# Patient Record
Sex: Female | Born: 1970 | Race: White | Hispanic: Yes | Smoking: Never smoker
Health system: Southern US, Community
[De-identification: ages and names within clinical notes are randomized; demographics above are authoritative.]

## PROBLEM LIST (undated history)

## (undated) DIAGNOSIS — I10 Essential (primary) hypertension: Secondary | ICD-10-CM

## (undated) DIAGNOSIS — G43909 Migraine, unspecified, not intractable, without status migrainosus: Secondary | ICD-10-CM

## (undated) HISTORY — PX: APPENDECTOMY: SHX54

## (undated) HISTORY — PX: BREAST ENHANCEMENT SURGERY: SHX7

## (undated) HISTORY — PX: DILATION AND CURETTAGE OF UTERUS: SHX78

## (undated) HISTORY — PX: EYE SURGERY: SHX253

## (undated) HISTORY — DX: Migraine, unspecified, not intractable, without status migrainosus: G43.909

---

## 1997-07-19 ENCOUNTER — Inpatient Hospital Stay (HOSPITAL_COMMUNITY): Admission: AD | Admit: 1997-07-19 | Discharge: 1997-07-23 | Payer: Self-pay | Admitting: Obstetrics & Gynecology

## 1997-07-29 ENCOUNTER — Ambulatory Visit (HOSPITAL_COMMUNITY): Admission: RE | Admit: 1997-07-29 | Discharge: 1997-07-29 | Payer: Self-pay | Admitting: Obstetrics & Gynecology

## 1997-10-31 ENCOUNTER — Inpatient Hospital Stay (HOSPITAL_COMMUNITY): Admission: AD | Admit: 1997-10-31 | Discharge: 1997-11-03 | Payer: Self-pay | Admitting: Obstetrics and Gynecology

## 1999-01-16 ENCOUNTER — Inpatient Hospital Stay (HOSPITAL_COMMUNITY): Admission: AD | Admit: 1999-01-16 | Discharge: 1999-01-19 | Payer: Self-pay | Admitting: Obstetrics and Gynecology

## 1999-01-16 ENCOUNTER — Encounter: Payer: Self-pay | Admitting: Obstetrics & Gynecology

## 1999-01-26 ENCOUNTER — Encounter: Admission: RE | Admit: 1999-01-26 | Discharge: 1999-01-26 | Payer: Self-pay | Admitting: Obstetrics & Gynecology

## 1999-02-03 ENCOUNTER — Encounter: Admission: RE | Admit: 1999-02-03 | Discharge: 1999-02-03 | Payer: Self-pay | Admitting: Obstetrics

## 1999-02-17 ENCOUNTER — Encounter: Admission: RE | Admit: 1999-02-17 | Discharge: 1999-02-17 | Payer: Self-pay | Admitting: Obstetrics

## 1999-03-03 ENCOUNTER — Encounter: Admission: RE | Admit: 1999-03-03 | Discharge: 1999-03-03 | Payer: Self-pay | Admitting: Obstetrics

## 1999-03-17 ENCOUNTER — Encounter: Admission: RE | Admit: 1999-03-17 | Discharge: 1999-03-17 | Payer: Self-pay | Admitting: Obstetrics

## 1999-03-31 ENCOUNTER — Encounter: Admission: RE | Admit: 1999-03-31 | Discharge: 1999-03-31 | Payer: Self-pay | Admitting: Obstetrics

## 1999-04-06 ENCOUNTER — Ambulatory Visit (HOSPITAL_COMMUNITY): Admission: RE | Admit: 1999-04-06 | Discharge: 1999-04-06 | Payer: Self-pay | Admitting: *Deleted

## 1999-04-14 ENCOUNTER — Encounter: Admission: RE | Admit: 1999-04-14 | Discharge: 1999-04-14 | Payer: Self-pay | Admitting: Obstetrics

## 1999-05-04 ENCOUNTER — Encounter: Admission: RE | Admit: 1999-05-04 | Discharge: 1999-05-04 | Payer: Self-pay | Admitting: Obstetrics & Gynecology

## 1999-05-04 ENCOUNTER — Encounter (HOSPITAL_COMMUNITY): Admission: RE | Admit: 1999-05-04 | Discharge: 1999-05-26 | Payer: Self-pay | Admitting: Obstetrics & Gynecology

## 1999-05-06 ENCOUNTER — Encounter: Admission: RE | Admit: 1999-05-06 | Discharge: 1999-05-06 | Payer: Self-pay | Admitting: Internal Medicine

## 1999-05-11 ENCOUNTER — Encounter: Admission: RE | Admit: 1999-05-11 | Discharge: 1999-05-11 | Payer: Self-pay | Admitting: Obstetrics & Gynecology

## 1999-05-17 ENCOUNTER — Inpatient Hospital Stay (HOSPITAL_COMMUNITY): Admission: AD | Admit: 1999-05-17 | Discharge: 1999-05-17 | Payer: Self-pay | Admitting: *Deleted

## 1999-05-26 ENCOUNTER — Inpatient Hospital Stay (HOSPITAL_COMMUNITY): Admission: AD | Admit: 1999-05-26 | Discharge: 1999-05-29 | Payer: Self-pay | Admitting: Obstetrics & Gynecology

## 1999-05-26 ENCOUNTER — Encounter: Admission: RE | Admit: 1999-05-26 | Discharge: 1999-05-26 | Payer: Self-pay | Admitting: Obstetrics

## 1999-06-01 ENCOUNTER — Observation Stay (HOSPITAL_COMMUNITY): Admission: AD | Admit: 1999-06-01 | Discharge: 1999-06-02 | Payer: Self-pay | Admitting: Obstetrics

## 1999-07-13 ENCOUNTER — Other Ambulatory Visit: Admission: RE | Admit: 1999-07-13 | Discharge: 1999-07-13 | Payer: Self-pay | Admitting: Gynecology

## 2000-12-12 ENCOUNTER — Other Ambulatory Visit: Admission: RE | Admit: 2000-12-12 | Discharge: 2000-12-12 | Payer: Self-pay | Admitting: Gynecology

## 2001-04-11 ENCOUNTER — Encounter: Payer: Self-pay | Admitting: Emergency Medicine

## 2001-04-11 ENCOUNTER — Emergency Department (HOSPITAL_COMMUNITY): Admission: EM | Admit: 2001-04-11 | Discharge: 2001-04-11 | Payer: Self-pay | Admitting: Emergency Medicine

## 2001-11-14 ENCOUNTER — Other Ambulatory Visit: Admission: RE | Admit: 2001-11-14 | Discharge: 2001-11-14 | Payer: Self-pay | Admitting: Gynecology

## 2006-01-02 ENCOUNTER — Other Ambulatory Visit: Admission: RE | Admit: 2006-01-02 | Discharge: 2006-01-02 | Payer: Self-pay | Admitting: Gynecology

## 2006-06-05 ENCOUNTER — Inpatient Hospital Stay (HOSPITAL_COMMUNITY): Admission: AD | Admit: 2006-06-05 | Discharge: 2006-06-05 | Payer: Self-pay | Admitting: Obstetrics and Gynecology

## 2006-07-28 ENCOUNTER — Inpatient Hospital Stay (HOSPITAL_COMMUNITY): Admission: AD | Admit: 2006-07-28 | Discharge: 2006-07-28 | Payer: Self-pay | Admitting: Obstetrics and Gynecology

## 2006-08-07 ENCOUNTER — Inpatient Hospital Stay (HOSPITAL_COMMUNITY): Admission: AD | Admit: 2006-08-07 | Discharge: 2006-08-11 | Payer: Self-pay | Admitting: Obstetrics and Gynecology

## 2006-08-12 ENCOUNTER — Encounter: Admission: RE | Admit: 2006-08-12 | Discharge: 2006-09-10 | Payer: Self-pay | Admitting: Obstetrics and Gynecology

## 2006-08-23 ENCOUNTER — Inpatient Hospital Stay (HOSPITAL_COMMUNITY): Admission: AD | Admit: 2006-08-23 | Discharge: 2006-08-23 | Payer: Self-pay | Admitting: Obstetrics and Gynecology

## 2006-09-11 ENCOUNTER — Encounter: Admission: RE | Admit: 2006-09-11 | Discharge: 2006-10-11 | Payer: Self-pay | Admitting: Obstetrics and Gynecology

## 2006-10-12 ENCOUNTER — Encounter: Admission: RE | Admit: 2006-10-12 | Discharge: 2006-11-10 | Payer: Self-pay | Admitting: Obstetrics and Gynecology

## 2006-11-11 ENCOUNTER — Encounter: Admission: RE | Admit: 2006-11-11 | Discharge: 2006-12-11 | Payer: Self-pay | Admitting: Obstetrics and Gynecology

## 2006-12-12 ENCOUNTER — Encounter: Admission: RE | Admit: 2006-12-12 | Discharge: 2007-01-11 | Payer: Self-pay | Admitting: Obstetrics and Gynecology

## 2007-01-12 ENCOUNTER — Encounter: Admission: RE | Admit: 2007-01-12 | Discharge: 2007-02-06 | Payer: Self-pay | Admitting: Obstetrics and Gynecology

## 2008-02-13 ENCOUNTER — Encounter: Admission: RE | Admit: 2008-02-13 | Discharge: 2008-02-13 | Payer: Self-pay | Admitting: Internal Medicine

## 2008-02-13 IMAGING — CR DG HAND COMPLETE 3+V*R*
3 series · 3 of 3 positions shown · non-contrast
Comparison: None available

CLINICAL DATA: Pain and swelling

RIGHT HAND - COMPLETE 3+ VIEW

[view not recorded (1 of 3)]
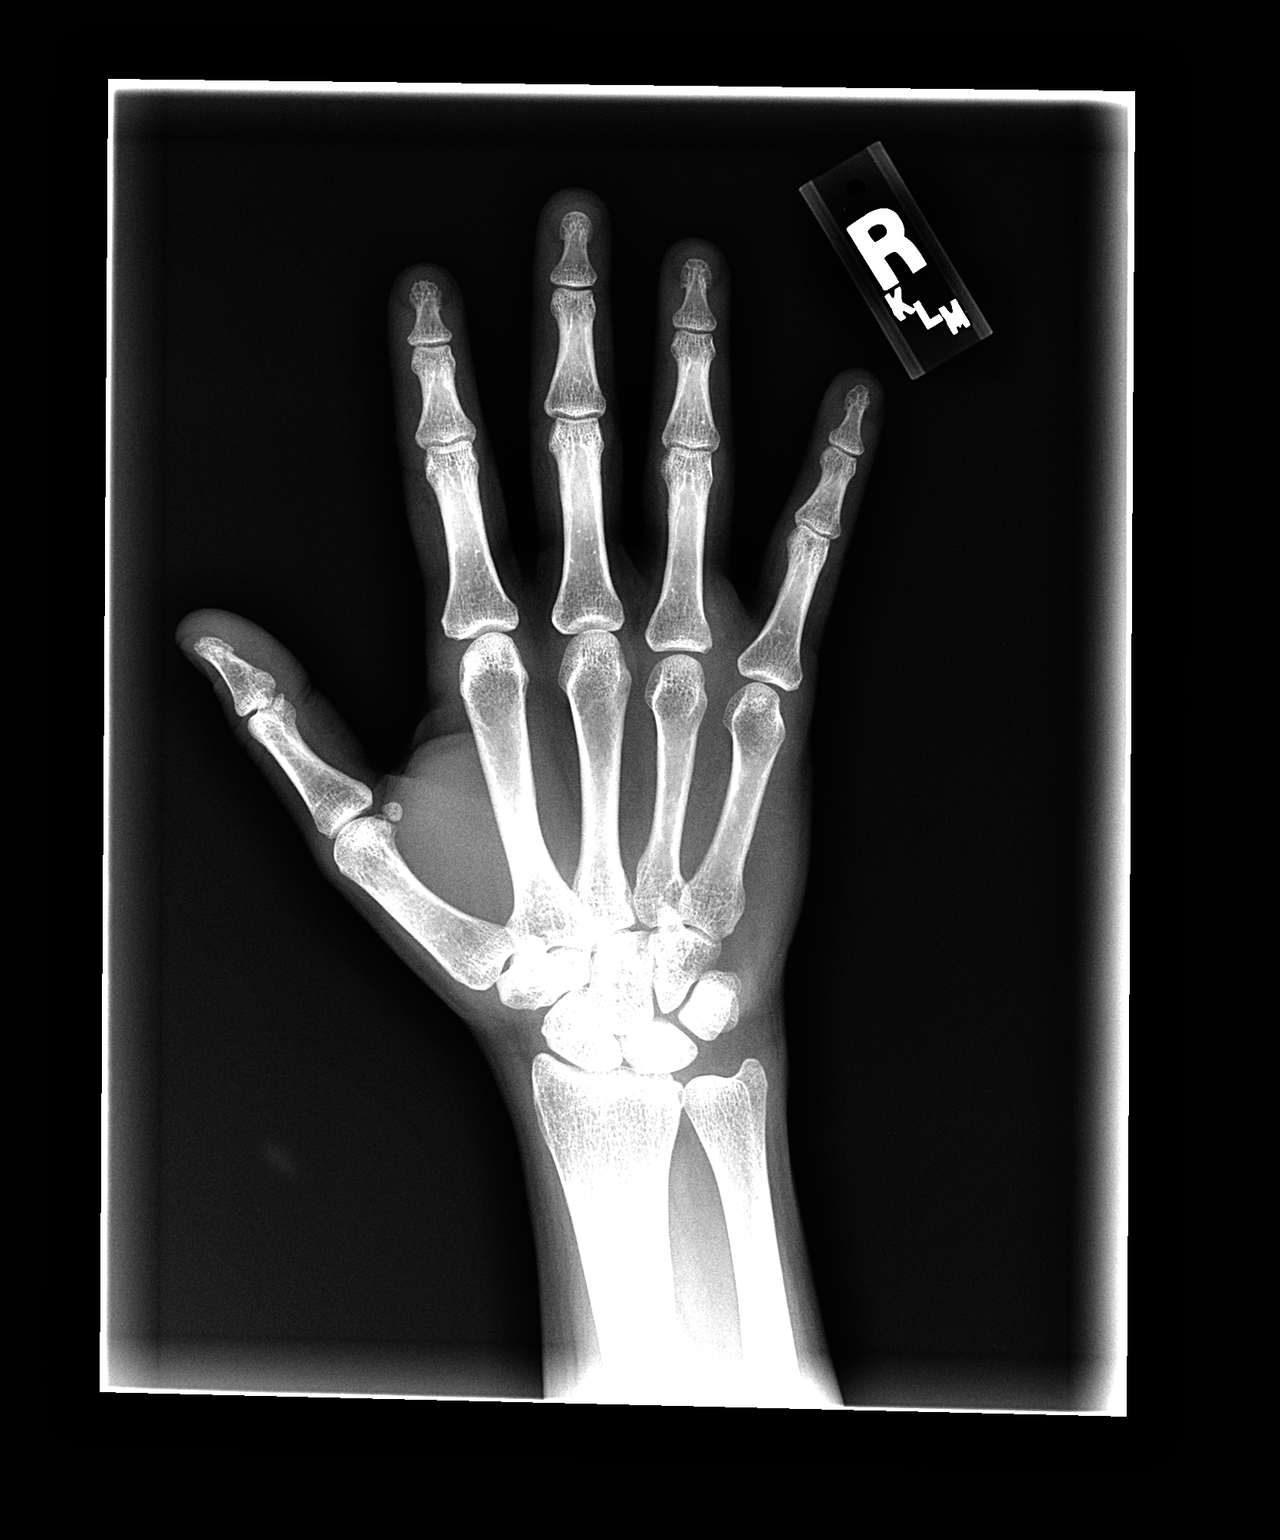

[view not recorded (2 of 3)]
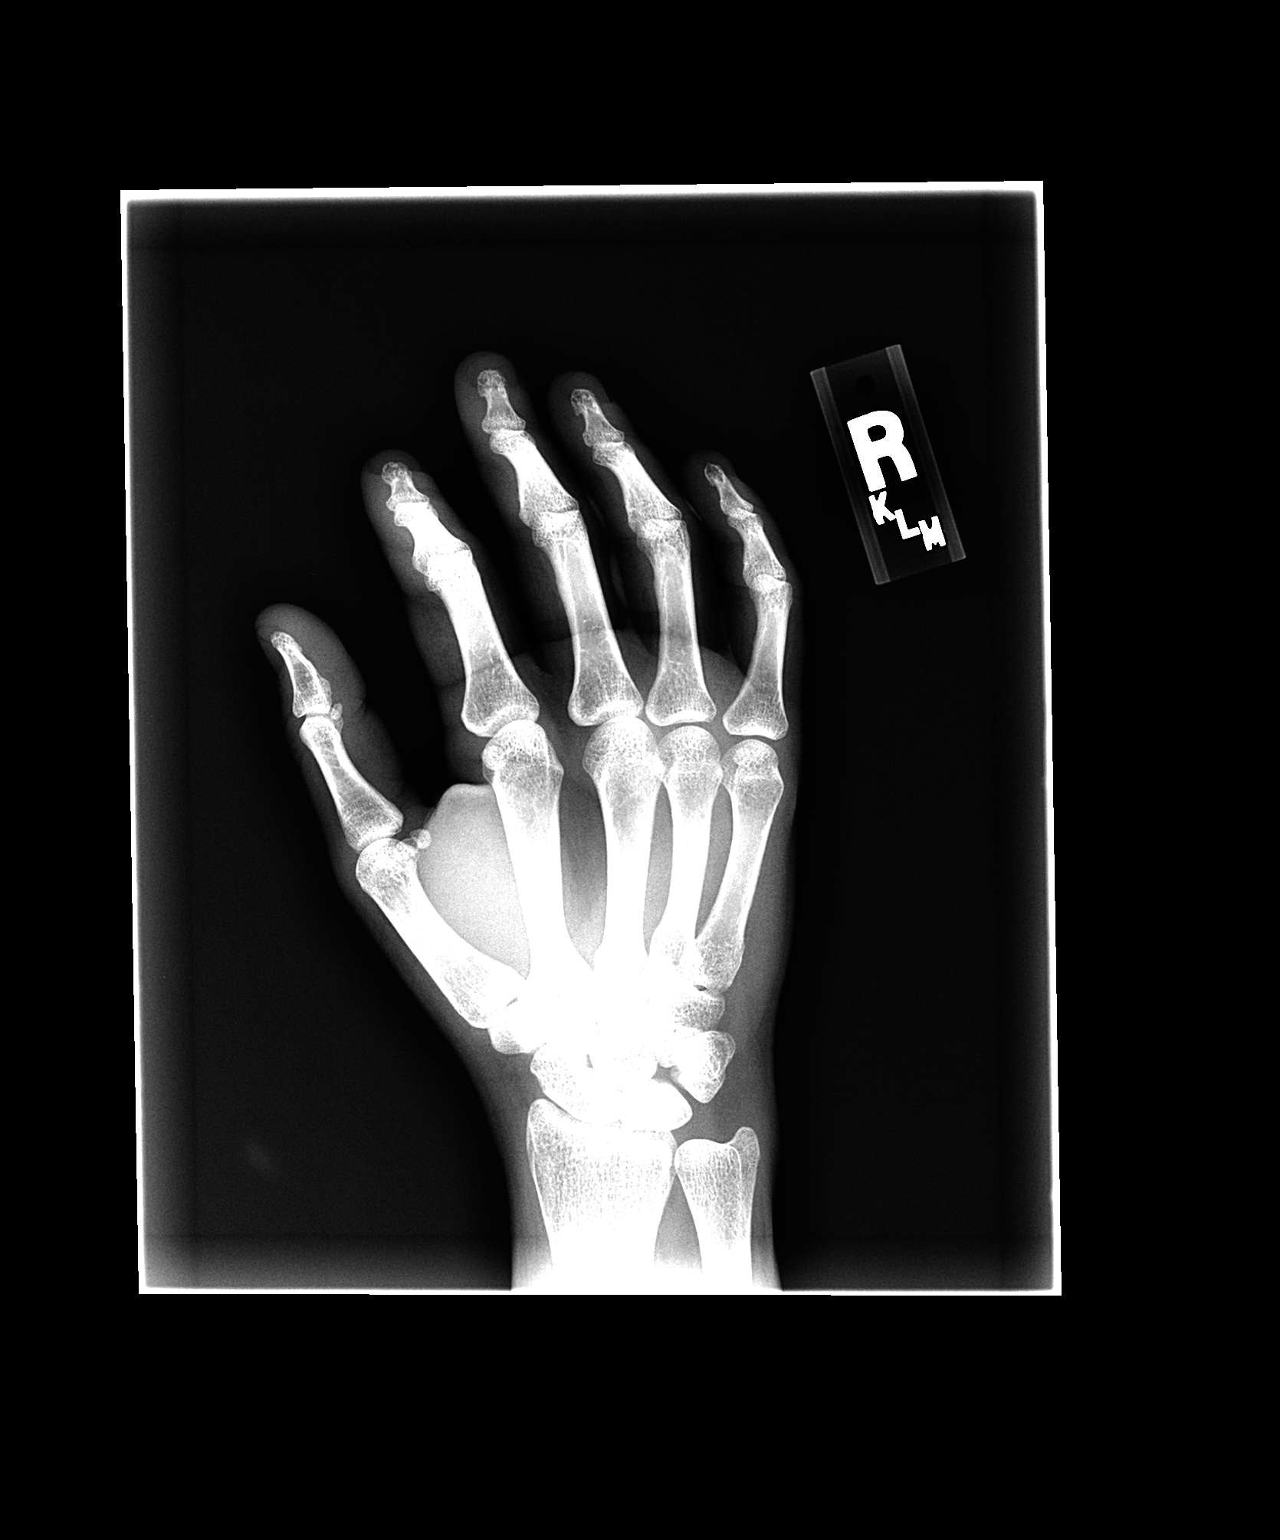

[view not recorded (3 of 3)]
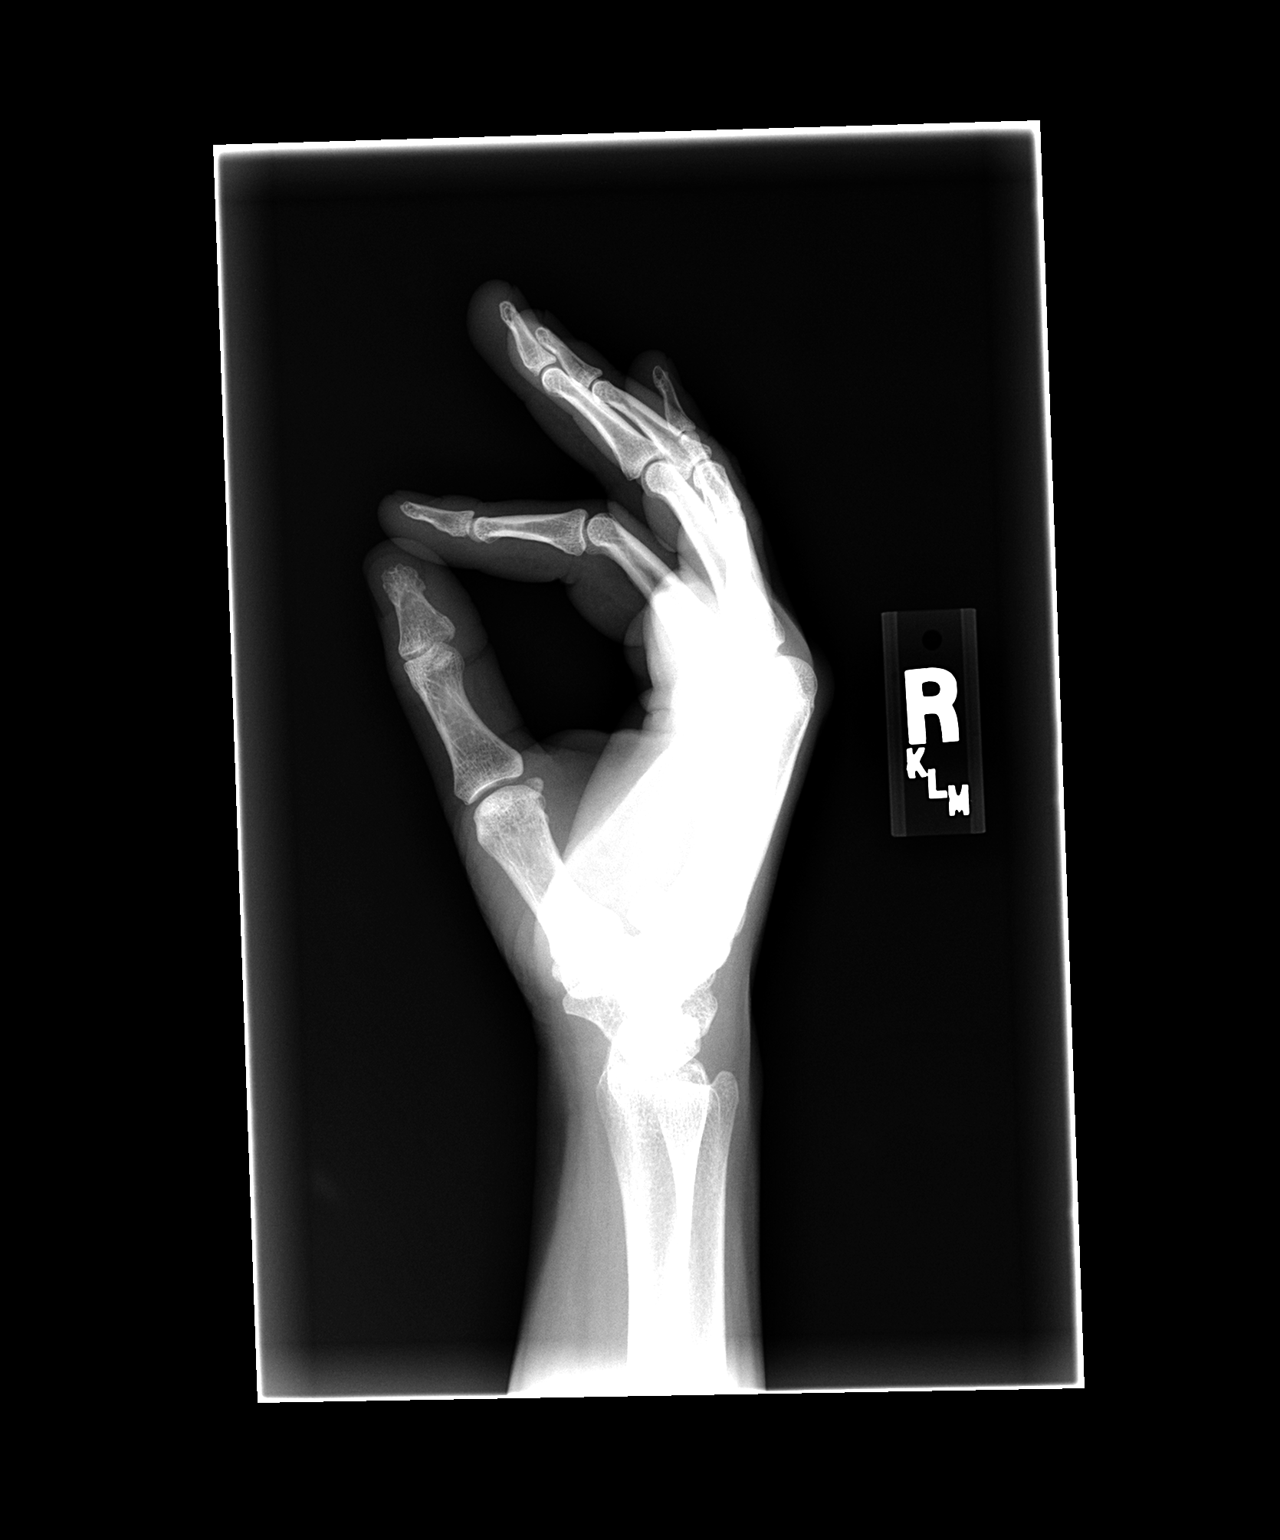

[3 of 3 positions shown; findings below may reference images not displayed]

FINDINGS: Negative for fracture, dislocation, or other acute
abnormality.  Normal alignment and mineralization. No significant
degenerative change.  Regional soft tissues unremarkable.
IMPRESSION: Negative

## 2008-02-18 ENCOUNTER — Encounter (INDEPENDENT_AMBULATORY_CARE_PROVIDER_SITE_OTHER): Payer: Self-pay | Admitting: Orthopedic Surgery

## 2008-02-18 ENCOUNTER — Ambulatory Visit (HOSPITAL_BASED_OUTPATIENT_CLINIC_OR_DEPARTMENT_OTHER): Admission: RE | Admit: 2008-02-18 | Discharge: 2008-02-18 | Payer: Self-pay | Admitting: Orthopedic Surgery

## 2008-04-15 ENCOUNTER — Emergency Department (HOSPITAL_BASED_OUTPATIENT_CLINIC_OR_DEPARTMENT_OTHER): Admission: EM | Admit: 2008-04-15 | Discharge: 2008-04-15 | Payer: Self-pay | Admitting: Emergency Medicine

## 2008-04-15 ENCOUNTER — Ambulatory Visit: Payer: Self-pay | Admitting: Radiology
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
## 2010-09-13 NOTE — Op Note (Signed)
Sharon Friedman, Sharon Friedman             ACCOUNT NO.:  [id number]   MEDICAL RECORD NO.:  [id number]          PATIENT TYPE:  AMB   LOCATION:  DSC                          FACILITY:  [facility]   PHYSICIAN:  Katy Fitch. Sypher, M.D. DATE OF BIRTH:  [birth year]   DATE OF PROCEDURE:  02/18/2008  DATE OF DISCHARGE:                               OPERATIVE REPORT   PREOPERATIVE DIAGNOSIS:  Pain and swelling, right palm and dorsal aspect  of ring small finger web space consistent with either crystal  arthropathy/synovitis or collar button abscess of ring small web.   POSTOPERATIVE DIAGNOSIS:  Probable collar button abscess of right hand  between ring and small finger metacarpal neck.   OPERATION:  1. Incision and drainage of right palm web between ring and small      finger metacarpals with identification of purulent material at      level of A1 pulley and intermetacarpal ligament.  2. Incision of dorsal web with through-and-through silicone vessel      loop drain with cultures obtained for aerobic and anaerobic growth      and a swab placed in absolute alcohol for a crystal examination to      rule out calcium pyrophosphate deposition disease.   OPERATIONS:  Katy Fitch. Sypher, MD   ASSISTANT:  Marveen Reeks Dasnoit, PA   ANESTHESIA:  General by LMA.   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   Over the prior weekend approximately 4 days prior to presentation, she  noted bruising and swelling of her right palm and dorsal aspect of her  ring metacarpal.   She could recall no antecedent injury.   She had no history of recent infection, no history of fever, and no  history of any type of penetrating injury to the hand recently or  remotely.   She was seen for evaluation and thought to have either a crystal  arthropathy or a possible web infection.  She  had no risk factors for  infection to our knowledge.  She was sent for lab studies on February 14, 2008, including a CBC with differential, which revealed a white count of  8500 and a normal differential.  A uric acid of 3.7 and sedimentation  rate  that was noted be 29 with upper limit of normal 20.  She return  for followup evaluation on February 17, 2008, and while still afebrile,  had persistent localize swelling.   She had been placed on colchicine on February 14, 2008, and did not  really have a dramatic change in her presentation.  Given the  circumstance that clinical gout was likely rule out and a pseudogout was  rather unlikely, we would recommend proceeding to incision and drainage  of her hand for cultures on the outside chance this was spontaneous  collar button abscess.   She is brought to the operating at this time anticipating incision and  drainage of her wound to address possible infection as well to  obtain  cultures and smears.  Preoperatively, questions were invited and  answered in detail.   She has had no antibiotic exposure in the 48 hours prior to surgery.   PROCEDURE:  Sharon Friedman was brought to the operating room and placed  in supine position on the operating table.   Following the induction of general anesthesia by LMA technique, the  right arm was prepped with Betadine soap solution and sterilely draped.  A pneumatic tourniquet was applied to the proximal right brachium.   Following elevation of the arm for 1 minute, the arterial tourniquet was  inflated to 220 mmHg.  The procedure commenced with a transverse  incision directly over the area of maximum firmness distal to the distal  palmar crease between the ring and small finger metacarpal heads.   The superficial fascia was incised and an area of firm induration noted.  This was entered with blunt technique and immediately a marble size  abscess cavity was encountered along the ulnar aspect of the  ring finger  flexor sheath extending into the intermetacarpal region near the  intermetacarpal ligament.  This was gently probed with a blunt  instrument and purulent material was recovered for aerobic and anaerobic  cultures.  Swabs were placed in the appropriate transport media.  A  third swab was used to remove some of the purulent material and placed  in absolute alcohol for a touch prep/smear to rule out a calcium  pyrophosphate crystal disorder.   A blunt hemostat was used to probe between the interosseous muscles to  the dorsum of the hand where there was rubor and a small incision was  fashioned allowing through-and-through passage of two strips of silicone  vessel loop drain.   These were looped for security.  The subcutaneous tissues were gently  probed with a blunt hemostat to ensure drainage of all areas of  loculation.   The wound was then dressed with Adaptic, sterile gauze, sterile Kerlix,  and an Ace wrap.  There were no apparent complications.   Sharon Friedman was transferred to the recovery room with stable vital  signs.   She will be discharged with prescriptions for Percocet 5 mg 1 p.o. 4-6  hours p.r.n. pain, 20 tablets without refill and Keflex 500 mg 1 p.o.  q.8 h. x5 days as a possible therapeutic antibiotic.   After the cultures were obtained, 1 g of Ancef was administered as an IV  prophylactic antibiotic.      Katy Fitch Sypher, M.D.  Electronically Signed     RVS/MEDQ  D:  02/18/2008  T:  02/18/2008  Job:  [id number]   cc:   Willow Ora, MD

## 2010-09-16 NOTE — Discharge Summary (Signed)
[facility]  Patient:    Sharon Friedman Visit Number: [id number] MRN: [id number]          Service Type: OBS Location: 9300 [id number] Attending Physician:  Antionette Char Dictated by:   Tami Lin, M.D. Admit Date:  05/26/1999 Discharge Date: 05/29/1999                             Discharge Summary  DISCHARGE DIAGNOSES:          1. Status post normal spontaneous vaginal delivery.                               2. 39-3/7 weeks intrauterine pregnancy.                               3. Delivery of a viable female infant.                               4. Induction of labor secondary to pregnancy-induced                                  hypertension with normal labs.                               5. Blood type O negative.  HOSPITAL COURSE:              This is a 40 year old, gravida 3, para 2-0-1-2, who was admitted at 39-3/7 weeks for induction of labor secondary to [facility]. She had normal PIH labs here on admission and therefore no magnesium was needed.  Her blood pressures were stable during delivery.  She delivered a viable female infant with  Apgars of 8 at one minute and 8 at five minutes with no complications.  On the ay of discharge, the patient was doing much better, was able to ambulate without assistance and tolerate p.o.  The patient is planning on breastfeeding and will use Depo for birth control.  LABORATORY DATA:              H&H on admission 13.2 and 37.6.  At discharge 12.4 and 35.9.  White count 13, platelets 204.  PIH labs normal except for albumin low at 3.0, alkaline phosphatase mildly elevated at 144.  DISCHARGE INSTRUCTIONS:       Discharged the patient to home on May 29, 1999, with follow-up at [facility] in six weeks.  Diet as tolerated.  Activity pelvic rest x 6 weeks.  DISCHARGE MEDICATIONS:        1. Motrin 800 mg one p.o. q.6h. p.r.n. pain.                               2. Continue prenatal vitamins  until follow-up visit.   FOLLOW-UP:                    Follow up at [facility] in six weeks. Dictated by:   Tami Lin, M.D. Attending Physician:  Antionette Char DD:  05/29/99 TD:  05/30/99 Job: [id number]

## 2010-09-16 NOTE — H&P (Signed)
NAMELENNOX, LEIKAM             ACCOUNT NO.:  [id number]   MEDICAL RECORD NO.:  [id number]          PATIENT TYPE:  INP   LOCATION:  9170                          FACILITY:  [facility]   PHYSICIAN:  Osborn Coho, M.D.   DATE OF BIRTH:  12-24-1970   DATE OF ADMISSION:  08/07/2006  DATE OF DISCHARGE:                              HISTORY & PHYSICAL   HISTORY OF PRESENT ILLNESS:  Ms. Nicholl is a 40 year old, married  female, gravida 4 para 2-0-1-2 at 89 weeks who presents from the office  for evaluation of elevated blood pressure.  She denies leaking or  bleeding, no headache or visual disturbances. She reports irregular  contractions. Cervix was 2 cm and 70% effaced and vertex in the office.  Clean catch urinalysis in the office showed no protein. Her pregnancy  has been followed by the [facility] M.D. service and has  been remarkable for:  1. History of preterm labor with term delivery;  2. Questionable chronic hypertension; 3. Advanced maternal age; 21.  History of incompetent cervix; 5. Group B Streptococcus negative; 6. Rh  negative.  She has been taking labetalol and Aldomet regularly during  the pregnancy.  Upon arrival to maternity admission she received 400 mg  of labetalol p.o.  Prenatal labs were collected on 02/08/06. Hemoglobin  was 13.1, hematocrit 39.0, platelets 223,000, blood type O negative,  antibody negative, sickle cell trait negative. RPR nonreactive. Rubella  immune. Hepatitis B surface antigen negative.  Pap smear within normal  limits.  Gonorrhea negative, Chlamydia negative, cystic fibrosis  negative.  One hour Glucola from 06/01/06 and was within normal limits.  Hemoglobin at that time was 11.8. Culture of the vaginal tract for Group  B Streptococcus on 07/19/06 was negative.   HISTORY OF PRESENT PREGNANCY:  She presented for care at [facility] on 02/08/06 at 12-5/[redacted] weeks gestation.  Nuchal translucency  ultrasound was done at this first visit  and it was within normal limits.  Her cervical length was also normal. The full first trimester screen was  also normal.  The patient declined amniocentesis. Her Down Syndrome risk  was 1:4321.  She was started on Aldomet at [redacted] weeks gestation for  elevated blood pressure.  She was also started on 17P injection due to  her history of preterm labor.  Ultrasonography at 18-1/[redacted] weeks gestation  shows growth consistent with previous dating, confirming ECD of 08/21/06.  Her Aldomet was increased to 500 mg b.i.d. at [redacted] weeks gestation.  The  patient received RhoGAM at [redacted] weeks gestation.  Ultrasonography at [redacted]  weeks gestation showed estimated fetal weight at the 46th percentile  with normal fluid.  At 34 weeks the patient's blood pressure medication  was changed to labetalol 300 mg b.i.d.  The patient had an evaluation  for headache and blurry vision at 34-1/5 weeks. Her workup was within  normal limits.  Ultrasonography at 35 weeks shows normal fluid, BPP 8/8.  The patient had regular antenatal testing due to the hypertension.  She  remained negative for proteinuria and measured size equal to dates  throughout the pregnancy.  OBSTETRICAL HISTORY:  She is gravida 4 para 2-0-1-2.  In 1997, she had a  first trimester SAB and in 1999 she had a vaginal delivery of a female  infant weighing 7 pounds 6 ounces at [redacted] weeks gestation after 24 hours  in labor. She had an epidural for anesthesia. The infant's name was  Alexa. She had elevated blood pressure. She also had preterm labor at  five months with short cervix.  In 2001 she had a vaginal delivery of a  female infant weighing 7 pounds and 2 ounces at [redacted] weeks gestation after  four hours in labor.  She had an epidural for anesthesia. The infant's  name was Astolfo. She also had elevated blood pressure, preterm labor at  five months with short cervix. This fourth pregnancy is the present  pregnancy.   PAST MEDICAL HISTORY:  She has no medication  allergies. She reports  questionable thin cervix at five months. Reports pregnancy-induced  hypertension with both pregnancies. She received RhoGAM with previous  pregnancies.  She has used Depo-Provera in the past for contraception.  She reports having had the usual childhood illnesses. She has an  occasional cystitis.   PAST SURGICAL HISTORY:  Remarkable for a cyst removal on her hand at age  77, appendectomy at age 42, breast implants at age 56 and SAB in 33.   FAMILY HISTORY:  The patient's sister has lupus.  Brother born with one  kidney.   GENETIC HISTORY:  Remarkable for patient age 45, father is age 40.   SOCIAL HISTORY:  The patient is married to the father of the baby.  His  name is Tinnie Gens. He is involved and supportive. They are of the Catholic  faith.  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 Father of the baby has 18 years of education and is a full  time Art gallery manager.   OBJECTIVE DATA:  VITAL SIGNS:  Blood pressures are elevated at 170/103,  173/95, 182/103, 158/92. Other vital signs are stable. She is afebrile.  HEENT is grossly within normal limits.  CHEST: Clear to auscultation.  HEART: Regular rate and rhythm.  ABDOMEN:  Gravid and contour with fundal height extending approximately  38 cm above pubic symphysis.  Fetal heart rate is reactive and  reassuring. Contractions are at times every 5-7 minutes and then at  times they are more spaced out.  Cervix was 2 and 70% in the office  today.  EXTREMITIES:  Remarkable for trace edema with normal reflexes.   LABORATORY DATA:  Hemoglobin 13.3, hematocrit 38.8, white blood cell  count 11.3, platelet count 220,000.  SGOT 23, SGPT 18, LDH 139, uric  acid 5.3.  She had never protein on a clean catch specimen at the  office.   ASSESSMENT:  1. Intrauterine pregnancy at term.  2. Chronic hypertension with no evidence of preeclampsia.  3. Favorable cervix.   PLAN: 1. Admit to birthing suite  with Dr. Su Hilt.  2. Routine M.D. orders.  3. Plan Pitocin per low dose with labetalol given as needed.  4. Expect normal spontaneous vaginal birth.      Cam Hai, C.N.M.      Osborn Coho, M.D.  Electronically Signed    KS/MEDQ  D:  08/07/2006  T:  08/08/2006  Job:  [id number]

## 2011-01-31 LAB — WOUND CULTURE

## 2011-01-31 LAB — ANAEROBIC CULTURE

## 2011-01-31 LAB — POCT HEMOGLOBIN-HEMACUE: Hemoglobin: 14.2

## 2011-01-31 LAB — BASIC METABOLIC PANEL
BUN: 12
Chloride: 105
Glucose, Bld: 104 — ABNORMAL HIGH
Potassium: 4.8
Sodium: 138

## 2011-02-03 LAB — URINALYSIS, ROUTINE W REFLEX MICROSCOPIC
Bilirubin Urine: NEGATIVE
Glucose, UA: NEGATIVE mg/dL
Hgb urine dipstick: NEGATIVE
Ketones, ur: NEGATIVE mg/dL
Protein, ur: NEGATIVE mg/dL
pH: 7 (ref 5.0–8.0)

## 2011-02-03 LAB — URINE MICROSCOPIC-ADD ON

## 2011-02-03 LAB — DIFFERENTIAL
Basophils Relative: 1 % (ref 0–1)
Eosinophils Absolute: 0.1 10*3/uL (ref 0.0–0.7)
Eosinophils Relative: 1 % (ref 0–5)
Lymphs Abs: 1.6 10*3/uL (ref 0.7–4.0)
Monocytes Absolute: 0.6 10*3/uL (ref 0.1–1.0)
Monocytes Relative: 5 % (ref 3–12)
Neutrophils Relative %: 79 % — ABNORMAL HIGH (ref 43–77)

## 2011-02-03 LAB — BASIC METABOLIC PANEL
CO2: 28 mEq/L (ref 19–32)
Chloride: 102 mEq/L (ref 96–112)
Creatinine, Ser: 0.6 mg/dL (ref 0.4–1.2)
GFR calc Af Amer: >60 mL/min (ref >60)

## 2011-02-03 LAB — CBC
HCT: 45.2 % (ref 36.0–46.0)
Hemoglobin: 15.2 g/dL — ABNORMAL HIGH (ref 12.0–15.0)
MCHC: 33.6 g/dL (ref 30.0–36.0)
MCV: 93.1 fL (ref 78.0–100.0)
RBC: 4.86 MIL/uL (ref 3.87–5.11)
WBC: 11.3 10*3/uL — ABNORMAL HIGH (ref 4.0–10.5)

## 2011-05-09 ENCOUNTER — Inpatient Hospital Stay (HOSPITAL_COMMUNITY): Payer: BC Managed Care – PPO

## 2011-05-09 ENCOUNTER — Inpatient Hospital Stay (HOSPITAL_COMMUNITY)
Admission: AD | Admit: 2011-05-09 | Discharge: 2011-05-09 | Disposition: A | Discharge status: Home / Self Care | Payer: BC Managed Care – PPO | Source: Ambulatory Visit | Attending: Obstetrics and Gynecology | Admitting: Obstetrics and Gynecology

## 2011-05-09 ENCOUNTER — Encounter (HOSPITAL_COMMUNITY): Payer: Self-pay | Admitting: *Deleted

## 2011-05-09 DIAGNOSIS — N938 Other specified abnormal uterine and vaginal bleeding: Secondary | ICD-10-CM | POA: Insufficient documentation

## 2011-05-09 DIAGNOSIS — R102 Pelvic and perineal pain: Secondary | ICD-10-CM

## 2011-05-09 DIAGNOSIS — N946 Dysmenorrhea, unspecified: Secondary | ICD-10-CM | POA: Insufficient documentation

## 2011-05-09 DIAGNOSIS — N949 Unspecified condition associated with female genital organs and menstrual cycle: Secondary | ICD-10-CM

## 2011-05-09 HISTORY — DX: Essential (primary) hypertension: I10

## 2011-05-09 LAB — CBC
HCT: 37 % (ref 36.0–46.0)
Hemoglobin: 12.5 g/dL (ref 12.0–15.0)
MCHC: 33.8 g/dL (ref 30.0–36.0)
WBC: 8.9 10*3/uL (ref 4.0–10.5)

## 2011-05-09 LAB — URINALYSIS, ROUTINE W REFLEX MICROSCOPIC
Glucose, UA: NEGATIVE mg/dL
Leukocytes, UA: NEGATIVE
Protein, ur: NEGATIVE mg/dL
Specific Gravity, Urine: 1.02 (ref 1.005–1.030)
Urobilinogen, UA: 0.2 mg/dL (ref 0.0–1.0)

## 2011-05-09 LAB — POCT PREGNANCY, URINE: Preg Test, Ur: NEGATIVE

## 2011-05-09 IMAGING — US US PELVIS COMPLETE
1 series · 14 of 25 positions shown · non-contrast
Comparison: [DATE] CT.

CLINICAL DATA: Cramping and fever.

TRANSABDOMINAL AND TRANSVAGINAL ULTRASOUND OF PELVIS
TECHNIQUE: Both transabdominal and transvaginal ultrasound
examinations of the pelvis were performed. Transabdominal technique
was performed for global imaging of the pelvis including uterus,
ovaries, adnexal regions, and pelvic cul-de-sac.

[Series 1: us pelvis complete · 14 of 41 slices shown]
[im 1/41]
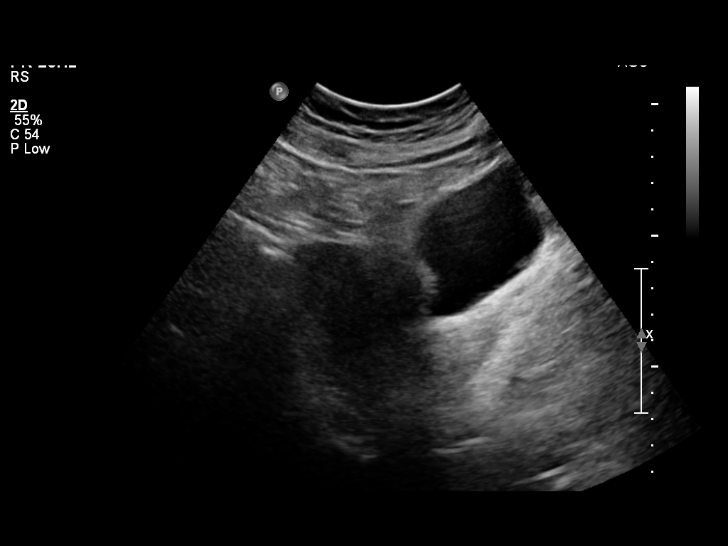
[im 4/41]
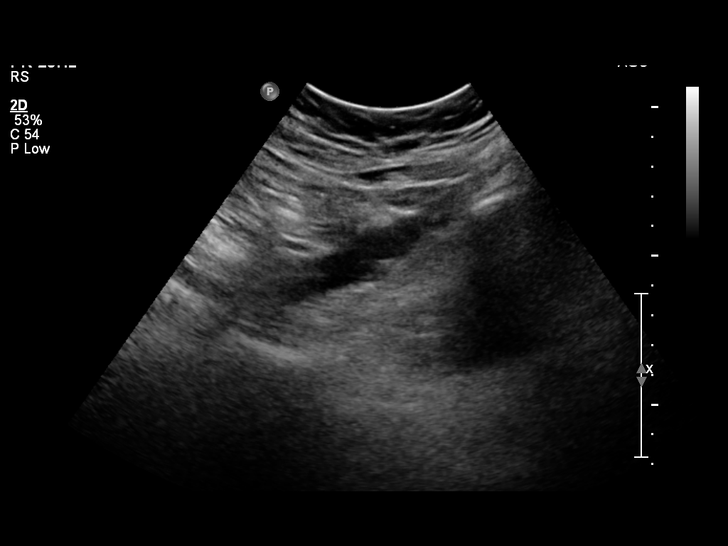
[im 7/41]
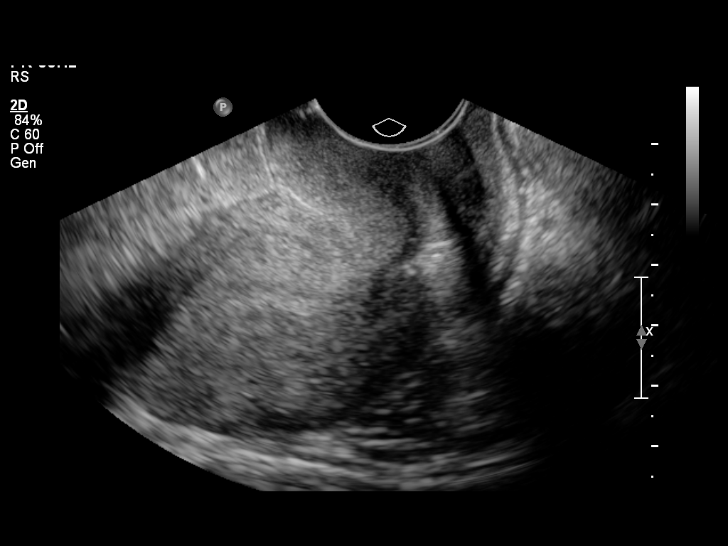
[im 11/41]
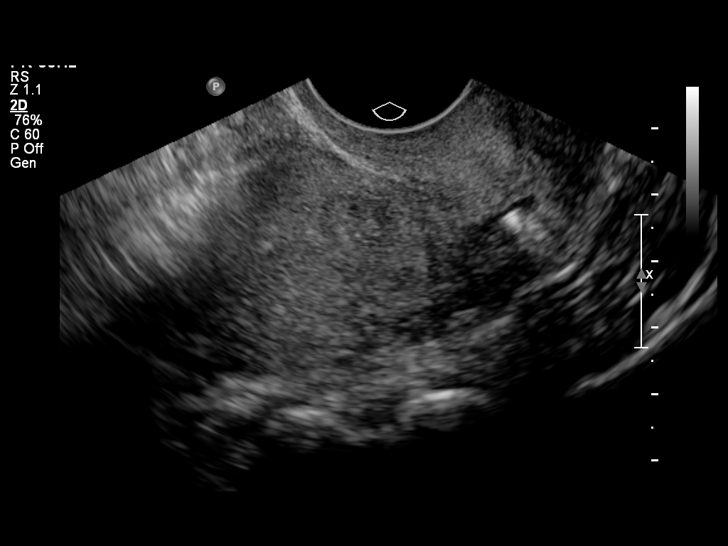
[im 14/41]
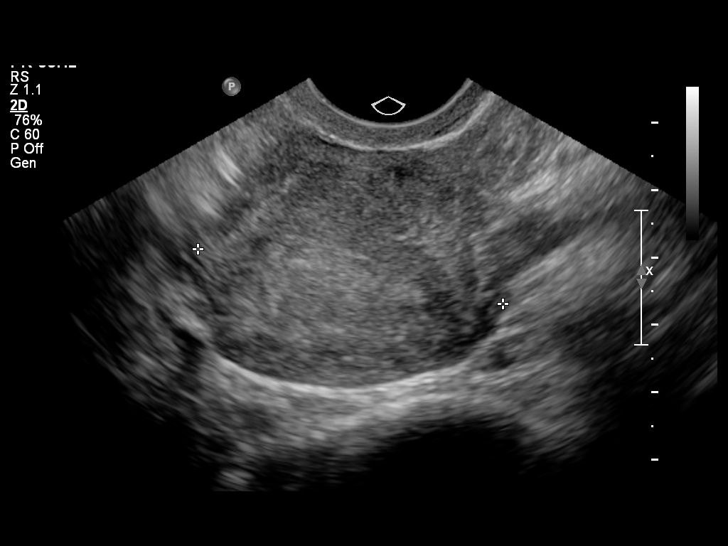
[im 16/41]
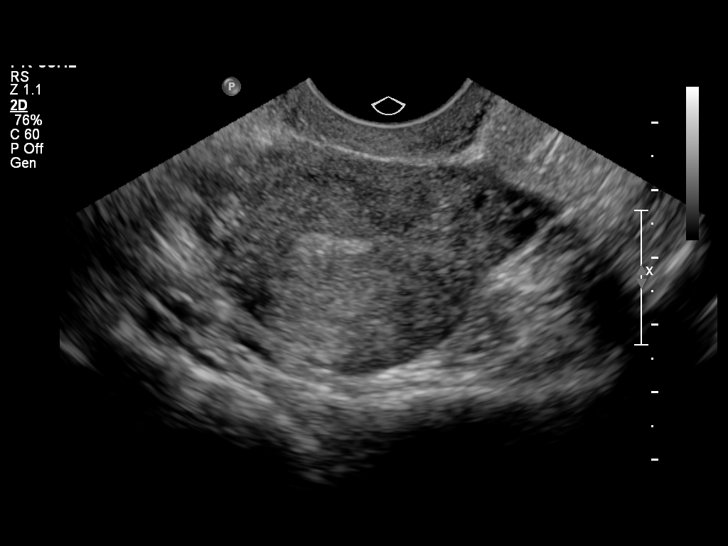
[im 19/41]
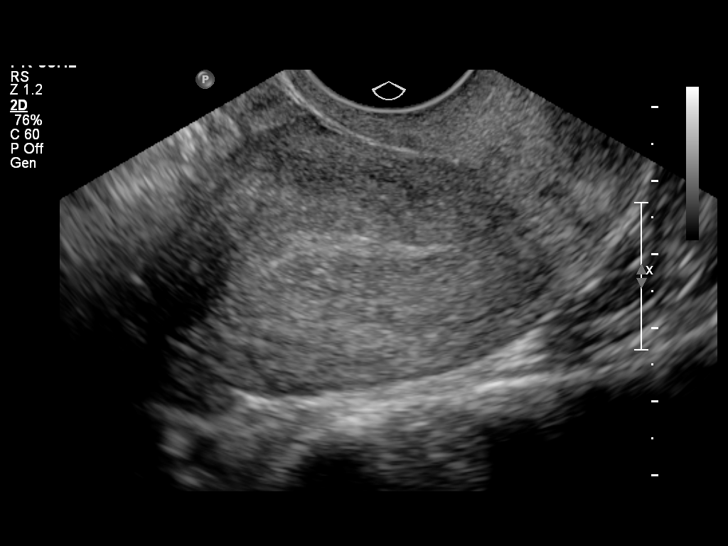
[im 22/41]
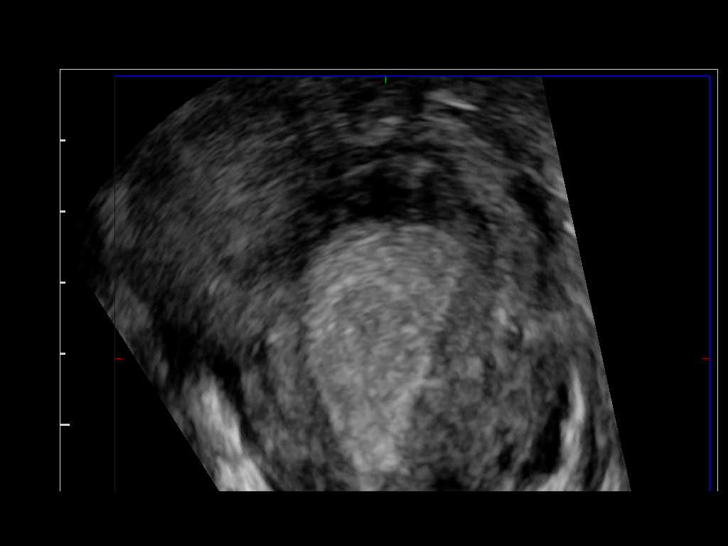
[im 26/41]
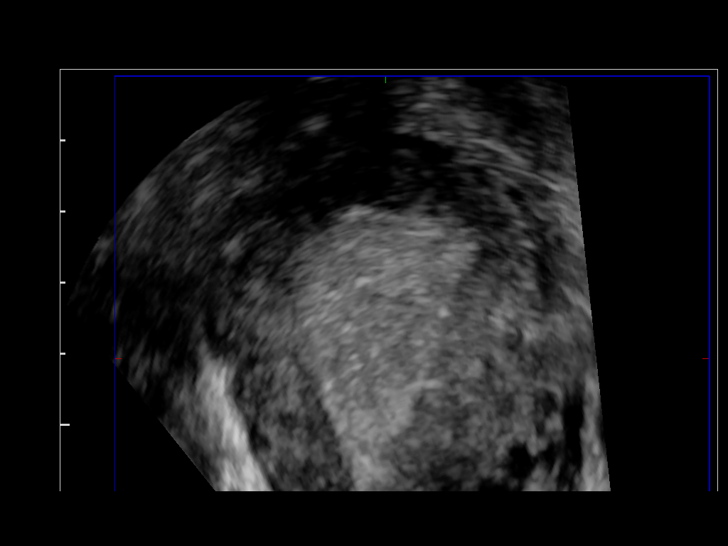
[im 27/41]
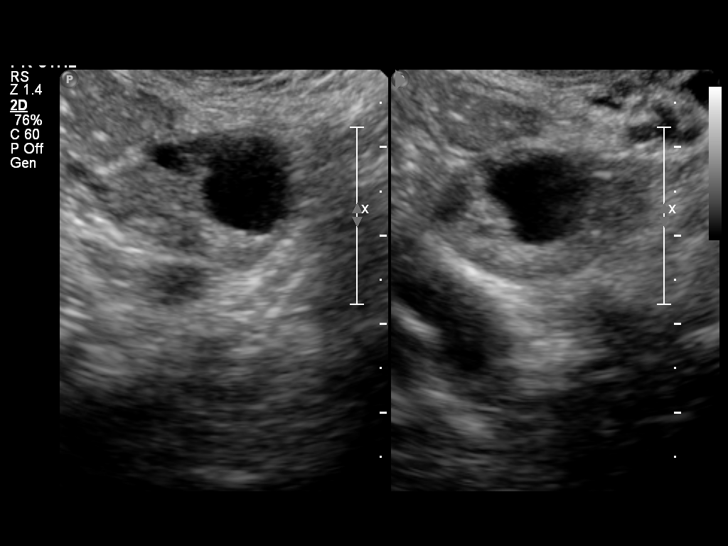
[im 31/41]
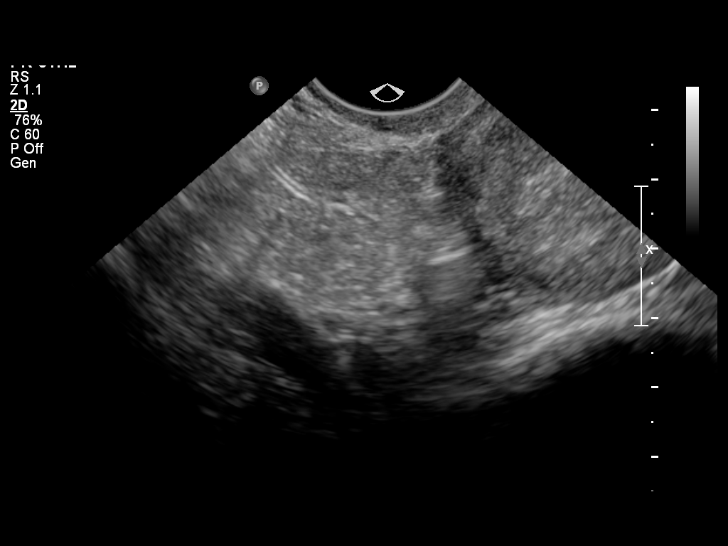
[im 34/41]
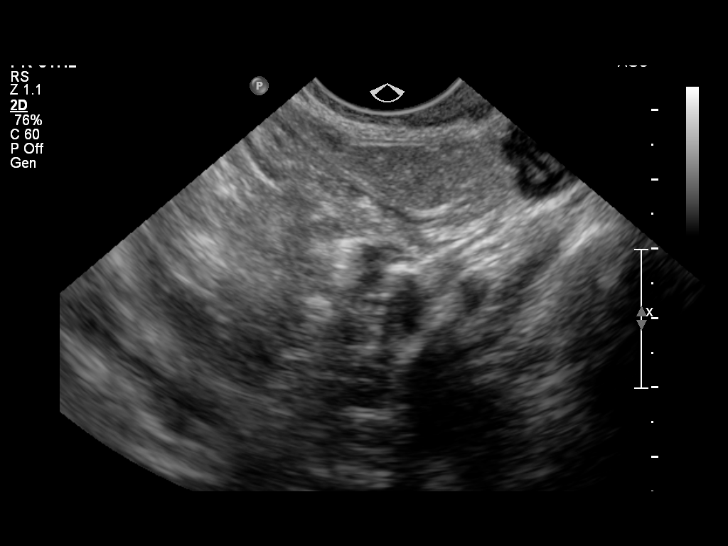
[im 37/41]
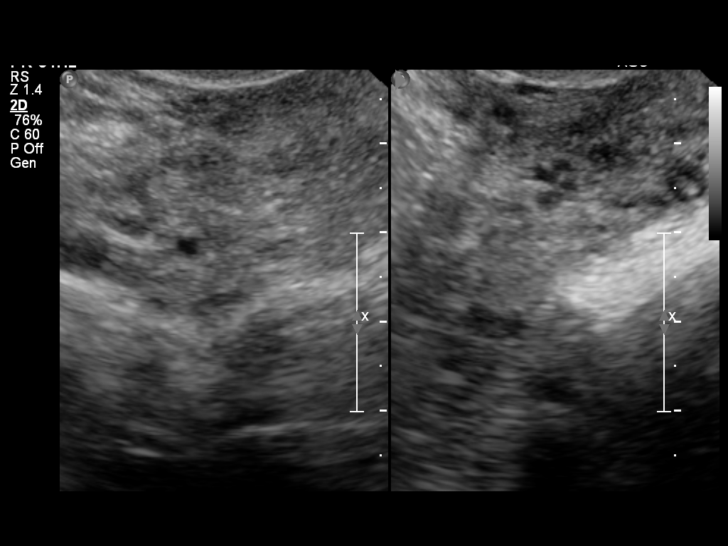
[im 41/41]
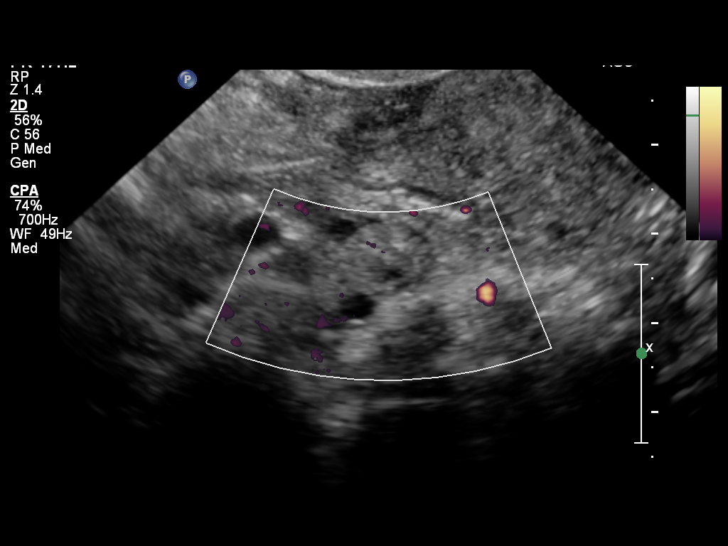

[14 of 25 positions shown; findings below may reference images not displayed]

It was necessary to proceed with endovaginal exam following the
transabdominal exam to visualize the uterus, endometrium and right
ovary...
FINDINGS: Uterus: 6.9 x 4.0 x 4.6 cm.  Mild heterogeneous echotexture without
focal mass.

Endometrium: 3.8 mm.

Right ovary:  Poorly delineated secondary to overlying bowel
measuring 1.8 x 0.9 x 1.3 cm.

Left ovary: 2.2 x 1.5 x 2.1 cm.  Small follicle incidentally noted.

Other findings: No free fluid
IMPRESSION: Right ovary is slightly suboptimally evaluated secondary to
overlying bowel gas.  Remainder of exam is unremarkable..

## 2011-05-09 MED ORDER — IBUPROFEN 200 MG PO TABS: 800.0000 mg | ORAL_TABLET | Freq: Three times a day (TID) | ORAL | Status: DC | PRN

## 2011-05-09 MED ORDER — KETOROLAC TROMETHAMINE 60 MG/2ML IM SOLN
60.0000 mg | Freq: Once | INTRAMUSCULAR | Status: AC
  Administered 2011-05-09: 60 mg via INTRAMUSCULAR
  Filled 2011-05-09: qty 2

## 2011-05-09 NOTE — ED Provider Notes (Signed)
History   Pt presents today c/o heavy vag bleeding and lower abd pain. She states she was concerned she could be pregnant. She denies vag irritation or any other sx at this time.  Chief Complaint  Patient presents with  . Abdominal Pain  . Back Pain   HPI  OB History    Grav Para Term Preterm Abortions TAB SAB Ect Mult Living   4 3 3  0 1 0 1 0 0 3      Past Medical History  Diagnosis Date  . Hypertension     Past Surgical History  Procedure Date  . Appendectomy   . Breast enhancement surgery     Family History  Problem Relation Age of Onset  . Hypertension Father     History  Substance Use Topics  . Smoking status: Never Smoker   . Smokeless tobacco: Never Used  . Alcohol Use: No    Allergies:  Allergies  Allergen Reactions  . Food Swelling    Walnut and hazelnut cause tongue "to break out in cuts" and tongue swells    Prescriptions prior to admission  Medication Sig Dispense Refill  . Ferrous Sulfate (PX IRON) 27 MG TABS Take 1 tablet by mouth daily.        Marland Kitchen ibuprofen (ADVIL,MOTRIN) 200 MG tablet Take 400 mg by mouth every 6 (six) hours as needed. For pain         Review of Systems  Constitutional: Positive for chills. Negative for fever and malaise/fatigue.  Cardiovascular: Negative for chest pain and palpitations.  Gastrointestinal: Positive for abdominal pain. Negative for nausea, vomiting, diarrhea and constipation.  Genitourinary: Negative for dysuria, urgency, frequency and hematuria.  Neurological: Negative for dizziness and headaches.  Psychiatric/Behavioral: Negative for depression and suicidal ideas.   Physical Exam   Blood pressure 183/100, pulse 67, temperature 98.9 F (37.2 C), temperature source Oral, resp. rate 18, height 5\' 1"  (1.549 m), weight 136 lb 12.8 oz (62.052 kg), last menstrual period 05/04/2011, SpO2 98.00%.  Physical Exam  Nursing note and vitals reviewed. Constitutional: She is oriented to person, place, and time. She  appears well-developed and well-nourished. No distress.  HENT:  Head: Normocephalic and atraumatic.  Eyes: EOM are normal. Pupils are equal, round, and reactive to light.  GI: Soft. She exhibits no distension and no mass. There is tenderness. There is no rebound and no guarding.  Genitourinary: No bleeding around the vagina. No vaginal discharge found.       Uterus tender to palpation. No obvious pelvic masses. Pt tender bilaterally.  Neurological: She is alert and oriented to person, place, and time.  Skin: Skin is warm and dry. She is not diaphoretic.  Psychiatric: She has a normal mood and affect. Her behavior is normal. Judgment and thought content normal.    MAU Course  Procedures  Discussed pt with Dr. Ambrose Mantle. Will get [place].  Assessment and Plan  Care of pt turned over to Georges Mouse, [place].  Clinton Gallant. Rice III, DrHSc, MPAS, PA-C  05/09/2011, 6:18 PM   Henrietta Hoover, PA 05/09/11 2007   Results for orders placed during the hospital encounter of 05/09/11 (from the past 48 hour(s))  URINALYSIS, ROUTINE W REFLEX MICROSCOPIC     Status: Normal   Collection Time   05/09/11  5:25 PM      Component Value Range Comment   Color, Urine YELLOW  YELLOW     APPearance CLEAR  CLEAR     Specific Gravity, Urine 1.020  1.005 - 1.030     pH 6.0  5.0 - 8.0     Glucose, UA NEGATIVE  NEGATIVE (mg/dL)    Hgb urine dipstick NEGATIVE  NEGATIVE     Bilirubin Urine NEGATIVE  NEGATIVE     Ketones, ur NEGATIVE  NEGATIVE (mg/dL)    Protein, ur NEGATIVE  NEGATIVE (mg/dL)    Urobilinogen, UA 0.2  0.0 - 1.0 (mg/dL)    Nitrite NEGATIVE  NEGATIVE     Leukocytes, UA NEGATIVE  NEGATIVE  MICROSCOPIC NOT DONE ON URINES WITH NEGATIVE PROTEIN, BLOOD, LEUKOCYTES, NITRITE, OR GLUCOSE <1000 mg/dL.  POCT PREGNANCY, URINE     Status: Normal   Collection Time   05/09/11  5:39 PM      Component Value Range Comment   Preg Test, Ur NEGATIVE     CBC     Status: Normal   Collection Time   05/09/11  5:50 PM       Component Value Range Comment   WBC 8.9  4.0 - 10.5 (K/uL)    RBC 3.95  3.87 - 5.11 (MIL/uL)    Hemoglobin 12.5  12.0 - 15.0 (g/dL)    HCT 47.8  29.5 - 62.1 (%)    MCV 93.7  78.0 - 100.0 (fL)    MCH 31.6  26.0 - 34.0 (pg)    MCHC 33.8  30.0 - 36.0 (g/dL)    RDW 30.8  65.7 - 84.6 (%)    Platelets 222  150 - 400 (K/uL)     US Transvaginal Non-ob  05/09/2011  *RADIOLOGY REPORT*  Clinical Data: Cramping and fever.  TRANSABDOMINAL AND TRANSVAGINAL ULTRASOUND OF PELVIS Technique:  Both transabdominal and transvaginal ultrasound examinations of the pelvis were performed. Transabdominal technique was performed for global imaging of the pelvis including uterus, ovaries, adnexal regions, and pelvic cul-de-sac.  Comparison: 04/15/2008 CT.   It was necessary to proceed with endovaginal exam following the transabdominal exam to visualize the uterus, endometrium and right ovary.  Findings:  Uterus: 6.9 x 4.0 x 4.6 cm.  Mild heterogeneous echotexture without focal mass.  Endometrium: 3.8 mm.  Right ovary:  Poorly delineated secondary to overlying bowel measuring 1.8 x 0.9 x 1.3 cm.  Left ovary: 2.2 x 1.5 x 2.1 cm.  Small follicle incidentally noted.  Other findings: No free fluid  IMPRESSION: Right ovary is slightly suboptimally evaluated secondary to overlying bowel gas.  Remainder of exam is unremarkable.  Original Report Authenticated By: Fuller Canada, M.D.   US Pelvis Complete  05/09/2011  *RADIOLOGY REPORT*  Clinical Data: Cramping and fever.  TRANSABDOMINAL AND TRANSVAGINAL ULTRASOUND OF PELVIS Technique:  Both transabdominal and transvaginal ultrasound examinations of the pelvis were performed. Transabdominal technique was performed for global imaging of the pelvis including uterus, ovaries, adnexal regions, and pelvic cul-de-sac.  Comparison: 04/15/2008 CT.   It was necessary to proceed with endovaginal exam following the transabdominal exam to visualize the uterus, endometrium and right ovary.  Findings:   Uterus: 6.9 x 4.0 x 4.6 cm.  Mild heterogeneous echotexture without focal mass.  Endometrium: 3.8 mm.  Right ovary:  Poorly delineated secondary to overlying bowel measuring 1.8 x 0.9 x 1.3 cm.  Left ovary: 2.2 x 1.5 x 2.1 cm.  Small follicle incidentally noted.  Other findings: No free fluid  IMPRESSION: Right ovary is slightly suboptimally evaluated secondary to overlying bowel gas.  Remainder of exam is unremarkable.  Original Report Authenticated By: Fuller Canada, M.D.   A/P: 41 y.o. [id number] with  dysmenorrhea F/U in office as scheduled on Thursday

## 2011-05-09 NOTE — Progress Notes (Signed)
N. Bascom Levels, CNM notified of elevated BP but better after Toradol and returning from [place].

## 2011-05-09 NOTE — Progress Notes (Signed)
Patient states the first day of her last period was 1-3, had heavy bleeding with clots unitl 1-6. Not having any bleeding at this time. Started having abdominal cramping when the bleeding started. Had a fever earlier today of 101 and took Ibuprofen. Normal temp in triage. Continues to have abdominal cramping, feels weak, lightheaded.

## 2011-05-15 ENCOUNTER — Telehealth (HOSPITAL_COMMUNITY): Payer: Self-pay | Admitting: Nurse Practitioner

## 2011-05-15 NOTE — Telephone Encounter (Signed)
Left message for patient to return call.

## 2014-03-02 ENCOUNTER — Encounter (HOSPITAL_COMMUNITY): Payer: Self-pay | Admitting: *Deleted

## 2014-07-13 ENCOUNTER — Ambulatory Visit (INDEPENDENT_AMBULATORY_CARE_PROVIDER_SITE_OTHER): Payer: Self-pay | Admitting: Family

## 2014-07-13 ENCOUNTER — Encounter: Payer: Self-pay | Admitting: Family

## 2014-07-13 VITALS — BP 220/100 | HR 58 | Temp 98.3°F | Resp 18 | Ht 61.0 in | Wt 133.0 lb

## 2014-07-13 DIAGNOSIS — I10 Essential (primary) hypertension: Secondary | ICD-10-CM

## 2014-07-13 DIAGNOSIS — G43909 Migraine, unspecified, not intractable, without status migrainosus: Secondary | ICD-10-CM | POA: Insufficient documentation

## 2014-07-13 DIAGNOSIS — G43809 Other migraine, not intractable, without status migrainosus: Secondary | ICD-10-CM

## 2014-07-13 MED ORDER — METOPROLOL SUCCINATE ER 100 MG PO TB24: 100.0000 mg | ORAL_TABLET | Freq: Every day | ORAL | Status: DC

## 2014-07-13 MED ORDER — SUMATRIPTAN SUCCINATE 100 MG PO TABS: ORAL_TABLET | ORAL | Status: DC

## 2014-07-13 MED ORDER — HYDROCHLOROTHIAZIDE 25 MG PO TABS
25.0000 mg | ORAL_TABLET | Freq: Every day | ORAL | Status: DC
Start: 2014-07-13 — End: 2014-09-09

## 2014-07-13 NOTE — Assessment & Plan Note (Signed)
Blood pressure remains greater than goal of 140/90. Continue current dosage of metoprolol as her heart rate is 58. Start hydrochlorothiazide daily. Follow-up in one month and will check basic metabolic panel.

## 2014-07-13 NOTE — Progress Notes (Signed)
Subjective:    Patient ID: Sharon Friedman, female    DOB: 04/22/1971, 44 y.o.   MRN: [id number]  Chief Complaint  Patient presents with  . Establish Care    hypertension    HPI:  Sharon Friedman is a 44 y.o. female who presents today to establish care and discuss her blood pressure and migraines.  1) Blood pressure - Previously diagnosed with hypertension. Was recently at her GYN for an annual exam with a blood pressure of 217/100. Currently being treated with metoprolol which has not helped very much. Indicates that she was recently seen at an Urgent Care where they placed her on a couple of medications gave her massive headaches.    2) Migraines - This is a chronic problem. Associated symptoms of headaches has been going on for years. Headaches are described as blinding, throbbing, sensitivity to light and sound with associated nausea. Denies vomiting. Headaches are generally rated 8/10. Modifying factors include the metoprolol and Excedrin migraine.   Allergies  Allergen Reactions  . Food Swelling    Walnut and hazelnut cause tongue "to break out in cuts" and tongue swells    No current outpatient prescriptions on file prior to visit.   No current facility-administered medications on file prior to visit.    Past Medical History  Diagnosis Date  . Hypertension   . Migraines     Past Surgical History  Procedure Laterality Date  . Appendectomy    . Breast enhancement surgery      Family History  Problem Relation Age of Onset  . Hypertension Father   . Healthy Mother   . Healthy Maternal Grandmother   . Colon cancer Maternal Grandfather   . Healthy Paternal Grandfather     History   Social History  . Marital Status: Married    Spouse Name: N/A  . Number of Children: 3  . Years of Education: 18   Occupational History  . Parent educator    Social History Main Topics  . Smoking status: Never Smoker   . Smokeless tobacco: Never Used  . Alcohol Use:  0.6 oz/week    0 Standard drinks or equivalent, 1 Glasses of wine per week  . Drug Use: No  . Sexual Activity: Yes    Birth Control/ Protection: Condom   Other Topics Concern  . Not on file   Social History Narrative   Fun: Cycle   Denies religious beliefs that would effect healthcare.     Review of Systems  Eyes:       Positive for changes in vision - astigmatism.   Respiratory: Negative for chest tightness and shortness of breath.   Cardiovascular: Negative for chest pain, palpitations and leg swelling.      Objective:    BP 220/100 mmHg  Pulse 58  Temp(Src) 98.3 F (36.8 C) (Oral)  Resp 18  Ht 5\' 1"  (1.549 m)  Wt 133 lb (60.328 kg)  BMI 25.14 kg/m2  SpO2 99% Nursing note and vital signs reviewed.  Physical Exam  Constitutional: She is oriented to person, place, and time. She appears well-developed and well-nourished. No distress.  Cardiovascular: Normal rate, regular rhythm, normal heart sounds and intact distal pulses.   Pulmonary/Chest: Effort normal and breath sounds normal.  Neurological: She is alert and oriented to person, place, and time. No cranial nerve deficit. She exhibits normal muscle tone. Coordination normal.  Skin: Skin is warm and dry.  Psychiatric: She has a normal mood and affect. Her  behavior is normal. Judgment and thought content normal.       Assessment & Plan:

## 2014-07-13 NOTE — Progress Notes (Signed)
Pre visit review using our clinic review tool, if applicable. No additional management support is needed unless otherwise documented below in the visit note. 

## 2014-07-13 NOTE — Assessment & Plan Note (Signed)
Stable with no current exacerbations. Currently treated with Excedrin Migraine. Would like to stay away from Excedrin Migraine secondary to caffeine and increasing blood pressure. Start Imitrex. Follow up after several headaches to determine effectiveness.

## 2014-07-13 NOTE — Patient Instructions (Addendum)
Thank you for choosing [facility].  Summary/Instructions:  Your prescription(s) have been submitted to your pharmacy or been printed and provided for you. Please take as directed and contact our office if you believe you are having problem(s) with the medication(s) or have any questions.  Please stop by the lab on the basement level of the building for your blood work. Your results will be released to MyChart (or called to you) after review, usually within 72 hours after test completion. If any changes need to be made, you will be notified at that same time.  If your symptoms worsen or fail to improve, please contact our office for further instruction, or in case of emergency go directly to the emergency room at the closest medical facility.   Please take your blood pressure.   Hypertension Hypertension, commonly called high blood pressure, is when the force of blood pumping through your arteries is too strong. Your arteries are the blood vessels that carry blood from your heart throughout your body. A blood pressure reading consists of a higher number over a lower number, such as 110/72. The higher number (systolic) is the pressure inside your arteries when your heart pumps. The lower number (diastolic) is the pressure inside your arteries when your heart relaxes. Ideally you want your blood pressure below 120/80. Hypertension forces your heart to work harder to pump blood. Your arteries may become narrow or stiff. Having hypertension puts you at risk for heart disease, stroke, and other problems.  RISK FACTORS Some risk factors for high blood pressure are controllable. Others are not.  Risk factors you cannot control include:   Race. You may be at higher risk if you are African American.  Age. Risk increases with age.  Gender. Men are at higher risk than women before age 45 years. After age 45, women are at higher risk than men. Risk factors you can control include:  Not getting  enough exercise or physical activity.  Being overweight.  Getting too much fat, sugar, calories, or salt in your diet.  Drinking too much alcohol. SIGNS AND SYMPTOMS Hypertension does not usually cause signs or symptoms. Extremely high blood pressure (hypertensive crisis) may cause headache, anxiety, shortness of breath, and nosebleed. DIAGNOSIS  To check if you have hypertension, your health care provider will measure your blood pressure while you are seated, with your arm held at the level of your heart. It should be measured at least twice using the same arm. Certain conditions can cause a difference in blood pressure between your right and left arms. A blood pressure reading that is higher than normal on one occasion does not mean that you need treatment. If one blood pressure reading is high, ask your health care provider about having it checked again. TREATMENT  Treating high blood pressure includes making lifestyle changes and possibly taking medicine. Living a healthy lifestyle can help lower high blood pressure. You may need to change some of your habits. Lifestyle changes may include:  Following the DASH diet. This diet is high in fruits, vegetables, and whole grains. It is low in salt, red meat, and added sugars.  Getting at least 2 hours of brisk physical activity every week.  Losing weight if necessary.  Not smoking.  Limiting alcoholic beverages.  Learning ways to reduce stress. If lifestyle changes are not enough to get your blood pressure under control, your health care provider may prescribe medicine. You may need to take more than one. Work closely with your health  care provider to understand the risks and benefits. HOME CARE INSTRUCTIONS  Have your blood pressure rechecked as directed by your health care provider.   Take medicines only as directed by your health care provider. Follow the directions carefully. Blood pressure medicines must be taken as prescribed.  The medicine does not work as well when you skip doses. Skipping doses also puts you at risk for problems.   Do not smoke.   Monitor your blood pressure at home as directed by your health care provider. SEEK MEDICAL CARE IF:   You think you are having a reaction to medicines taken.  You have recurrent headaches or feel dizzy.  You have swelling in your ankles.  You have trouble with your vision. SEEK IMMEDIATE MEDICAL CARE IF:  You develop a severe headache or confusion.  You have unusual weakness, numbness, or feel faint.  You have severe chest or abdominal pain.  You vomit repeatedly.  You have trouble breathing. MAKE SURE YOU:   Understand these instructions.  Will watch your condition.  Will get help right away if you are not doing well or get worse. Document Released: 04/17/2005 Document Revised: 09/01/2013 Document Reviewed: 02/07/2013 [facility] Patient Information 2015 [place], [place]. This information is not intended to replace advice given to you by your health care provider. Make sure you discuss any questions you have with your health care provider.

## 2014-07-14 ENCOUNTER — Telehealth: Payer: Self-pay | Admitting: Family

## 2014-07-14 NOTE — Telephone Encounter (Signed)
emmi emailed °

## 2014-08-14 ENCOUNTER — Other Ambulatory Visit (INDEPENDENT_AMBULATORY_CARE_PROVIDER_SITE_OTHER): Payer: Self-pay

## 2014-08-14 ENCOUNTER — Ambulatory Visit (INDEPENDENT_AMBULATORY_CARE_PROVIDER_SITE_OTHER): Payer: Self-pay | Admitting: Family

## 2014-08-14 ENCOUNTER — Telehealth: Payer: Self-pay | Admitting: Family

## 2014-08-14 ENCOUNTER — Encounter: Payer: Self-pay | Admitting: Family

## 2014-08-14 VITALS — BP 150/94 | HR 62 | Temp 98.2°F | Resp 18 | Ht 61.0 in | Wt 133.4 lb

## 2014-08-14 DIAGNOSIS — I10 Essential (primary) hypertension: Secondary | ICD-10-CM

## 2014-08-14 LAB — BASIC METABOLIC PANEL
BUN: 13 mg/dL (ref 6–23)
CO2: 31 meq/L (ref 19–32)
CREATININE: 0.79 mg/dL (ref 0.40–1.20)
Calcium: 9.5 mg/dL (ref 8.4–10.5)
Chloride: 102 mEq/L (ref 96–112)
GFR: 83.83 mL/min (ref >60.00)
GLUCOSE: 109 mg/dL — AB (ref 70–99)
Potassium: 4.1 mEq/L (ref 3.5–5.1)
Sodium: 137 mEq/L (ref 135–145)

## 2014-08-14 MED ORDER — AMLODIPINE BESYLATE 10 MG PO TABS: 10.0000 mg | ORAL_TABLET | Freq: Every day | ORAL | Status: DC

## 2014-08-14 NOTE — Progress Notes (Signed)
Pre visit review using our clinic review tool, if applicable. No additional management support is needed unless otherwise documented below in the visit note. 

## 2014-08-14 NOTE — Telephone Encounter (Signed)
Pt aware of results 

## 2014-08-14 NOTE — Patient Instructions (Signed)
Thank you for choosing [facility].  Summary/Instructions:  Please STOP taking the METOPROLOL and START taking the Amlodipine.  Please continue to monitor your blood pressures at home.   Your prescription(s) have been submitted to your pharmacy or been printed and provided for you. Please take as directed and contact our office if you believe you are having problem(s) with the medication(s) or have any questions.  Please stop by the lab on the basement level of the building for your blood work. Your results will be released to MyChart (or called to you) after review, usually within 72 hours after test completion. If any changes need to be made, you will be notified at that same time.

## 2014-08-14 NOTE — Telephone Encounter (Signed)
Please inform the patient that her kidney function and electrolytes are both within the normal ranges. Therefore please keep with treatment as discussed.

## 2014-08-14 NOTE — Assessment & Plan Note (Signed)
Blood pressure is vastly improved since previous visit. Remains above goal 140/90. Indicates she is having some fatigue with the current regimen. Discontinue metoprolol. Start amlodipine. Continue hydrochlorothiazide at current dosage. Obtain basic metabolic panel to check kidney function and electrolytes. Follow-up in one month.

## 2014-08-14 NOTE — Progress Notes (Signed)
   Subjective:    Patient ID: Sharon Friedman, female    DOB: 11/19/1970, 44 y.o.   MRN: [id number]  Chief Complaint  Patient presents with  . Follow-up    pt states that since being on HCTZ she has been very tired    HPI:  Sharon Friedman is a 44 y.o. female who presents today to follow up of her hypertension.  Previously noted to have uncontrolled blood pressure and was started on HCTZ when metoprolol was not enough to control her pressure. Indicates that since she started taking the medicaiton, she has been experiencing the associated symptom of feeling very tired. Indicates that she takes her blood pressure at home it is the lowest it has ever been and she has also noted a decrease in headaches.    BP Readings from Last 3 Encounters:  08/14/14 150/94  07/13/14 220/100  05/09/11 162/92    Allergies  Allergen Reactions  . Food Swelling    Walnut and hazelnut cause tongue "to break out in cuts" and tongue swells    Current Outpatient Prescriptions on File Prior to Visit  Medication Sig Dispense Refill  . hydrochlorothiazide (HYDRODIURIL) 25 MG tablet Take 1 tablet (25 mg total) by mouth daily. 30 tablet 1   No current facility-administered medications on file prior to visit.    Review of Systems  Respiratory: Negative for chest tightness.   Cardiovascular: Negative for chest pain, palpitations and leg swelling.      Objective:    BP 150/94 mmHg  Pulse 62  Resp 18  Ht 5\' 1"  (1.549 m)  Wt 133 lb 6.4 oz (60.51 kg)  BMI 25.22 kg/m2  SpO2 99% Nursing note and vital signs reviewed.  Physical Exam  Constitutional: She is oriented to person, place, and time. She appears well-developed and well-nourished. No distress.  Cardiovascular: Normal rate, regular rhythm, normal heart sounds and intact distal pulses.   Pulmonary/Chest: Effort normal and breath sounds normal.  Neurological: She is alert and oriented to person, place, and time.  Skin: Skin is warm and dry.    Psychiatric: She has a normal mood and affect. Her behavior is normal. Judgment and thought content normal.       Assessment & Plan:

## 2014-08-17 ENCOUNTER — Encounter: Payer: Self-pay | Admitting: Family

## 2014-09-09 ENCOUNTER — Ambulatory Visit (INDEPENDENT_AMBULATORY_CARE_PROVIDER_SITE_OTHER): Payer: Self-pay | Admitting: Family

## 2014-09-09 ENCOUNTER — Encounter: Payer: Self-pay | Admitting: Family

## 2014-09-09 VITALS — BP 162/100 | HR 64 | Temp 98.0°F | Resp 18 | Ht 61.0 in | Wt 131.0 lb

## 2014-09-09 DIAGNOSIS — I10 Essential (primary) hypertension: Secondary | ICD-10-CM

## 2014-09-09 MED ORDER — METOPROLOL SUCCINATE ER 100 MG PO TB24: 100.0000 mg | ORAL_TABLET | Freq: Every day | ORAL | Status: DC

## 2014-09-09 MED ORDER — CHLORTHALIDONE 25 MG PO TABS: 25.0000 mg | ORAL_TABLET | Freq: Every day | ORAL | Status: DC

## 2014-09-09 NOTE — Patient Instructions (Signed)
Thank you for choosing [facility].  Summary/Instructions:  Your prescription(s) have been submitted to your pharmacy or been printed and provided for you. Please take as directed and contact our office if you believe you are having problem(s) with the medication(s) or have any questions.  If your symptoms worsen or fail to improve, please contact our office for further instruction, or in case of emergency go directly to the emergency room at the closest medical facility.     

## 2014-09-09 NOTE — Progress Notes (Signed)
Pre visit review using our clinic review tool, if applicable. No additional management support is needed unless otherwise documented below in the visit note. 

## 2014-09-09 NOTE — Progress Notes (Signed)
   Subjective:    Patient ID: Sharon OuZoraida G Druckenmiller, female    DOB: 12/09/1970, 44 y.o.   MRN: [id number]  Chief Complaint  Patient presents with  . Follow-up    stopped taking the amlodipine after 4 days due to it making her really dizzy, went back on the metoprolol with the HCTZ    HPI:  Sharon Friedman is a 44 y.o. female who presents today for an office follow up.   Recently started on amlodipine for her blood pressure and in combination with hyrdochlorothizide. Notes that the amlodipine made her feel dizzy after about 4 days. She discontinued the amlodipine and restarted the metoprolol 100 mg.   BP Readings from Last 3 Encounters:  09/09/14 162/100  08/14/14 150/94  07/13/14 220/100    Allergies  Allergen Reactions  . Amlodipine     Dizziness    . Food Swelling    Walnut and hazelnut cause tongue "to break out in cuts" and tongue swells  . Lisinopril Itching and Swelling    Face gets red, swells up, and itches    No current outpatient prescriptions on file prior to visit.   No current facility-administered medications on file prior to visit.    Review of Systems  Respiratory: Negative for chest tightness.   Cardiovascular: Negative for chest pain, palpitations and leg swelling.      Objective:    BP 162/100 mmHg  Pulse 64  Temp(Src) 98 F (36.7 C) (Oral)  Resp 18  Ht 5\' 1"  (1.549 m)  Wt 131 lb (59.421 kg)  BMI 24.76 kg/m2  SpO2 99% Nursing note and vital signs reviewed.  Physical Exam  Constitutional: She is oriented to person, place, and time. She appears well-developed and well-nourished. No distress.  Cardiovascular: Normal rate, regular rhythm, normal heart sounds and intact distal pulses.   Pulmonary/Chest: Effort normal and breath sounds normal.  Neurological: She is alert and oriented to person, place, and time.  Skin: Skin is warm and dry.  Psychiatric: She has a normal mood and affect. Her behavior is normal. Judgment and thought content  normal.       Assessment & Plan:

## 2014-09-09 NOTE — Assessment & Plan Note (Signed)
Blood pressure remains above goal of 140/90 with current regimen. Discussed options with patient. Discontinue amlodipine and placed on the allergy list. Continue metoprolol at current dosage. Discontinue hydrochlorothiazide. Start chlorthalidone. Follow up in 1 month to determine pressure control and electrolytes.

## 2014-10-07 ENCOUNTER — Telehealth: Payer: Self-pay | Admitting: Family

## 2014-10-07 MED ORDER — CHLORTHALIDONE 25 MG PO TABS: 25.0000 mg | ORAL_TABLET | Freq: Every day | ORAL | Status: DC

## 2014-10-07 NOTE — Telephone Encounter (Signed)
Medication refilled

## 2014-10-07 NOTE — Telephone Encounter (Signed)
Informed pt .

## 2014-10-07 NOTE — Telephone Encounter (Signed)
Patient is requesting refill for chlorthalidone (HYGROTON) 25 MG tablet [[id number]][[id number]] . It is working well for her. Pharmacy is [facility] on [address]

## 2014-12-18 ENCOUNTER — Telehealth: Payer: Self-pay | Admitting: Family

## 2014-12-18 MED ORDER — METOPROLOL SUCCINATE ER 100 MG PO TB24: 100.0000 mg | ORAL_TABLET | Freq: Every day | ORAL | Status: DC

## 2014-12-18 MED ORDER — CHLORTHALIDONE 25 MG PO TABS: 25.0000 mg | ORAL_TABLET | Freq: Every day | ORAL | Status: DC

## 2014-12-18 NOTE — Telephone Encounter (Signed)
Medication sent. Pt aware 

## 2014-12-18 NOTE — Telephone Encounter (Signed)
Pt request refill for chlorthalidone (HYGROTON) 25 MG tablet and metoprolol succinate (TOPROL XL) 100 MG 24 hr tablet to be send to [facility] on [address]. Please help pt is out of these med and was wondering if we can do this ASAP. Please call pt once we send in the refill.

## 2015-04-05 ENCOUNTER — Telehealth: Payer: Self-pay | Admitting: Family

## 2015-04-05 MED ORDER — METOPROLOL SUCCINATE ER 100 MG PO TB24: 100.0000 mg | ORAL_TABLET | Freq: Every day | ORAL | Status: DC

## 2015-04-05 MED ORDER — CHLORTHALIDONE 25 MG PO TABS: 25.0000 mg | ORAL_TABLET | Freq: Every day | ORAL | Status: DC

## 2015-04-05 NOTE — Telephone Encounter (Signed)
Rx has been sent. Pt aware 

## 2015-04-05 NOTE — Telephone Encounter (Signed)
Patient requesting refill for chlorthalidone (HYGROTON) 25 MG tablet [[id number]][[id number]]  And metoprolol succinate (TOPROL XL) 100 MG  Pharmacy is [facility] on [address]

## 2015-08-01 ENCOUNTER — Other Ambulatory Visit: Payer: Self-pay | Admitting: Family

## 2015-08-31 ENCOUNTER — Other Ambulatory Visit: Payer: Self-pay | Admitting: Family

## 2015-08-31 MED ORDER — METOPROLOL SUCCINATE ER 100 MG PO TB24: ORAL_TABLET | ORAL | Status: DC

## 2015-08-31 NOTE — Telephone Encounter (Signed)
Pt called triage stating she is needing refills today will be leaving going out of town tomorrow. Inform her will only send 30 day she is due for yearly physical.../lmb

## 2015-08-31 NOTE — Telephone Encounter (Signed)
Patient is also requesting metoprolol.  Please send to pharmacy as soon as possible

## 2015-10-04 ENCOUNTER — Telehealth: Payer: Self-pay | Admitting: *Deleted

## 2015-10-04 NOTE — Telephone Encounter (Signed)
Left msg on triage stqting needing refills on her medications. Pt is overduevfor appt was told @ last refill need OV. Called pt back no answer LMOM need OV for refills...Raechel Chute/lmb

## 2015-10-05 ENCOUNTER — Other Ambulatory Visit: Payer: Self-pay | Admitting: Family

## 2015-10-07 ENCOUNTER — Telehealth: Payer: Self-pay | Admitting: Family

## 2015-10-07 ENCOUNTER — Other Ambulatory Visit (INDEPENDENT_AMBULATORY_CARE_PROVIDER_SITE_OTHER): Payer: Self-pay

## 2015-10-07 ENCOUNTER — Encounter: Payer: Self-pay | Admitting: Family

## 2015-10-07 ENCOUNTER — Ambulatory Visit (INDEPENDENT_AMBULATORY_CARE_PROVIDER_SITE_OTHER): Payer: Self-pay | Admitting: Family

## 2015-10-07 VITALS — BP 136/92 | HR 57 | Temp 97.9°F | Resp 16 | Ht 61.0 in | Wt 138.0 lb

## 2015-10-07 DIAGNOSIS — Z5181 Encounter for therapeutic drug level monitoring: Secondary | ICD-10-CM

## 2015-10-07 DIAGNOSIS — I10 Essential (primary) hypertension: Secondary | ICD-10-CM

## 2015-10-07 LAB — COMPREHENSIVE METABOLIC PANEL
ALT: 14 U/L (ref 0–35)
AST: 16 U/L (ref 0–37)
Albumin: 4.4 g/dL (ref 3.5–5.2)
Alkaline Phosphatase: 57 U/L (ref 39–117)
BUN: 15 mg/dL (ref 6–23)
CHLORIDE: 101 meq/L (ref 96–112)
CO2: 31 meq/L (ref 19–32)
CREATININE: 0.76 mg/dL (ref 0.40–1.20)
Calcium: 9.7 mg/dL (ref 8.4–10.5)
GFR: 87.21 mL/min (ref >60.00)
Glucose, Bld: 88 mg/dL (ref 70–99)
POTASSIUM: 3.8 meq/L (ref 3.5–5.1)
SODIUM: 137 meq/L (ref 135–145)
Total Bilirubin: 0.6 mg/dL (ref 0.2–1.2)
Total Protein: 7.5 g/dL (ref 6.0–8.3)

## 2015-10-07 LAB — IBC PANEL
IRON: 87 ug/dL (ref 42–145)
SATURATION RATIOS: 22.2 % (ref 20.0–50.0)
TRANSFERRIN: 280 mg/dL (ref 212.0–360.0)

## 2015-10-07 LAB — CBC
HCT: 40 % (ref 36.0–46.0)
Hemoglobin: 13.7 g/dL (ref 12.0–15.0)
MCHC: 34.2 g/dL (ref 30.0–36.0)
MCV: 94.8 fl (ref 78.0–100.0)
Platelets: 228 10*3/uL (ref 150.0–400.0)
RBC: 4.22 Mil/uL (ref 3.87–5.11)
RDW: 13.2 % (ref 11.5–15.5)
WBC: 7.5 10*3/uL (ref 4.0–10.5)

## 2015-10-07 MED ORDER — CHLORTHALIDONE 25 MG PO TABS: 25.0000 mg | ORAL_TABLET | Freq: Every day | ORAL | Status: DC

## 2015-10-07 MED ORDER — METOPROLOL SUCCINATE ER 100 MG PO TB24: ORAL_TABLET | ORAL | Status: DC

## 2015-10-07 NOTE — Progress Notes (Signed)
Pre visit review using our clinic review tool, if applicable. No additional management support is needed unless otherwise documented below in the visit note. 

## 2015-10-07 NOTE — Patient Instructions (Addendum)
Thank you for choosing [facility].  Summary/Instructions:  Please use My[employer]itnessPal to track calories and exercise.  Please continue to take your medication as prescribed.   Continue to monitor your blood pressure at home.   Your prescription(s) have been submitted to your pharmacy or been printed and provided for you. Please take as directed and contact our office if you believe you are having problem(s) with the medication(s) or have any questions.  Please stop by the lab on the basement level of the building for your blood work. Your results will be released to MyChart (or called to you) after review, usually within 72 hours after test completion. If any changes need to be made, you will be notified at that same time.

## 2015-10-07 NOTE — Progress Notes (Signed)
Subjective:    Patient ID: Sharon Friedman, female    DOB: 07/26/1970, 45 y.o.   MRN: [id number]  Chief Complaint  Patient presents with  . Medication Refill    needs refill of both medication perferably 3 month supply    HPI:  Sharon Friedman is a 45 y.o. female who  has a past medical history of Hypertension and Migraines. and presents today for a follow up office visit.   1.) Hypertension - Currently maintained on chlorthalidone and metoprolol. Reports taken medications prescribed and denies adverse side effects. Has been out of medication for the past 2 days secondary to no refills available.Blood pressure at home have been averaging below 140/90.  Denies any symptoms of end organ damage. Denies worse headache of her life.  BP Readings from Last 3 Encounters:  10/07/15 136/92  09/09/14 162/100  08/14/14 150/94    Allergies  Allergen Reactions  . Amlodipine     Dizziness    . Food Swelling    Walnut and hazelnut cause tongue "to break out in cuts" and tongue swells  . Lisinopril Itching and Swelling    Face gets red, swells up, and itches     Review of Systems  Constitutional: Negative for fever and chills.  Eyes:       Negative for changes in vision  Respiratory: Negative for cough, chest tightness and wheezing.   Cardiovascular: Negative for chest pain, palpitations and leg swelling.  Neurological: Negative for dizziness, weakness and light-headedness.      Objective:    BP 136/92 mmHg  Pulse 57  Temp(Src) 97.9 F (36.6 C) (Oral)  Resp 16  Ht 5\' 1"  (1.549 m)  Wt 138 lb (62.596 kg)  BMI 26.09 kg/m2  SpO2 95% Nursing note and vital signs reviewed.  Physical Exam  Constitutional: She is oriented to person, place, and time. She appears well-developed and well-nourished. No distress.  Cardiovascular: Normal rate, regular rhythm, normal heart sounds and intact distal pulses.   Pulmonary/Chest: Effort normal and breath sounds normal.  Neurological: She  is alert and oriented to person, place, and time.  Skin: Skin is warm and dry.  Psychiatric: She has a normal mood and affect. Her behavior is normal. Judgment and thought content normal.       Assessment & Plan:   Problem List Items Addressed This Visit      Cardiovascular and Mediastinum   Essential hypertension - Primary    Blood pressure remains slightly elevated today with current regimen although has not had medication in the past 2 days. Denies adverse side effects or symptoms of end organ damage and notes her home blood pressures are generally well controlled. Obtain complete metabolic panel, IBC panel, and CBC for therapeutic drug monitoring. Continue current dosage of chlorthalidone and metoprolol. Encouraged to continue monitoring blood pressure at home. Follow-up in 6 months or sooner if needed.      Relevant Medications   chlorthalidone (HYGROTON) 25 MG tablet   metoprolol succinate (TOPROL-XL) 100 MG 24 hr tablet   Other Relevant Orders   CBC (Completed)   IBC panel (Completed)   Comprehensive metabolic panel (Completed)    Other Visit Diagnoses    Encounter for therapeutic drug monitoring        Relevant Orders    CBC (Completed)    IBC panel (Completed)    Comprehensive metabolic panel (Completed)        I have discontinued Ms. Aller's chlorthalidone. I have also changed her  chlorthalidone. Additionally, I am having her maintain her metoprolol succinate.   Meds ordered this encounter  Medications  . chlorthalidone (HYGROTON) 25 MG tablet    Sig: Take 1 tablet (25 mg total) by mouth daily.    Dispense:  90 tablet    Refill:  0    Order Specific Question:  Supervising Provider    Answer:  Hillard Danker A [[id number]]  . metoprolol succinate (TOPROL-XL) 100 MG 24 hr tablet    Sig: TAKE ONE TABLET BY MOUTH ONCE DAILY WITH  OR  IMMEDIATELY  FOLLOWING  A  MEAL    Dispense:  90 tablet    Refill:  0    Order Specific Question:  Supervising Provider     Answer:  Hillard Danker A [[id number]]     Follow-up: Return in about 6 months (around 04/07/2016), or if symptoms worsen or fail to improve.  Jeanine Luz, FNP

## 2015-10-07 NOTE — Assessment & Plan Note (Signed)
Blood pressure remains slightly elevated today with current regimen although has not had medication in the past 2 days. Denies adverse side effects or symptoms of end organ damage and notes her home blood pressures are generally well controlled. Obtain complete metabolic panel, IBC panel, and CBC for therapeutic drug monitoring. Continue current dosage of chlorthalidone and metoprolol. Encouraged to continue monitoring blood pressure at home. Follow-up in 6 months or sooner if needed.

## 2015-10-07 NOTE — Telephone Encounter (Signed)
Please inform patient that her blood work shows that her iron levels, white/red blood cells, kidney function, liver function, and electrolytes are all normal. Therefore no changes are needed at this time.

## 2015-10-08 NOTE — Telephone Encounter (Signed)
Tried calling pt. No answer and no VM. Will try back later. 

## 2015-10-11 NOTE — Telephone Encounter (Signed)
Pt aware of results 

## 2015-11-07 ENCOUNTER — Other Ambulatory Visit: Payer: Self-pay | Admitting: Family

## 2015-12-17 ENCOUNTER — Other Ambulatory Visit: Payer: Self-pay

## 2015-12-17 DIAGNOSIS — I10 Essential (primary) hypertension: Secondary | ICD-10-CM

## 2015-12-17 MED ORDER — CHLORTHALIDONE 25 MG PO TABS: 25.0000 mg | ORAL_TABLET | Freq: Every day | ORAL | 0 refills | Status: DC

## 2016-01-17 ENCOUNTER — Other Ambulatory Visit: Payer: Self-pay | Admitting: Family

## 2016-01-17 DIAGNOSIS — I10 Essential (primary) hypertension: Secondary | ICD-10-CM

## 2016-02-21 ENCOUNTER — Other Ambulatory Visit: Payer: Self-pay | Admitting: Family

## 2016-07-23 ENCOUNTER — Other Ambulatory Visit: Payer: Self-pay | Admitting: Family

## 2016-09-01 ENCOUNTER — Other Ambulatory Visit: Payer: Self-pay | Admitting: Family

## 2016-09-01 DIAGNOSIS — I10 Essential (primary) hypertension: Secondary | ICD-10-CM

## 2016-10-02 ENCOUNTER — Other Ambulatory Visit: Payer: Self-pay | Admitting: Family

## 2016-10-02 DIAGNOSIS — I10 Essential (primary) hypertension: Secondary | ICD-10-CM

## 2016-11-06 ENCOUNTER — Other Ambulatory Visit: Payer: Self-pay | Admitting: Family

## 2016-11-06 DIAGNOSIS — I10 Essential (primary) hypertension: Secondary | ICD-10-CM

## 2016-11-07 ENCOUNTER — Telehealth: Payer: Self-pay | Admitting: Family

## 2016-11-07 MED ORDER — METOPROLOL SUCCINATE ER 100 MG PO TB24: ORAL_TABLET | ORAL | 0 refills | Status: DC

## 2016-11-07 NOTE — Telephone Encounter (Signed)
Pt needs a refill of metoprolol succinate (TOPROL-XL) 100 MG 24 hr tablet [facility] on [address]  OV set up for 7/17

## 2016-11-07 NOTE — Telephone Encounter (Signed)
Rx sent 

## 2016-11-14 ENCOUNTER — Encounter: Payer: Self-pay | Admitting: Family

## 2016-11-14 ENCOUNTER — Ambulatory Visit (INDEPENDENT_AMBULATORY_CARE_PROVIDER_SITE_OTHER): Payer: Managed Care, Other (non HMO) | Admitting: Family

## 2016-11-14 VITALS — BP 152/96 | HR 65 | Temp 98.6°F | Resp 16 | Ht 61.0 in | Wt 128.8 lb

## 2016-11-14 DIAGNOSIS — I10 Essential (primary) hypertension: Secondary | ICD-10-CM

## 2016-11-14 DIAGNOSIS — H52223 Regular astigmatism, bilateral: Secondary | ICD-10-CM | POA: Diagnosis not present

## 2016-11-14 DIAGNOSIS — L80 Vitiligo: Secondary | ICD-10-CM | POA: Diagnosis not present

## 2016-11-14 MED ORDER — CHLORTHALIDONE 25 MG PO TABS: 25.0000 mg | ORAL_TABLET | Freq: Every day | ORAL | 5 refills | Status: DC

## 2016-11-14 MED ORDER — METOPROLOL SUCCINATE ER 100 MG PO TB24: ORAL_TABLET | ORAL | 5 refills | Status: DC

## 2016-11-14 NOTE — Assessment & Plan Note (Signed)
Symptoms and exam are consistent with vitiligo with no current treatment. Refer to dermatology for further assessment and treatment per patient request.

## 2016-11-14 NOTE — Assessment & Plan Note (Signed)
Blood pressure poorly controlled with less than optimal patient compliance with medication regimen. Encouraged importance of taking medication as prescribed to prevent end organ damage in the future. Continue current dosage of metoprolol and refill chlorthalidone. Monitor blood pressure at home and follow low-sodium diet. Denies worst headache of life with no new symptoms of end organ damage noted on physical exam. Follow-up in 2 weeks for nurse visit to recheck blood pressure.

## 2016-11-14 NOTE — Patient Instructions (Addendum)
Thank you for choosing [facility].  SUMMARY AND INSTRUCTIONS:  Please continue to take your medications as prescribed.  Monitor your blood pressure at home and follow a low sodium diet.   You will receive a call to schedule your dermatology and ophthalmology appointments.  Medication:  Your prescription(s) have been submitted to your pharmacy or been printed and provided for you. Please take as directed and contact our office if you believe you are having problem(s) with the medication(s) or have any questions.  Follow up:  If your symptoms worsen or fail to improve, please contact our office for further instruction, or in case of emergency go directly to the emergency room at the closest medical facility.

## 2016-11-14 NOTE — Progress Notes (Signed)
Subjective:    Patient ID: Sharon Friedman, female    DOB: 11/24/1970, 46 y.o.   MRN: [id number]  Chief Complaint  Patient presents with  . Medication Refill    hypertension, needs bp meds, referral to dermatology    HPI:  Sharon Friedman is a 46 y.o. female who  has a past medical history of Hypertension and Migraines. and presents today for a follow up office visit.  1.) Hypertension - Currently prescribed metoprolol and chlorthalidone and reports less than optimal compliance with taking the medications and denies adverse side effects or hypotensive readings. Blood pressures at home have been slightly elevated above goal of 140/90. Denies changes in vision, worst headache of life or new symptoms of end organ damage. Working on following a low sodium diet.   BP Readings from Last 3 Encounters:  11/14/16 (!) 152/96  10/07/15 (!) 136/92  09/09/14 (!) 162/100    2.) Vitiligo - Previously diagnosed with vitiligo. Has seen dermatology in the past and not currently maintained on any medications and is requesting referral to a new dermatologist.     Allergies  Allergen Reactions  . Amlodipine     Dizziness    . Food Swelling    Walnut and hazelnut cause tongue "to break out in cuts" and tongue swells  . Lisinopril Itching and Swelling    Face gets red, swells up, and itches      Outpatient Medications Prior to Visit  Medication Sig Dispense Refill  . chlorthalidone (HYGROTON) 25 MG tablet Take 1 tablet (25 mg total) by mouth daily. 90 tablet 0  . chlorthalidone (HYGROTON) 25 MG tablet TAKE ONE TABLET BY MOUTH ONCE DAILY 30 tablet 5  . metoprolol succinate (TOPROL-XL) 100 MG 24 hr tablet TAKE ONE TABLET BY MOUTH ONCE DAILY WITH OR IMMEDIATELY FOLLOWING A MEAL 90 tablet 0  . metoprolol succinate (TOPROL-XL) 100 MG 24 hr tablet Take 1 tablet (100 mg total) by mouth daily. Annual appt is due must see Tammy SoursGreg for refills 30 tablet 0  . metoprolol succinate (TOPROL-XL) 100 MG 24  hr tablet TAKE ONE TABLET BY MOUTH ONCE DAILY WITH OR IMMEDIATELY FOLLOWING A MEAL 30 tablet 0   No facility-administered medications prior to visit.     Review of Systems  Constitutional: Negative for chills and fever.  Eyes:       Negative for changes in vision  Respiratory: Negative for cough, chest tightness and wheezing.   Cardiovascular: Negative for chest pain, palpitations and leg swelling.  Skin: Positive for color change and rash.  Neurological: Negative for dizziness, weakness and light-headedness.      Objective:    BP (!) 152/96 (BP Location: Left Arm, Patient Position: Sitting, Cuff Size: Normal)   Pulse 65   Temp 98.6 F (37 C) (Oral)   Resp 16   Ht 5\' 1"  (1.549 m)   Wt 128 lb 12.8 oz (58.4 kg)   SpO2 98%   BMI 24.34 kg/m  Nursing note and vital signs reviewed.  Physical Exam  Constitutional: She is oriented to person, place, and time. She appears well-developed and well-nourished. No distress.  Eyes: Pupils are equal, round, and reactive to light. Conjunctivae and EOM are normal.  Cardiovascular: Normal rate, regular rhythm, normal heart sounds and intact distal pulses.   Pulmonary/Chest: Effort normal and breath sounds normal.  Neurological: She is alert and oriented to person, place, and time. No cranial nerve deficit.  Skin: Skin is warm and dry.  Bilateral  hands with patches of lighter skin in various shapes and patterns.  Psychiatric: She has a normal mood and affect. Her behavior is normal. Judgment and thought content normal.       Assessment & Plan:   Problem List Items Addressed This Visit      Cardiovascular and Mediastinum   Essential hypertension - Primary    Blood pressure poorly controlled with less than optimal patient compliance with medication regimen. Encouraged importance of taking medication as prescribed to prevent end organ damage in the future. Continue current dosage of metoprolol and refill chlorthalidone. Monitor blood pressure  at home and follow low-sodium diet. Denies worst headache of life with no new symptoms of end organ damage noted on physical exam. Follow-up in 2 weeks for nurse visit to recheck blood pressure.      Relevant Medications   metoprolol succinate (TOPROL-XL) 100 MG 24 hr tablet   chlorthalidone (HYGROTON) 25 MG tablet     Musculoskeletal and Integument   Vitiligo    Symptoms and exam are consistent with vitiligo with no current treatment. Refer to dermatology for further assessment and treatment per patient request.      Relevant Orders   Ambulatory referral to Dermatology    Other Visit Diagnoses    Regular astigmatism of both eyes       Relevant Orders   Ambulatory referral to Ophthalmology       I have discontinued Ms. Steuber's chlorthalidone. I have also changed her chlorthalidone. Additionally, I am having her maintain her metoprolol succinate.   Meds ordered this encounter  Medications  . metoprolol succinate (TOPROL-XL) 100 MG 24 hr tablet    Sig: TAKE ONE TABLET BY MOUTH ONCE DAILY WITH OR IMMEDIATELY FOLLOWING A MEAL    Dispense:  30 tablet    Refill:  5    Order Specific Question:   Supervising Provider    Answer:   Hillard Danker A [[id number]]  . chlorthalidone (HYGROTON) 25 MG tablet    Sig: Take 1 tablet (25 mg total) by mouth daily.    Dispense:  30 tablet    Refill:  5    Order Specific Question:   Supervising Provider    Answer:   Hillard Danker A [[id number]]     Follow-up: Return in about 2 weeks (around 11/28/2016), or if symptoms worsen or fail to improve, for BP check.  Jeanine Luz, FNP

## 2016-11-28 ENCOUNTER — Ambulatory Visit: Payer: Managed Care, Other (non HMO)

## 2017-07-13 ENCOUNTER — Ambulatory Visit: Payer: Managed Care, Other (non HMO) | Admitting: Family

## 2017-07-13 ENCOUNTER — Other Ambulatory Visit (INDEPENDENT_AMBULATORY_CARE_PROVIDER_SITE_OTHER): Payer: Managed Care, Other (non HMO)

## 2017-07-13 ENCOUNTER — Encounter: Payer: Self-pay | Admitting: Family

## 2017-07-13 ENCOUNTER — Other Ambulatory Visit: Payer: Self-pay | Admitting: Family

## 2017-07-13 DIAGNOSIS — I10 Essential (primary) hypertension: Secondary | ICD-10-CM | POA: Diagnosis not present

## 2017-07-13 LAB — CBC WITH DIFFERENTIAL/PLATELET
BASOS PCT: 0.7 % (ref 0.0–3.0)
Basophils Absolute: 0.1 10*3/uL (ref 0.0–0.1)
EOS ABS: 0.2 10*3/uL (ref 0.0–0.7)
Eosinophils Relative: 1.7 % (ref 0.0–5.0)
HCT: 43 % (ref 36.0–46.0)
Hemoglobin: 14.7 g/dL (ref 12.0–15.0)
Lymphocytes Relative: 30.8 % (ref 12.0–46.0)
Lymphs Abs: 3 10*3/uL (ref 0.7–4.0)
MCHC: 34.3 g/dL (ref 30.0–36.0)
MCV: 95.3 fl (ref 78.0–100.0)
Monocytes Absolute: 0.9 10*3/uL (ref 0.1–1.0)
Monocytes Relative: 9.3 % (ref 3.0–12.0)
NEUTROS PCT: 57.5 % (ref 43.0–77.0)
Neutro Abs: 5.6 10*3/uL (ref 1.4–7.7)
PLATELETS: 259 10*3/uL (ref 150.0–400.0)
RBC: 4.51 Mil/uL (ref 3.87–5.11)
RDW: 13.2 % (ref 11.5–15.5)
WBC: 9.7 10*3/uL (ref 4.0–10.5)

## 2017-07-13 LAB — COMPREHENSIVE METABOLIC PANEL
ALBUMIN: 4.8 g/dL (ref 3.5–5.2)
ALT: 19 U/L (ref 0–35)
AST: 15 U/L (ref 0–37)
Alkaline Phosphatase: 75 U/L (ref 39–117)
BUN: 18 mg/dL (ref 6–23)
CHLORIDE: 99 meq/L (ref 96–112)
CO2: 34 mEq/L — ABNORMAL HIGH (ref 19–32)
CREATININE: 0.87 mg/dL (ref 0.40–1.20)
Calcium: 10.7 mg/dL — ABNORMAL HIGH (ref 8.4–10.5)
GFR: 74.04 mL/min (ref >60.00)
Glucose, Bld: 118 mg/dL — ABNORMAL HIGH (ref 70–99)
Potassium: 3.8 mEq/L (ref 3.5–5.1)
Sodium: 140 mEq/L (ref 135–145)
Total Bilirubin: 0.5 mg/dL (ref 0.2–1.2)
Total Protein: 8.2 g/dL (ref 6.0–8.3)

## 2017-07-13 MED ORDER — CHLORTHALIDONE 25 MG PO TABS: 25.0000 mg | ORAL_TABLET | Freq: Every day | ORAL | 2 refills | Status: DC

## 2017-07-13 MED ORDER — METOPROLOL SUCCINATE ER 100 MG PO TB24: ORAL_TABLET | ORAL | 2 refills | Status: DC

## 2017-07-13 NOTE — Progress Notes (Signed)
Sharon Friedman is a 47 y.o. female with the following history as recorded in EpicCare:  Patient Active Problem List   Diagnosis Date Noted  . Vitiligo 11/14/2016  . Essential hypertension 07/13/2014  . Migraines 07/13/2014    Current Outpatient Medications  Medication Sig Dispense Refill  . chlorthalidone (HYGROTON) 25 MG tablet Take 1 tablet (25 mg total) by mouth daily. 30 tablet 2  . levonorgestrel (MIRENA, 52 MG,) 20 MCG/24HR IUD Mirena 20 mcg/24 hours (5 yrs) 52 mg intrauterine device  Take 1 device by intrauterine route.    . metoprolol succinate (TOPROL-XL) 100 MG 24 hr tablet TAKE ONE TABLET BY MOUTH ONCE DAILY WITH OR IMMEDIATELY FOLLOWING A MEAL 30 tablet 2  . aspirin-acetaminophen-caffeine (EXCEDRIN MIGRAINE) 103-159-45 MG tablet Excedrin Migraine     No current facility-administered medications for this visit.     Allergies: Amlodipine; Food; and Lisinopril  Past Medical History:  Diagnosis Date  . Hypertension   . Migraines     Past Surgical History:  Procedure Laterality Date  . APPENDECTOMY    . BREAST ENHANCEMENT SURGERY      Family History  Problem Relation Age of Onset  . Hypertension Father   . Healthy Mother   . Healthy Maternal Grandmother   . Colon cancer Maternal Grandfather   . Healthy Paternal Grandfather     Social History   Tobacco Use  . Smoking status: Never Smoker  . Smokeless tobacco: Never Used  Substance Use Topics  . Alcohol use: Yes    Alcohol/week: 0.6 oz    Types: 1 Glasses of wine per week    Subjective:  Patient presents for refill on blood pressure medications; stable on Chlorthalidone and Toprol; Denies any chest pain, shortness of breath, blurred vision or headache. Notes that 4 weeks ago- had breast implants replaced; worried that implant is leaking- "would just like reassurance today" since she has not been able to get in touch with her surgeon today; denies any pain or swelling in right breast ( breast of concern); Is  up to date on mammogram, pap smear, GYN care; last set of labs done here in 2017;   Objective:  Vitals:   07/13/17 1616  BP: 134/80  Pulse: 71  Temp: 98.3 F (36.8 C)  TempSrc: Oral  SpO2: 100%  Weight: 135 lb 1.9 oz (61.3 kg)  Height: '5\' 1"'  (1.549 m)    General: Well developed, well nourished, in no acute distress  Skin : Warm and dry.  Head: Normocephalic and atraumatic  Lungs: Respirations unlabored; clear to auscultation bilaterally without wheeze, rales, rhonchi  CVS exam: normal rate and regular rhythm.  Neurologic: Alert and oriented; speech intact; face symmetrical; moves all extremities well; CNII-XII intact without focal deficit  Assessment:  1. Essential hypertension     Plan:  Stable; refills updated; check CBC, CMP today;  Encouraged patient to follow-up with her plastic surgeon on Monday; do not feel area of concern on right breast is emergent today;   Return in about 4 months (around 11/12/2017) for follow-up with Ashleigh.  Orders Placed This Encounter  Procedures  . CBC w/Diff    Standing Status:   Future    Standing Expiration Date:   07/13/2018  . Comp Met (CMET)    Standing Status:   Future    Standing Expiration Date:   07/13/2018    Requested Prescriptions   Signed Prescriptions Disp Refills  . chlorthalidone (HYGROTON) 25 MG tablet 30 tablet 2  Sig: Take 1 tablet (25 mg total) by mouth daily.  . metoprolol succinate (TOPROL-XL) 100 MG 24 hr tablet 30 tablet 2    Sig: TAKE ONE TABLET BY MOUTH ONCE DAILY WITH OR IMMEDIATELY FOLLOWING A MEAL

## 2017-10-19 ENCOUNTER — Other Ambulatory Visit: Payer: Self-pay | Admitting: Family

## 2017-10-19 NOTE — Telephone Encounter (Signed)
Copied from CRM [id number]. Topic: Quick Communication - Rx Refill/Question >> Oct 19, 2017  9:40 AM Oneal GroutSebastian, Jennifer S wrote: Medication: metoprolol succinate (TOPROL-XL) 100 MG 24 hr tablet and chlorthalidone (HYGROTON) 25 MG tablet   Has the patient contacted their pharmacy? Yes.   (Agent: If no, request that the patient contact the pharmacy for the refill.) (Agent: If yes, when and what did the pharmacy advise?)  Preferred Pharmacy (with phone number or street name): [facility] on [address]  Agent: Please be advised that RX refills may take up to 3 business days. We ask that you follow-up with your pharmacy.

## 2017-10-20 ENCOUNTER — Other Ambulatory Visit: Payer: Self-pay | Admitting: Family

## 2017-10-20 DIAGNOSIS — I10 Essential (primary) hypertension: Secondary | ICD-10-CM

## 2017-11-16 ENCOUNTER — Encounter: Payer: Managed Care, Other (non HMO) | Admitting: Nurse Practitioner

## 2017-11-23 ENCOUNTER — Ambulatory Visit: Payer: Managed Care, Other (non HMO) | Admitting: Nurse Practitioner

## 2017-11-23 ENCOUNTER — Encounter: Payer: Self-pay | Admitting: Nurse Practitioner

## 2017-11-23 ENCOUNTER — Other Ambulatory Visit (INDEPENDENT_AMBULATORY_CARE_PROVIDER_SITE_OTHER): Payer: Managed Care, Other (non HMO)

## 2017-11-23 VITALS — BP 150/82 | HR 66 | Temp 98.2°F | Resp 16 | Ht 61.0 in | Wt 137.8 lb

## 2017-11-23 DIAGNOSIS — R7309 Other abnormal glucose: Secondary | ICD-10-CM

## 2017-11-23 DIAGNOSIS — I10 Essential (primary) hypertension: Secondary | ICD-10-CM | POA: Diagnosis not present

## 2017-11-23 DIAGNOSIS — L80 Vitiligo: Secondary | ICD-10-CM

## 2017-11-23 DIAGNOSIS — R899 Unspecified abnormal finding in specimens from other organs, systems and tissues: Secondary | ICD-10-CM

## 2017-11-23 LAB — COMPREHENSIVE METABOLIC PANEL
ALT: 24 U/L (ref 0–35)
AST: 21 U/L (ref 0–37)
Albumin: 4.3 g/dL (ref 3.5–5.2)
Alkaline Phosphatase: 62 U/L (ref 39–117)
BILIRUBIN TOTAL: 0.4 mg/dL (ref 0.2–1.2)
BUN: 18 mg/dL (ref 6–23)
CHLORIDE: 101 meq/L (ref 96–112)
CO2: 32 meq/L (ref 19–32)
Calcium: 9.7 mg/dL (ref 8.4–10.5)
Creatinine, Ser: 0.87 mg/dL (ref 0.40–1.20)
GFR: 73.93 mL/min (ref >60.00)
Glucose, Bld: 118 mg/dL — ABNORMAL HIGH (ref 70–99)
Potassium: 3.8 mEq/L (ref 3.5–5.1)
SODIUM: 139 meq/L (ref 135–145)
TOTAL PROTEIN: 7.9 g/dL (ref 6.0–8.3)

## 2017-11-23 LAB — HEMOGLOBIN A1C: HEMOGLOBIN A1C: 6.1 % (ref 4.6–6.5)

## 2017-11-23 NOTE — Progress Notes (Signed)
Name: Sharon Friedman   MRN: [id number]    DOB: 1970-12-11   Date:11/23/2017       Progress Note  Subjective  Chief Complaint  Chief Complaint  Patient presents with  . Establish Care    blood pressure meds    HPI [name] is here today to establish care, transferring from another provider in the same practice. Aside from primary care, she routinely follows with GYN for womens care and IUD management. She is here today for routine follow up of blood pressure. We will also discuss vitiligo and recheck abnormal lab work noted at her last visit on 07/13/17.  Hypertension -maintained on chlorthalidone 25, metoprolol succinate 100 daily, has been on these medications for some time now. Reports daily medication compliance without noted adverse medication effects. Reports she routinely checks blood pressure readings at home, with readings below 140/90.  She took excedrin with caffeine for a headache just prior to her appointment today and feels that is why her blood pressure is elevated. Denies syncope, weakness, vision changes, chest pain, shortness of breath, edema.  BP Readings from Last 3 Encounters:  11/23/17 (!) 150/82  07/13/17 134/80  11/14/16 (!) 152/96   Vitiligo- This is not a new problem She has suffered from vitiligo for years She feels that her vitiligo has been getting worse recently, especially when she is out in the sun She always wears sunscreen and tries to avoid sun but also enjoys biking and is exposed to sunlight whilebiking She went to dermatology years ago and was told there were not many options for treatment but is interested in referral back to dermatology to discuss any new options that might be available to her  Patient Active Problem List   Diagnosis Date Noted  . Vitiligo 11/14/2016  . Essential hypertension 07/13/2014  . Migraines 07/13/2014    Past Surgical History:  Procedure Laterality Date  . APPENDECTOMY    . BREAST ENHANCEMENT SURGERY       Family History  Problem Relation Age of Onset  . Hypertension Father   . Healthy Mother   . Healthy Maternal Grandmother   . Colon cancer Maternal Grandfather   . Healthy Paternal Grandfather     Social History   Socioeconomic History  . Marital status: Married    Spouse name: Not on file  . Number of children: 3  . Years of education: 7  . Highest education level: Not on file  Occupational History  . Occupation: Social research officer, government  Social Needs  . Financial resource strain: Not on file  . Food insecurity:    Worry: Not on file    Inability: Not on file  . Transportation needs:    Medical: Not on file    Non-medical: Not on file  Tobacco Use  . Smoking status: Never Smoker  . Smokeless tobacco: Never Used  Substance and Sexual Activity  . Alcohol use: Yes    Alcohol/week: 0.6 oz    Types: 1 Glasses of wine per week  . Drug use: No  . Sexual activity: Yes    Birth control/protection: Condom  Lifestyle  . Physical activity:    Days per week: Not on file    Minutes per session: Not on file  . Stress: Not on file  Relationships  . Social connections:    Talks on phone: Not on file    Gets together: Not on file    Attends religious service: Not on file    Active member of  club or organization: Not on file    Attends meetings of clubs or organizations: Not on file    Relationship status: Not on file  . Intimate partner violence:    Fear of current or ex partner: Not on file    Emotionally abused: Not on file    Physically abused: Not on file    Forced sexual activity: Not on file  Other Topics Concern  . Not on file  Social History Narrative   Fun: [employer]   Denies religious beliefs that would effect healthcare.      Current Outpatient Medications:  .  aspirin-acetaminophen-caffeine (EXCEDRIN MIGRAINE) 250-250-65 MG tablet, Excedrin Migraine, Disp: , Rfl:  .  chlorthalidone (HYGROTON) 25 MG tablet, TAKE 1 TABLET BY MOUTH ONCE DAILY, Disp: 90 tablet, Rfl:  0 .  levonorgestrel (MIRENA, 52 MG,) 20 MCG/24HR IUD, Mirena 20 mcg/24 hours (5 yrs) 52 mg intrauterine device  Take 1 device by intrauterine route., Disp: , Rfl:  .  metoprolol succinate (TOPROL-XL) 100 MG 24 hr tablet, TAKE 1 TABLET BY MOUTH ONCE DAILY WITH MEAL OR IMMEDIATELY FOLLOWING A MEAL, Disp: 30 tablet, Rfl: 1  Allergies  Allergen Reactions  . Amlodipine     Dizziness    . Food Swelling    Walnut and hazelnut cause tongue "to break out in cuts" and tongue swells  . Lisinopril Itching and Swelling    Face gets red, swells up, and itches     ROS See HPI  Objective  Vitals:   11/23/17 1513  BP: (!) 150/82  Pulse: 66  Resp: 16  Temp: 98.2 F (36.8 C)  TempSrc: Oral  SpO2: 98%  Weight: 137 lb 12.8 oz (62.5 kg)  Height: 5\' 1"  (1.549 m)    Body mass index is 26.04 kg/m.  Physical Exam Vital signs reviewed. Constitutional: Patient appears well-developed and well-nourished. No distress.  HENT: Head: Normocephalic and atraumatic. Nose: Nose normal. Mouth/Throat: Oropharynx is clear and moist. No oropharyngeal exudate.  Eyes: Conjunctivae and EOM are normal. Pupils are equal, round, and reactive to light. No scleral icterus.  Neck: Normal range of motion. Neck supple.  Cardiovascular: Normal rate, regular rhythm and normal heart sounds.  No murmur heard. No BLE edema. Distal pulses intact. Pulmonary/Chest: Effort normal and breath sounds normal. No respiratory distress. Neurological:She is alert and oriented to person, place, and time. No cranial nerve deficit. Coordination, balance, strength, speech and gait are normal.  Skin: Skin is warm and dry. No rash noted. No erythema. Vitiligo noted to face, arms, hands. Psychiatric: Patient has a normal mood and affect. behavior is normal. Judgment and thought content normal.   Assessment & Plan RTC in 3 months for F/U: HTN- recheck BP, CPE with fasting labs  Abnormal laboratory test, Abnormal glucose Abnormal calcium  and glucose level noted on 07/13/17 labs She was not fasting then, is not fasting today Will update CMET for trend and check A1c - Comprehensive metabolic panel; Future - Hemoglobin A1c; Future

## 2017-11-23 NOTE — Assessment & Plan Note (Signed)
-   Ambulatory referral to Dermatology

## 2017-11-23 NOTE — Assessment & Plan Note (Signed)
BP elevated today with reported normal home readings Will continue current medications and Continue to monitor at home RTC in 3 months for F/U: recheck BP, return sooner for readings >140/90

## 2017-11-23 NOTE — Patient Instructions (Signed)
Please try to check your blood pressure once daily or at least a few times a week, at the same time each day, and keep a log. Blood pressure goal is less than 140/90  I have placed a referral to dermatology. Our office will begin processing this referral. Please follow up if you have not heard anything about this referral within 10 days.  Please return in about 3 months, we can recheck your blood pressure and do annual physical if you would like

## 2017-12-22 ENCOUNTER — Other Ambulatory Visit: Payer: Self-pay | Admitting: Family

## 2018-01-26 ENCOUNTER — Other Ambulatory Visit: Payer: Self-pay | Admitting: Family

## 2018-01-26 DIAGNOSIS — I10 Essential (primary) hypertension: Secondary | ICD-10-CM

## 2018-02-22 ENCOUNTER — Encounter: Payer: Managed Care, Other (non HMO) | Admitting: Nurse Practitioner

## 2018-03-04 ENCOUNTER — Encounter: Payer: Managed Care, Other (non HMO) | Admitting: Nurse Practitioner

## 2018-03-04 ENCOUNTER — Telehealth: Payer: Self-pay | Admitting: Nurse Practitioner

## 2018-03-04 NOTE — Telephone Encounter (Signed)
Okay with me 

## 2018-03-04 NOTE — Telephone Encounter (Signed)
Patient requesting to transfer due to [facility] office being closer to her.  Please advise.

## 2018-03-04 NOTE — Telephone Encounter (Signed)
ok 

## 2018-03-05 NOTE — Telephone Encounter (Signed)
Patient scheduled with [place].

## 2018-03-22 ENCOUNTER — Encounter: Payer: Self-pay | Admitting: Family Medicine

## 2018-03-22 ENCOUNTER — Ambulatory Visit: Payer: Managed Care, Other (non HMO) | Admitting: Family Medicine

## 2018-03-22 VITALS — BP 160/70 | HR 71 | Temp 97.8°F | Wt 137.8 lb

## 2018-03-22 DIAGNOSIS — Z13220 Encounter for screening for lipoid disorders: Secondary | ICD-10-CM | POA: Diagnosis not present

## 2018-03-22 DIAGNOSIS — G4484 Primary exertional headache: Secondary | ICD-10-CM

## 2018-03-22 DIAGNOSIS — G5792 Unspecified mononeuropathy of left lower limb: Secondary | ICD-10-CM | POA: Diagnosis not present

## 2018-03-22 MED ORDER — METOPROLOL SUCCINATE ER 100 MG PO TB24: ORAL_TABLET | ORAL | 1 refills | Status: DC

## 2018-03-22 NOTE — Patient Instructions (Addendum)
General 01/22/2018 1:20 PM  Dr Leonie ManStinehelfer  05/16/18 @ 1210pm -  Note   [facility] [address] [place] [address] [phone]          Increase chlorthalidone to 50mg  (2 tabs) daily. Continue to monitor blood pressure at home. Report numbers back to me when we check in with you.    DASH Eating Plan DASH stands for "Dietary Approaches to Stop Hypertension." The DASH eating plan is a healthy eating plan that has been shown to reduce high blood pressure (hypertension). It may also reduce your risk for type 2 diabetes, heart disease, and stroke. The DASH eating plan may also help with weight loss. What are tips for following this plan? General guidelines  Avoid eating more than 2,300 mg (milligrams) of salt (sodium) a day. If you have hypertension, you may need to reduce your sodium intake to 1,500 mg a day.  Limit alcohol intake to no more than 1 drink a day for nonpregnant women and 2 drinks a day for men. One drink equals 12 oz of beer, 5 oz of wine, or 1 oz of hard liquor.  Work with your health care provider to maintain a healthy body weight or to lose weight. Ask what an ideal weight is for you.  Get at least 30 minutes of exercise that causes your heart to beat faster (aerobic exercise) most days of the week. Activities may include walking, swimming, or biking.  Work with your health care provider or diet and nutrition specialist (dietitian) to adjust your eating plan to your individual calorie needs. Reading food labels  Check food labels for the amount of sodium per serving. Choose foods with less than 5 percent of the Daily Value of sodium. Generally, foods with less than 300 mg of sodium per serving fit into this eating plan.  To find whole grains, look for the word "whole" as the first word in the ingredient list. Shopping  Buy products labeled as "low-sodium" or "no salt added."  Buy fresh foods. Avoid canned foods and premade or frozen  meals. Cooking  Avoid adding salt when cooking. Use salt-free seasonings or herbs instead of table salt or sea salt. Check with your health care provider or pharmacist before using salt substitutes.  Do not fry foods. Cook foods using healthy methods such as baking, boiling, grilling, and broiling instead.  Cook with heart-healthy oils, such as olive, canola, soybean, or sunflower oil. Meal planning   Eat a balanced diet that includes: ? 5 or more servings of fruits and vegetables each day. At each meal, try to fill half of your plate with fruits and vegetables. ? Up to 6-8 servings of whole grains each day. ? Less than 6 oz of lean meat, poultry, or fish each day. A 3-oz serving of meat is about the same size as a deck of cards. One egg equals 1 oz. ? 2 servings of low-fat dairy each day. ? A serving of nuts, seeds, or beans 5 times each week. ? Heart-healthy fats. Healthy fats called Omega-3 fatty acids are found in foods such as flaxseeds and coldwater fish, like sardines, salmon, and mackerel.  Limit how much you eat of the following: ? Canned or prepackaged foods. ? Food that is high in trans fat, such as fried foods. ? Food that is high in saturated fat, such as fatty meat. ? Sweets, desserts, sugary drinks, and other foods with added sugar. ? Full-fat dairy products.  Do not salt foods before  eating.  Try to eat at least 2 vegetarian meals each week.  Eat more home-cooked food and less restaurant, buffet, and fast food.  When eating at a restaurant, ask that your food be prepared with less salt or no salt, if possible. What foods are recommended? The items listed may not be a complete list. Talk with your dietitian about what dietary choices are best for you. Grains Whole-grain or whole-wheat bread. Whole-grain or whole-wheat pasta. Brown rice. [place] Morrow. Bulgur. Whole-grain and low-sodium cereals. Pita bread. Low-fat, low-sodium crackers. Whole-wheat flour  tortillas. Vegetables Fresh or frozen vegetables (raw, steamed, roasted, or grilled). Low-sodium or reduced-sodium tomato and vegetable juice. Low-sodium or reduced-sodium tomato sauce and tomato paste. Low-sodium or reduced-sodium canned vegetables. Fruits All fresh, dried, or frozen fruit. Canned fruit in natural juice (without added sugar). Meat and other protein foods Skinless chicken or [place]. Ground chicken or [place]. Pork with fat trimmed off. Fish and seafood. Egg whites. Dried beans, peas, or lentils. Unsalted nuts, nut butters, and seeds. Unsalted canned beans. Lean cuts of beef with fat trimmed off. Low-sodium, lean deli meat. Dairy Low-fat (1%) or fat-free (skim) milk. Fat-free, low-fat, or reduced-fat cheeses. Nonfat, low-sodium ricotta or cottage cheese. Low-fat or nonfat yogurt. Low-fat, low-sodium cheese. Fats and oils Soft margarine without trans fats. Vegetable oil. Low-fat, reduced-fat, or light mayonnaise and salad dressings (reduced-sodium). Canola, safflower, olive, soybean, and sunflower oils. Avocado. Seasoning and other foods Herbs. Spices. Seasoning mixes without salt. Unsalted popcorn and pretzels. Fat-free sweets. What foods are not recommended? The items listed may not be a complete list. Talk with your dietitian about what dietary choices are best for you. Grains Baked goods made with fat, such as croissants, muffins, or some breads. Dry pasta or rice meal packs. Vegetables Creamed or fried vegetables. Vegetables in a cheese sauce. Regular canned vegetables (not low-sodium or reduced-sodium). Regular canned tomato sauce and paste (not low-sodium or reduced-sodium). Regular tomato and vegetable juice (not low-sodium or reduced-sodium). Angie Fava. Olives. Fruits Canned fruit in a light or heavy syrup. Fried fruit. Fruit in cream or butter sauce. Meat and other protein foods Fatty cuts of meat. Ribs. Fried meat. Berniece Salines. Sausage. Bologna and other processed lunch meats.  Salami. Fatback. Hotdogs. Bratwurst. Salted nuts and seeds. Canned beans with added salt. Canned or smoked fish. Whole eggs or egg yolks. Chicken or [place] with skin. Dairy Whole or 2% milk, cream, and half-and-half. Whole or full-fat cream cheese. Whole-fat or sweetened yogurt. Full-fat cheese. Nondairy creamers. Whipped toppings. Processed cheese and cheese spreads. Fats and oils Butter. Stick margarine. Lard. Shortening. Ghee. Bacon fat. Tropical oils, such as coconut, palm kernel, or palm oil. Seasoning and other foods Salted popcorn and pretzels. Onion salt, garlic salt, seasoned salt, table salt, and sea salt. Worcestershire sauce. Tartar sauce. Barbecue sauce. Teriyaki sauce. Soy sauce, including reduced-sodium. Steak sauce. Canned and packaged gravies. Fish sauce. Oyster sauce. Cocktail sauce. Horseradish that you find on the shelf. Ketchup. Mustard. Meat flavorings and tenderizers. Bouillon cubes. Hot sauce and Tabasco sauce. Premade or packaged marinades. Premade or packaged taco seasonings. Relishes. Regular salad dressings. Where to find more information:  National Heart, Lung, and [place]: https://wilson-eaton.com/  American Heart Association: www.heart.org Summary  The DASH eating plan is a healthy eating plan that has been shown to reduce high blood pressure (hypertension). It may also reduce your risk for type 2 diabetes, heart disease, and stroke.  With the DASH eating plan, you should limit salt (sodium) intake to 2,300 mg a  day. If you have hypertension, you may need to reduce your sodium intake to 1,500 mg a day.  When on the DASH eating plan, aim to eat more fresh fruits and vegetables, whole grains, lean proteins, low-fat dairy, and heart-healthy fats.  Work with your health care provider or diet and nutrition specialist (dietitian) to adjust your eating plan to your individual calorie needs. This information is not intended to replace advice given to you by your health  care provider. Make sure you discuss any questions you have with your health care provider. Document Released: 04/06/2011 Document Revised: 04/10/2016 Document Reviewed: 04/10/2016 Elsevier Interactive Patient Education  [employer].

## 2018-03-22 NOTE — Progress Notes (Signed)
Sharon Friedman DOB: 10/10/1970 Encounter date: 03/22/2018  This is a 47 y.o. female who presents to establish care. Chief Complaint  Patient presents with  . Transitions Of Care    discuss labs    History of present illness: She cycles and runs. Stopped running because 6 weeks ago when she would run left leg was getting numb. When she looked down noted large vein left leg. Stated about golf ball size swelling anterior lateral lower leg. Swelling resolved quickly once she stopped running. Noticed with running after she tripped and and felt that foot wasn't lifting properly. Only with running. Not with cycling. Not with walking. Started when she had started running after longer break from running.   Also gets a sharp "brain freeze" type pain on left side of head. Tends to notes on days when she runs. Hasn't noted with other exercise, but hasn't tracked it. It does come every time she runs. Has seemed to come up in this year. Lasts 15-20 seconds. Doesn't happen while running. Usually after within 15 minutes as she is cooling down. Then will have 1-3 more episodes in next couple of hours. Not with cycling. Not with sexual activity.   Has htn and one of things that she feels works well for her is to stay active. Even with medication still runs high. States that at home usually in 140-150 range. Medication she takes she feels works well for her. If she forgets to take medication she will get headache the next day. Currently on toprol 100mg  daily and chlorthalidone. Allergic reaction to amlodipine and lisinopril in past. Has had significant issues with the way medications make her feel in past and prefers to limit medication use. Has been on treatment for htn for at least 5 years.  Gets migraines periodically; excedrin works well for her. Gets them about 4x/year. Usually stress related. Can lose parts of visual field with migraines - some spots in vision. Keeps excedrin on hand. Much better than they  used to be. Has had recent eye exam - astigmatism and getting more far-sighted.  [facility]   Past Medical History:  Diagnosis Date  . Hypertension   . Migraines    Past Surgical History:  Procedure Laterality Date  . APPENDECTOMY    . BREAST ENHANCEMENT SURGERY    . EYE SURGERY     lifted cornea (as child done for astigmatism)   Allergies  Allergen Reactions  . Amlodipine     Dizziness    . Food Swelling    Walnut and hazelnut cause tongue "to break out in cuts" and tongue swells  . Lisinopril Itching and Swelling    Face gets red, swells up, and itches   Current Meds  Medication Sig  . aspirin-acetaminophen-caffeine (EXCEDRIN MIGRAINE) 250-250-65 MG tablet Excedrin Migraine  . chlorthalidone (HYGROTON) 25 MG tablet TAKE 1 TABLET BY MOUTH ONCE DAILY  . levonorgestrel (MIRENA, 52 MG,) 20 MCG/24HR IUD Mirena 20 mcg/24 hours (5 yrs) 52 mg intrauterine device  Take 1 device by intrauterine route.  . metoprolol succinate (TOPROL-XL) 100 MG 24 hr tablet TAKE 1 TABLET BY MOUTH ONCE DAILY WITH MEALS  . [DISCONTINUED] metoprolol succinate (TOPROL-XL) 100 MG 24 hr tablet TAKE 1 TABLET BY MOUTH ONCE DAILY WITH MEALS   Social History   Tobacco Use  . Smoking status: Never Smoker  . Smokeless tobacco: Never Used  Substance Use Topics  . Alcohol use: Yes    Alcohol/week: 1.0  standard drinks    Types: 1 Glasses of wine per week   Family History  Problem Relation Age of Onset  . Hypertension Father   . Hypertension Mother   . Colon cancer Maternal Grandfather   . Healthy Paternal Grandfather   . Lupus Sister 22       (diagnosed age 45)  . Kidney failure Sister   . Liver disease Sister   . Vitiligo Maternal Uncle      Review of Systems  Constitutional: Negative for chills, fatigue and fever.  Respiratory: Negative for cough, chest tightness, shortness of breath and wheezing.   Cardiovascular: Negative for chest pain, palpitations  and leg swelling.    Objective:  BP (!) 160/70 (BP Location: Left Arm, Patient Position: Sitting, Cuff Size: Normal)   Pulse 71   Temp 97.8 F (36.6 C) (Oral)   Wt 137 lb 12.8 oz (62.5 kg)   SpO2 99%   BMI 26.04 kg/m   Weight: 137 lb 12.8 oz (62.5 kg)   BP Readings from Last 3 Encounters:  03/22/18 (!) 160/70  11/23/17 (!) 150/82  07/13/17 134/80   Wt Readings from Last 3 Encounters:  03/22/18 137 lb 12.8 oz (62.5 kg)  11/23/17 137 lb 12.8 oz (62.5 kg)  07/13/17 135 lb 1.9 oz (61.3 kg)    Physical Exam  Constitutional: She is oriented to person, place, and time. She appears well-developed and well-nourished. No distress.  HENT:  Head: Normocephalic and atraumatic.  Eyes: Pupils are equal, round, and reactive to light. Conjunctivae are normal.  Neck: Neck supple. No thyromegaly present.  Cardiovascular: Normal rate and regular rhythm. Exam reveals gallop. Exam reveals no friction rub.  No murmur heard. No lower extremity edema  Pulmonary/Chest: Effort normal and breath sounds normal. No respiratory distress. She has no wheezes. She has no rales.  Abdominal: Soft. Bowel sounds are normal. She exhibits no distension. There is no tenderness. There is no guarding.  Musculoskeletal:  No obvious swelling left lower leg. She does have pictures of the "prominence" she describes that she took when running last which is a swelling lower lateral anterior left leg.   Neurological: She is alert and oriented to person, place, and time.  Reflex Scores:      Patellar reflexes are 2+ on the right side and 2+ on the left side.      Achilles reflexes are 2+ on the right side and 2+ on the left side. Normal monofilament exam bilat feet  Psychiatric: Her behavior is normal. Cognition and memory are normal.    Assessment/Plan: 1. Primary exertional headache Headache is atypical because it is coming after running only, but not associated with other exertional activities like cycling or sexual  activity.  Reducibility in severity of head pain warrants further evaluation.  I suggested MRI today, but patient is concerned about cost of imaging.  We then discussed neurology referral and getting their opinion which she is agreeable to.  I think this is reasonable since she also has a left lower extremity neuropathy that occurs with running. - Ambulatory referral to Neurology  2. Neuropathy of left lower extremity Certain etiology of this.  There appears to be some sort of edema potentially causing some nerve compression in the left lower extremity.  Symptoms only occur when running and not with other activity.  No obvious vascular abnormality of this area or current abnormality appreciated on exam.  We will certainly have neurology evaluate this.  I will also try to touch  base with orthopedics to get their input.  3. Lipid screening - Lipid panel; Future   Return pending labwork, specialty evaluation.  Theodis Shove, MD

## 2018-04-19 ENCOUNTER — Other Ambulatory Visit: Payer: Self-pay | Admitting: Family Medicine

## 2018-04-19 DIAGNOSIS — I10 Essential (primary) hypertension: Secondary | ICD-10-CM

## 2018-04-19 NOTE — Telephone Encounter (Signed)
Copied from CRM [phone]. Topic: Quick Communication - Rx Refill/Question >> Apr 19, 2018  3:31 PM Gerrianne ScalePayne, Jenisha Faison L wrote: Medication:chlorthalidone (HYGROTON) 25 MG tablet    [facility] [address]- [place], [place] - [address] [phone] (Phone) [phone] (Fax)    ran out because provider told her to double up  Has the patient contacted their pharmacy? Yes.   (Agent: If no, request that the patient contact the pharmacy for the refill.) (Agent: If yes, when and what did the pharmacy advise?) to call provider  Preferred Pharmacy (with phone number or street name): [facility] [address] , [place] - [address] [phone] (Phone) [phone] (Fax)    Agent: Please be advised that RX refills may take up to 3 business days. We ask that you follow-up with your pharmacy.

## 2018-04-19 NOTE — Telephone Encounter (Signed)
Returned call to pt for verification of how she is taking Chlorthalidone 25 mg tablet. Pt states she was advised by Dr. Hassan RowanKoberlein to increase dose to 50 mg daily. Pt states she has been taking the medication twice a day, one tab in the morning and one tab at night. Pt states when she takes Chlorothalidone 5 0mg  at one time it makes her sleepy. Pt will need [facility] [address] Pt also request that 25 mg be dispensed because she is not able to tolerate taking 50 mg at once.

## 2018-04-22 MED ORDER — CHLORTHALIDONE 25 MG PO TABS: 25.0000 mg | ORAL_TABLET | Freq: Two times a day (BID) | ORAL | 0 refills | Status: DC

## 2018-04-22 NOTE — Telephone Encounter (Signed)
Last fill 01/28/18,  Last Ov 03/22/18  Rx has never been filled by Dr.Koberlein.  Ok to fill?

## 2018-05-28 ENCOUNTER — Telehealth: Payer: Self-pay

## 2018-05-28 NOTE — Telephone Encounter (Signed)
Copied from CRM [id number]. Topic: General - Other >> May 28, 2018 11:19 AM Percival SpanishKennedy, Cheryl W wrote:  Pt call to say her son tested positive for flu and is asking if she can request some tamuflu >> May 28, 2018  2:23 PM Windy KalataMichael, Taylor L, NT wrote: Patient calling back in regards to this.  >> May 28, 2018  3:23 PM Reather Laurenceose, Jamie R, RN wrote: Barton DuboisHey Austan Nicholl. Will Dr. Hassan RowanKoberlein treat prophylactically?

## 2018-05-29 MED ORDER — OSELTAMIVIR PHOSPHATE 75 MG PO CAPS: 75.0000 mg | ORAL_CAPSULE | Freq: Two times a day (BID) | ORAL | 0 refills | Status: DC

## 2018-05-29 NOTE — Telephone Encounter (Signed)
Pt called to check on this.  Asked questions from provider: Any symptoms?  Yes.  I am extremely tired, some headache, a little bit of a runny nose but that is not bad. And if so how many days?  This has been going on since yesterday.  Pt states son stared having a fever Sunday night and he tested positive for Flu B yesterday.  Pt can be reached at [phone].

## 2018-05-29 NOTE — Telephone Encounter (Signed)
Any symptoms? And if so how many days? If no sx, then I am ok with tamiflu 75mg  daily x 7 days. If symptoms let me know duration/severity.

## 2018-05-29 NOTE — Telephone Encounter (Signed)
Rx has been sent  

## 2018-05-29 NOTE — Telephone Encounter (Signed)
Ok for tamiflu 75mg  BID x 5 days

## 2018-06-11 ENCOUNTER — Encounter: Payer: Self-pay | Admitting: Neurology

## 2018-06-11 ENCOUNTER — Ambulatory Visit: Payer: Managed Care, Other (non HMO) | Admitting: Neurology

## 2018-06-11 VITALS — BP 162/82 | HR 69 | Ht 61.0 in | Wt 138.0 lb

## 2018-06-11 DIAGNOSIS — G4453 Primary thunderclap headache: Secondary | ICD-10-CM

## 2018-06-11 DIAGNOSIS — M7918 Myalgia, other site: Secondary | ICD-10-CM | POA: Diagnosis not present

## 2018-06-11 DIAGNOSIS — G43809 Other migraine, not intractable, without status migrainosus: Secondary | ICD-10-CM

## 2018-06-11 DIAGNOSIS — R51 Headache: Secondary | ICD-10-CM

## 2018-06-11 DIAGNOSIS — R519 Headache, unspecified: Secondary | ICD-10-CM | POA: Insufficient documentation

## 2018-06-11 DIAGNOSIS — M5481 Occipital neuralgia: Secondary | ICD-10-CM | POA: Insufficient documentation

## 2018-06-11 DIAGNOSIS — H93A9 Pulsatile tinnitus, unspecified ear: Secondary | ICD-10-CM | POA: Diagnosis not present

## 2018-06-11 DIAGNOSIS — R29898 Other symptoms and signs involving the musculoskeletal system: Secondary | ICD-10-CM

## 2018-06-11 DIAGNOSIS — R531 Weakness: Secondary | ICD-10-CM

## 2018-06-11 MED ORDER — METHYLPREDNISOLONE ACETATE 80 MG/ML IJ SUSP
80.0000 mg | Freq: Once | INTRAMUSCULAR | Status: AC
  Administered 2018-06-11: 80 mg via INTRAMUSCULAR

## 2018-06-11 NOTE — Progress Notes (Signed)
[facility]    Provider:  Dr Lucia Gaskins Referring Provider: Wynn Banker, MD Primary Care Provider:  Wynn Banker, MD  CC:  Headaches  HPI:  Sharon Friedman is a 48 y.o. female here as requested by provider Wynn Banker, MD for headaches. They started last year when she was running. She is a runner and she was really scared and she got a "brain freeze" headache. She has sensitivity on the left above the left eye and back to the occipital area. She was running, left foot started getting numb and weak, and then she couldn't lift her foot and one of her veins was a golf ball and she had an acute severe headache like a brain freeze. When she stopped running her left leg felt better but the headache started. Severe and fast, lasted briefly. She walked for a little bit, she tried running and the leg started hurting again and she walked home. She saw her pcp about it. She has reduced her running, last November was the last time vein in the leg swelled when she last ran. However the headaches continue, they are random, starts on the left and shoots from the occipital to the forehead with sensitivity at the base of her skull. It is increasing. Severity is the same. Becoming more frequent, no pattern. She is not on the computer a lot. She has not been able to assess triggers. She also hears a pulsing in the left ear and tinnitus. No other focal neurologic deficits, associated symptoms, inciting events or modifiable factors. No migrainous features.  Reviewed notes, labs and imaging from outside physicians, which showed:  Reviewed DG cervical spine images 2009 which was normal.  Review of Systems: Patient complains of symptoms per HPI as well as the following symptoms: headaces. Pertinent negatives and positives per HPI. All others negative.   Social History   Socioeconomic History  . Marital status: Married    Spouse name: Not on file  . Number of children: 3  .  Years of education: 35  . Highest education level: Not on file  Occupational History  . Occupation: Social research officer, government  Social Needs  . Financial resource strain: Not on file  . Food insecurity:    Worry: Not on file    Inability: Not on file  . Transportation needs:    Medical: Not on file    Non-medical: Not on file  Tobacco Use  . Smoking status: Never Smoker  . Smokeless tobacco: Never Used  Substance and Sexual Activity  . Alcohol use: Yes    Alcohol/week: 1.0 standard drinks    Types: 1 Glasses of wine per week  . Drug use: No  . Sexual activity: Yes    Birth control/protection: Condom  Lifestyle  . Physical activity:    Days per week: Not on file    Minutes per session: Not on file  . Stress: Not on file  Relationships  . Social connections:    Talks on phone: Not on file    Gets together: Not on file    Attends religious service: Not on file    Active member of club or organization: Not on file    Attends meetings of clubs or organizations: Not on file    Relationship status: Not on file  . Intimate partner violence:    Fear of current or ex partner: Not on file    Emotionally abused: Not on file    Physically abused: Not on file  Forced sexual activity: Not on file  Other Topics Concern  . Not on file  Social History Narrative   Fun: Cycle   Denies religious beliefs that would effect healthcare.     Family History  Problem Relation Age of Onset  . Hypertension Father   . Hypertension Mother   . Colon cancer Maternal Grandfather   . Healthy Paternal Grandfather   . Lupus Sister 60       (diagnosed age 48)  . Kidney failure Sister   . Liver disease Sister   . Vitiligo Maternal Uncle     Past Medical History:  Diagnosis Date  . Hypertension   . Migraines     Patient Active Problem List   Diagnosis Date Noted  . Occipital headache 06/11/2018  . left sided temporal headache 06/11/2018  . Occipital neuralgia 06/11/2018  . Vitiligo 11/14/2016    . Essential hypertension 07/13/2014  . Migraine 07/13/2014    Past Surgical History:  Procedure Laterality Date  . APPENDECTOMY    . BREAST ENHANCEMENT SURGERY    . DILATION AND CURETTAGE OF UTERUS    . EYE SURGERY     lifted cornea (as child done for astigmatism)    Current Outpatient Medications  Medication Sig Dispense Refill  . Aspirin-Acetaminophen-Caffeine (EXCEDRIN EXTRA STRENGTH PO) Excedrin Migraine    . chlorthalidone (HYGROTON) 25 MG tablet Take 1 tablet (25 mg total) by mouth 2 (two) times daily. 180 tablet 0  . metoprolol succinate (TOPROL-XL) 100 MG 24 hr tablet TAKE 1 TABLET BY MOUTH ONCE DAILY WITH MEALS 90 tablet 1   No current facility-administered medications for this visit.     Allergies as of 06/11/2018 - Review Complete 06/11/2018  Allergen Reaction Noted  . Amlodipine  09/09/2014  . Food Swelling 05/09/2011  . Lisinopril Itching and Swelling 08/14/2014    Vitals: BP (!) 162/82   Pulse 69   Ht 5\' 1"  (1.549 m)   Wt 138 lb (62.6 kg)   BMI 26.07 kg/m  Last Weight:  Wt Readings from Last 1 Encounters:  06/11/18 138 lb (62.6 kg)   Last Height:   Ht Readings from Last 1 Encounters:  06/11/18 5\' 1"  (1.549 m)     Physical exam: Exam: Gen: NAD, conversant, well nourised, well groomed                     CV: RRR, no MRG. No Carotid Bruits. No peripheral edema, warm, nontender Eyes: Conjunctivae clear without exudates or hemorrhage  Neuro: Detailed Neurologic Exam  Speech:    Speech is normal; fluent and spontaneous with normal comprehension.  Cognition:    The patient is oriented to person, place, and time;     recent and remote memory intact;     language fluent;     normal attention, concentration,     fund of knowledge Cranial Nerves:    The pupils are equal, round, and reactive to light. The fundi are normal and spontaneous venous pulsations are present. Visual fields are full to finger confrontation. Extraocular movements are intact.  Trigeminal sensation is intact and the muscles of mastication are normal. The face is symmetric. The palate elevates in the midline. Hearing intact. Voice is normal. Shoulder shrug is normal. The tongue has normal motion without fasciculations.   Coordination:    Normal finger to nose and heel to shin. Normal rapid alternating movements.   Gait:    Heel-toe and tandem gait are normal.   Motor Observation:  No asymmetry, no atrophy, and no involuntary movements noted. Tone:    Normal muscle tone.    Posture:    Posture is normal. normal erect    Strength:    Strength is V/V in the upper and lower limbs.      Sensation: intact to LT     Reflex Exam:  DTR's:    Deep tendon reflexes in the upper and lower extremities are normal bilaterally.   Toes:    The toes are downgoing bilaterally.   Clonus:    Clonus is absent.    Assessment/Plan:  48 year old with probable occipital neuralgia. However given exertional headaches and severe acute (thunderclap) headache needs a thorough evaluation.   MRI of the brain and MRA of the head to evaluate for acute onset left leg weakness and thunderclap headache, exertional headache for stroke, leaking or unruptured aneurysm, space-occupying mass, chiari malformation, or other intracranial etiology.  - needs management of her blood pressure, elevated today  - For the enlarged vein recommend f/u with pcp and possible referral to vascular and vein.  - nerve blocks today, she responded in an excellent fashion with significant improvement  - Physical therapy for occipital neuralgia and myofascial cervical muscle pain syndrome  - If symptoms do not improve you can come back for another nerve block or we can send you to Dr. Naaman Plummer for other interventional occipital procedures (radiofrequency ablation of the occipital nerve)  Orders Placed This Encounter  Procedures  . MR BRAIN W WO CONTRAST  . MR MRA HEAD WO CONTRAST  . Basic Metabolic  Panel  . Ambulatory referral to Physical Therapy   Meds ordered this encounter  Medications  . methylPREDNISolone acetate (DEPO-MEDROL) injection 80 mg   Performed by Dr. Lucia Gaskins M.D. 30-gauge needle was used. All procedures a documented below were medically necessary, reasonable and appropriate based on the patient's history, medical diagnosis and physician opinion. Verbal informed consent was obtained from the patient, patient was informed of potential risk of procedure, including bruising, bleeding, hematoma formation, infection, muscle weakness, muscle pain, numbness, transient hypertension, transient hyperglycemia and transient insomnia among others. All areas injected were topically clean with isopropyl rubbing alcohol. Nonsterile nonlatex gloves were worn during the procedure.  1. Greater occipital nerve block [phone]). The greater occipital nerve site was identified at the nuchal line medial to the occipital artery. Medication was injected into the left occipital nerve areas and suboccipital areas. Patient's condition is associated with inflammation of the greater occipital nerve and associated multiple groups. Injection was deemed medically necessary, reasonable and appropriate. Injection represents a separate and unique surgical service.  2. Supraorbital nerve block ([id number]): Supraorbital nerve site was identified along the incision of the frontal bone on the orbital/supraorbital ridge. Medication was injected into the left supraorbital nerve areas. Patient's condition is associated with inflammation of the supraorbital and associated muscle groups. Injection was deemed medically necessary, reasonable and appropriate. Injection represents a separate and unique surgical service.  Discussed: To prevent or relieve headaches, try the following: Cool Compress. Lie down and place a cool compress on your head.  Avoid headache triggers. If certain foods or odors seem to have triggered your migraines in  the past, avoid them. A headache diary might help you identify triggers.  Include physical activity in your daily routine. Try a daily walk or other moderate aerobic exercise.  Manage stress. Find healthy ways to cope with the stressors, such as delegating tasks on your to-do list.  Practice relaxation  techniques. Try deep breathing, yoga, massage and visualization.  Eat regularly. Eating regularly scheduled meals and maintaining a healthy diet might help prevent headaches. Also, drink plenty of fluids.  Follow a regular sleep schedule. Sleep deprivation might contribute to headaches Consider biofeedback. With this mind-body technique, you learn to control certain bodily functions - such as muscle tension, heart rate and blood pressure - to prevent headaches or reduce headache pain.    Proceed to emergency room if you experience new or worsening symptoms or symptoms do not resolve, if you have new neurologic symptoms or if headache is severe, or for any concerning symptom.   Provided education and documentation from American headache Society toolbox including articles on: occipital neuralgia, chronic migraine medication overuse headache, chronic migraines, prevention of migraines, behavioral and other nonpharmacologic treatments for headache.   Cc: Wynn BankerKoberlein, Junell C, MD,    Naomie DeanAntonia Laurice Iglesia, MD  [facility] [address] [place], [place] [id number]  Phone [phone] Fax [phone]

## 2018-06-11 NOTE — Patient Instructions (Addendum)
Had nerve blocks today Physical therapy for occipital neuralgia and myofascial cervical muscle pain If symptoms do not improve you can come back for another nerve block or we can send you to Dr. Naaman Plummer for other interventional occipital procedures (radiofrequency ablation of the occipital nerve) MRI of the brain and MRA of the head One lab today   Occipital Neuralgia  Occipital neuralgia is a type of headache that causes brief episodes of very bad pain in the back of your head. Pain from occipital neuralgia may spread (radiate) to other parts of your head. These headaches may be caused by irritation of the nerves that leave your spinal cord high up in your neck, just below the base of your skull (occipital nerves). Your occipital nerves transmit sensations from the back of your head, the top of your head, and the areas behind your ears. What are the causes? This condition can occur without any known cause (primary headache syndrome). In other cases, this condition is caused by pressure on or irritation of one of the two occipital nerves. Pressure and irritation may be due to:  Muscle spasm in the neck.  Neck injury.  Wear and tear of the vertebrae in the neck (osteoarthritis).  Disease of the disks that separate the vertebrae.  Swollen blood vessels that put pressure on the occipital nerves.  Infections.  Tumors.  Diabetes. What are the signs or symptoms? This condition causes brief burning, stabbing, electric, shocking, or shooting pain which can radiate to the top of the head. It can happen on one side or both sides of the head. It can also cause:  Pain behind the eye.  Pain triggered by neck movement or hair brushing.  Scalp tenderness.  Aching in the back of the head between episodes of very bad pain.  Pain gets worse with exposure to bright lights. How is this diagnosed? There is no test that diagnoses this condition. Your health care provider may diagnose this  condition based on a physical exam and your symptoms. Other tests may be done, such as:  Imaging studies of the brain and neck (cervical spine), such as an MRI or CT scan. These look for causes of pinched nerves.  Applying pressure to the nerves in the neck to try to re-create the pain.  Injection of numbing medicine into the occipital nerve areas to see if pain goes away (diagnostic nerve block). How is this treated? Treatment for this condition may begin with simple measures, such as:  Rest.  Massage.  Applying heat or cold on the area.  Over-the-counter pain relievers. If these measures do not work, you may need other treatments, including:  Medicines, such as: ? Prescription-strength anti-inflammatory medicines. ? Muscle relaxants. ? Anti-seizure medicines, which can relieve pain. ? Antidepressants, which can relieve pain. ? Injected medicines, such as medicines that numb the area (local anesthetic) and steroids.  Pulsed radiofrequency ablation. This is when wires are implanted to deliver electrical impulses that block pain signals from the occipital nerve.  Surgery to relieve nerve pressure.  Physical therapy. Follow these instructions at home: Pain management      Avoid any activities that cause pain.  Rest when you have an attack of pain.  Try gentle massage to relieve pain.  Try a different pillow or sleeping position.  If directed, apply heat to the affected area as told by your health care provider. Use the heat source that your health care provider recommends, such as a moist heat pack or a  heating pad. ? Place a towel between your skin and the heat source. ? Leave the heat on for 20-30 minutes. ? Remove the heat if your skin turns bright red. This is especially important if you are unable to feel pain, heat, or cold. You may have a greater risk of getting burned.  If directed, apply ice to the back of the head and neck area as told by your health care  provider. ? Put ice in a plastic bag. ? Place a towel between your skin and the bag. ? Leave the ice on for 20 minutes, 2-3 times per day. General instructions  Take over-the-counter and prescription medicines only as told by your health care provider.  Avoid things that make your symptoms worse, such as bright lights.  Try to stay active. Get regular exercise that does not cause pain. Ask your health care provider to suggest safe exercises for you.  Work with a physical therapist to learn stretching exercises you can do at home.  Practice good posture.  Keep all follow-up visits as told by your health care provider. This is important. Contact a health care provider if:  Your medicine is not working.  You have new or worsening symptoms. Get help right away if:  You have very bad head pain that does not go away.  You have a sudden change in vision, balance, or speech. Summary  Occipital neuralgia is a type of headache that causes brief episodes of very bad pain in the back of your head.  Pain from occipital neuralgia may spread (radiate) to other parts of your head.  Treatment for this condition includes rest, massage, and medicines. This information is not intended to replace advice given to you by your health care provider. Make sure you discuss any questions you have with your health care provider. Document Released: 04/11/2001 Document Revised: 04/03/2017 Document Reviewed: 06/22/2016 Elsevier Interactive Patient Education  2019 [employer]vier Inc.  Occipital Nerve Block Patient Information  Description: The occipital nerves originate in the cervical (neck) spinal cord and travel upward through muscle and tissue to supply sensation to the back of the head and top of the scalp.  In addition, the nerves control some of the muscles of the scalp.  Occipital neuralgia is an irritation of these nerves which can cause headaches, numbness of the scalp, and neck discomfort.     The  occipital nerve block will interrupt nerve transmission through these nerves and can relieve pain and spasm.  The block consists of insertion of a small needle under the skin in the back of the head to deposit local anesthetic (numbing medicine) and/or steroids around the nerve.  The entire block usually lasts less than 5 minutes.  Conditions which may be treated by occipital blocks:   Muscular pain and spasm of the scalp  Nerve irritation, back of the head  Headaches  Upper neck pain  Preparation for the injection:  1. Do not eat any solid food or dairy products within 8 hours of your appointment. 2. You may drink clear liquids up to 3 hours before appointment.  Clear liquids include water, black coffee, juice or soda.  No milk or cream please. 3. You may take your regular medication, including pain medications, with a sip of water before you appointment.  Diabetics should hold regular insulin (if taken separately) and take 1/2 normal NPH dose the morning of the procedure.  Carry some sugar containing items with you to your appointment. 4. A driver must accompany  you and be prepared to drive you home after your procedure. 5. Bring all your current medications with you. 6. An IV may be inserted and sedation may be given at the discretion of the physician. 7. A blood pressure cuff, EKG, and other monitors will often be applied during the procedure.  Some patients may need to have extra oxygen administered for a short period. 8. You will be asked to provide medical information, including your allergies and medications, prior to the procedure.  We must know immediately if you are taking blood thinners (like Coumadin/Warfarin) or if you are allergic to IV iodine contrast (dye).  We must know if you could possible be pregnant.  9. Do not wear a high collared shirt or turtleneck.  Tie long hair up in the back if possible.  Possible side-effects:   Bleeding from needle site  Infection (rare,  may require surgery)  Nerve injury (rare)  Hair on back of neck can be tinged with iodine scrub (this will wash out)  Light-headedness (temporary)  Pain at injection site (several days)  Decreased blood pressure (rare, temporary)  Seizure (very rare)  Call if you experience:   Hives or difficulty breathing ( go to the emergency room)  Inflammation or drainage at the injection site(s)  Please note:  Although the local anesthetic injected can often make your painful muscles or headache feel good for several hours after the injection, the pain may return.  It takes 3-7 days for steroids to work.  You may not notice any pain relief for at least one week.  If effective, we will often do a series of injections spaced 3-6 weeks apart to maximally decrease your pain.  If you have any questions, please call [phone] [facility]

## 2018-06-11 NOTE — Progress Notes (Signed)
Nerve block w/ steroid: Pt signed consent  0.5% Bupivocaine 2.5 mL LOT: [id number] EXP: 11/2019 NDC: 10175-102-58  2% Lidocaine 2.5 mL LOT: [id number] EXP: 11/30/2018 NDC: 5277-8242-35  Methylprednisolone 80 mg/mL See MAR

## 2018-06-12 ENCOUNTER — Telehealth: Payer: Self-pay | Admitting: *Deleted

## 2018-06-12 ENCOUNTER — Telehealth: Payer: Self-pay | Admitting: Neurology

## 2018-06-12 LAB — BASIC METABOLIC PANEL
BUN / CREAT RATIO: 21 (ref 9–23)
BUN: 15 mg/dL (ref 6–24)
CALCIUM: 10.1 mg/dL (ref 8.7–10.2)
CO2: 26 mmol/L (ref 20–29)
Chloride: 97 mmol/L (ref 96–106)
Creatinine, Ser: 0.71 mg/dL (ref 0.57–1.00)
GFR calc non Af Amer: 101 mL/min/{1.73_m2} (ref >59)
GFR, EST AFRICAN AMERICAN: 116 mL/min/{1.73_m2} (ref >59)
GLUCOSE: 117 mg/dL — AB (ref 65–99)
Potassium: 4 mmol/L (ref 3.5–5.2)
SODIUM: 141 mmol/L (ref 134–144)

## 2018-06-12 NOTE — Telephone Encounter (Signed)
Spoke with pt and discussed lab results. Patient verbalized understanding and appreciation.

## 2018-06-12 NOTE — Telephone Encounter (Signed)
-----   Message from Anson Fret, MD sent at 06/12/2018  9:36 AM EST ----- Lab looks fine thanks, slightly increased glucose but this was not fasting thanks

## 2018-06-12 NOTE — Telephone Encounter (Signed)
[employer] order sent to GI. They will obtain the auth and reach out to the pt to schedule.  °

## 2018-06-24 ENCOUNTER — Other Ambulatory Visit: Payer: Self-pay

## 2018-06-24 ENCOUNTER — Ambulatory Visit: Payer: Managed Care, Other (non HMO) | Admitting: Physical Therapy

## 2018-06-24 ENCOUNTER — Ambulatory Visit: Payer: Managed Care, Other (non HMO) | Attending: Neurology | Admitting: Physical Therapy

## 2018-06-24 ENCOUNTER — Encounter: Payer: Self-pay | Admitting: Physical Therapy

## 2018-06-24 DIAGNOSIS — M62838 Other muscle spasm: Secondary | ICD-10-CM | POA: Insufficient documentation

## 2018-06-24 DIAGNOSIS — M542 Cervicalgia: Secondary | ICD-10-CM | POA: Insufficient documentation

## 2018-06-24 DIAGNOSIS — R293 Abnormal posture: Secondary | ICD-10-CM | POA: Diagnosis present

## 2018-06-24 NOTE — Therapy (Addendum)
[place], [place], [address] Phone: [phone]   Fax:  [phone]  Physical Therapy Evaluation / Discharge  Patient Details  Name: Sharon Friedman MRN: [id number] Date of Birth: 1971-03-27 Referring Provider (PT): Sarina Ill MD   Encounter Date: 06/24/2018  PT End of Session - 06/24/18 1102    Visit Number  1    Number of Visits  7    Date for PT Re-Evaluation  08/05/18    PT Start Time  0957    PT Stop Time  1054    PT Time Calculation (min)  57 min    Activity Tolerance  Patient tolerated treatment well    Behavior During Therapy  [facility] for tasks assessed/performed       Past Medical History:  Diagnosis Date  . Hypertension   . Migraines     Past Surgical History:  Procedure Laterality Date  . APPENDECTOMY    . BREAST ENHANCEMENT SURGERY    . DILATION AND CURETTAGE OF UTERUS    . EYE SURGERY     lifted cornea (as child done for astigmatism)    There were no vitals filed for this visit.   Subjective Assessment - 06/24/18 1002    Subjective  pt is a 48 y.o F with CC of neck pain with chronic HA starting about a 9-10 months ago, with no specific onset. she reports hx of migraines all her life. she describe it feels like a brain freeze that occur suddenly. She reports Tinnitis in the L ear with associated dizziness that will occur with this. she notes some tingling in the tongue and lips. Since onset the symptoms seem to worsening in frequency.     How long can you sit comfortably?  unlimited    How long can you stand comfortably?  unlimited    How long can you walk comfortably?  unlimited    Diagnostic tests  N/A ordered but reports she hasn't been contacted to schedule the MRI's    Patient Stated Goals  to release the pain, manage the symptoms and prevent it,     Currently in Pain?  Yes    Pain Score  1    at worst 9/10   Pain Location  Neck    Pain Descriptors / Indicators   Aching;Sore    Pain Type  Chronic pain    Pain Radiating Towards  HA    Pain Onset  More than a month ago    Pain Frequency  Intermittent    Aggravating Factors   N/A    Pain Relieving Factors  medication, sitting and resting,          [facility] PT Assessment - 06/24/18 0958      Assessment   Medical Diagnosis  Occipital headache, Right sided temporal headache,  Occipital neuralgia, unspecified laterality, Myofascial pain syndrome, cervical    Referring Provider (PT)  Sarina Ill MD    Onset Date/Surgical Date  --   September 2019   Hand Dominance  --   ambidextrous   Next MD Visit  make one PRN    Prior Therapy  no      Precautions   Precautions  None      Restrictions   Weight Bearing Restrictions  No      Balance Screen   Has the patient fallen in the past 6 months  No    Has the patient had a decrease in activity level because  of a fear of falling?   No    Is the patient reluctant to leave their home because of a fear of falling?   No      Home Film/video editor residence      Prior Function   Level of Independence  Independent    Vocation  Full time employment   trainer, public speaker,    Vocation Requirements  prolonged sitting/ standing, driving    Leisure  biking, reading, being outdoors      Cognition   Overall Cognitive Status  Within Functional Limits for tasks assessed      Observation/Other Assessments   Focus on Therapeutic Outcomes (FOTO)   44% limited    predicted 31%     Sensation   Light Touch  Impaired Detail    Proprioception  Impaired Detail    Additional Comments  increased sensitivty along the L cranial nerve V along the opthalmic branch      Posture/Postural Control   Posture/Postural Control  Postural limitations    Postural Limitations  Rounded Shoulders;Forward head      ROM / Strength   AROM / PROM / Strength  AROM;Strength      AROM   AROM Assessment Site  Cervical    Cervical Flexion  60    Cervical  Extension  32    Cervical - Right Side Bend  40   end range pain noted on the L    Cervical - Left Side Bend  40    Cervical - Right Rotation  70    Cervical - Left Rotation  68      Palpation   Palpation comment  TTP along the L upper trap, levator scapulae, and the proxmal scalenes                Objective measurements completed on examination: See above findings.      [facility] Adult PT Treatment/Exercise - 06/24/18 0958      Neck Exercises: Seated   Neck Retraction  5 reps;5 secs    Other Seated Exercise  self first rib mob with sheet x 5 combined with deep breathing      Manual Therapy   Manual therapy comments  MTPR along the L upper trap/ levator scapulae and scalenes    Joint Mobilization  L first rib mobs grade III    Soft tissue mobilization  sub-occipital release x 5 min      Neck Exercises: Stretches   Upper Trapezius Stretch  30 seconds;1 rep;Left    Levator Stretch  Left;1 rep;30 seconds             PT Education - 06/24/18 1003    Education Details  evaluation findings, POC, goals, HEP with proper form/ rationale.    Person(s) Educated  Patient    Methods  Verbal cues;Explanation;Handout    Comprehension  Verbalized understanding;Verbal cues required       PT Short Term Goals - 06/24/18 1118      PT SHORT TERM GOAL #1   Title  pt to be I with inital HEP    Time  3    Period  Weeks    Status  New    Target Date  07/15/18        PT Long Term Goals - 06/24/18 1123      PT LONG TERM GOAL #1   Title  pt to decrease upper trap and surrounding musculature to reduce pain/  HA and promote QOL.    Time  6    Period  Weeks    Status  New    Target Date  08/05/18      PT LONG TERM GOAL #2   Title  pt to report decreased occurence of HA/ symptoms for >/= 1 week for improvement of condition    Time  6    Period  Weeks    Status  New    Target Date  08/05/18      PT LONG TERM GOAL #3   Title  pt to verbalize / demo proper posture and  lifting mechanics to reduce and prevent neck pain and HA    Time  6    Period  Weeks    Status  New    Target Date  08/05/18      PT LONG TERM GOAL #4   Title  pt to return to running/ biking reporting no pain or tightness for personal goals.    Time  6    Period  Weeks    Status  New    Target Date  08/05/18      PT LONG TERM GOAL #5   Title  increase FOTO score to </= 31% limited to demo improvement in function.    Time  6    Period  Weeks    Status  New    Target Date  08/05/18             Plan - 06/24/18 1058    Clinical Impression Statement  pt presents to OPPT with CC of L sided Headache/ myofascial pain starting back in September of 2019 with no MOI, she has a hx of migraines which she manages with excedrin. She demonstrates functional cervical mobility in all planes with report of pain with R sidebending. TTP along the upper trap/ levator scapulae and scalenes on the L and exhibits and forward head position. Following STW focusing on the L upper trap, levator scapulae and scaleneds as well as rib mobs she noted significant relief of tension and pain. pt opted to hold off on TPDN today due to traveling later. she would benefit from physical therapy to decrease L muscle spasm/ tightness, reduce incidence of HA, promote good posture / lifting mechancis and return to PLOF by addressing the deficits listed.     History and Personal Factors relevant to plan of care:  Hx of migraines,     Clinical Presentation  Evolving    Clinical Presentation due to:  symptoms are worsening in frequency, HA, abnormal posture, muscle spasm    Clinical Decision Making  Moderate    Rehab Potential  Good    PT Frequency  1x / week    PT Duration  6 weeks    PT Treatment/Interventions  ADLs/Self Care Home Management;Cryotherapy;Electrical Stimulation;Iontophoresis 73m/ml Dexamethasone;Moist Heat;Traction;Therapeutic exercise;Therapeutic activities;Taping;Dry needling;Manual  techniques;Patient/family education;Passive range of motion    PT Next Visit Plan  review/ update HEP, DN and STW, cervical mobs/ distraction, posture education, lower trap strengthening, can trial inhibition taping    PT Home Exercise Plan  chin tuck supine and sitting, upper trap stretching, levator scapulae stretch, rib mobs.     Consulted and Agree with Plan of Care  Patient       Patient will benefit from skilled therapeutic intervention in order to improve the following deficits and impairments:  Pain, Increased fascial restricitons, Increased muscle spasms, Postural dysfunction, Improper body mechanics, Decreased activity tolerance, Decreased endurance  Visit Diagnosis: Cervicalgia  Abnormal posture  Other muscle spasm     Problem List Patient Active Problem List   Diagnosis Date Noted  . Occipital headache 06/11/2018  . left sided temporal headache 06/11/2018  . Occipital neuralgia 06/11/2018  . Vitiligo 11/14/2016  . Essential hypertension 07/13/2014  . Migraine 07/13/2014   Starr Lake PT, DPT, LAT, ATC  06/24/18  11:49 AM      [facility] [address] [place], [place], [address] Phone: [phone]   Fax:  [phone]  Name: Sharon Friedman MRN: [id number] Date of Birth: 11-09-1970      PHYSICAL THERAPY DISCHARGE SUMMARY  Visits from Start of Care: 1  Current functional level related to goals / functional outcomes: See goals   Remaining deficits: unknown   Education / Equipment: HEP  Plan: Patient agrees to discharge.  Patient goals were not met. Patient is being discharged due to not returning since the last visit.  ?????         Abbeygail Igoe PT, DPT, LAT, ATC  12/25/18  1:19 PM

## 2018-07-05 ENCOUNTER — Encounter: Payer: Managed Care, Other (non HMO) | Admitting: Physical Therapy

## 2018-07-08 ENCOUNTER — Ambulatory Visit: Payer: Managed Care, Other (non HMO) | Admitting: Physical Therapy

## 2018-07-15 ENCOUNTER — Telehealth: Payer: Self-pay | Admitting: Physical Therapy

## 2018-07-15 ENCOUNTER — Ambulatory Visit: Payer: Managed Care, Other (non HMO) | Attending: Neurology | Admitting: Physical Therapy

## 2018-07-15 NOTE — Telephone Encounter (Signed)
Calling with no answer and mailbox is full so unable to leave message regarding missed appointment.

## 2018-07-22 ENCOUNTER — Encounter: Payer: Managed Care, Other (non HMO) | Admitting: Physical Therapy

## 2018-07-26 ENCOUNTER — Ambulatory Visit: Payer: Managed Care, Other (non HMO) | Admitting: Physical Therapy

## 2018-07-29 ENCOUNTER — Ambulatory Visit: Payer: Managed Care, Other (non HMO) | Admitting: Physical Therapy

## 2018-08-06 ENCOUNTER — Telehealth: Payer: Self-pay | Admitting: Physical Therapy

## 2018-08-06 NOTE — Telephone Encounter (Signed)
Sharon Friedman was contacted today regarding the temporary reduction of OP Rehab Services due to concerns for community transmission of Covid-19.    Therapist advised the patient to continue to perform their HEP and assured they had no unanswered questions at this time.  The patient expressed interest in being contacted for an e-visit, virtual check in, or telehealth visit to continue their POC care, when those services become available.     Outpatient Rehabilitation Services will follow up with patients at that time.

## 2018-08-30 ENCOUNTER — Telehealth: Payer: Self-pay | Admitting: Physical Therapy

## 2018-08-30 NOTE — Telephone Encounter (Signed)
LVM and informed them unfortunately their insurance benefits do not cover Telehealth. If they would like to continue with physical therapy, we can reach out to them once the clinic re-opens. If they wish to be discharged from PT, we can go ahead and discharge them. If they could call us back and let us know how they would like to proceed. In the meantime pt is aware they are welcome to reach out to us any time if they have any questions.  

## 2018-09-11 ENCOUNTER — Other Ambulatory Visit: Payer: Self-pay | Admitting: Family Medicine

## 2018-09-11 DIAGNOSIS — I10 Essential (primary) hypertension: Secondary | ICD-10-CM

## 2018-09-12 NOTE — Telephone Encounter (Signed)
Pt called to check status on refill. Advised to call pharmacy tomorrow. She plans to schedule virtual visit tomorrow if refill is not approved, in the case that virtual visit is necessary. Please advise

## 2018-09-16 ENCOUNTER — Other Ambulatory Visit: Payer: Self-pay | Admitting: Family Medicine

## 2018-09-16 DIAGNOSIS — I10 Essential (primary) hypertension: Secondary | ICD-10-CM

## 2018-12-13 ENCOUNTER — Other Ambulatory Visit: Payer: Self-pay | Admitting: *Deleted

## 2018-12-13 DIAGNOSIS — I10 Essential (primary) hypertension: Secondary | ICD-10-CM

## 2018-12-13 MED ORDER — CHLORTHALIDONE 25 MG PO TABS: 25.0000 mg | ORAL_TABLET | Freq: Two times a day (BID) | ORAL | 0 refills | Status: DC

## 2018-12-13 NOTE — Telephone Encounter (Signed)
Rx done. 

## 2019-01-14 ENCOUNTER — Ambulatory Visit: Payer: Managed Care, Other (non HMO) | Admitting: Neurology

## 2019-01-14 ENCOUNTER — Ambulatory Visit: Payer: Managed Care, Other (non HMO) | Admitting: Family Medicine

## 2019-01-14 ENCOUNTER — Telehealth: Payer: Self-pay

## 2019-01-14 NOTE — Progress Notes (Deleted)
PATIENT: Sharon Friedman DOB: 01-27-1971  REASON FOR VISIT: follow up HISTORY FROM: patient  No chief complaint on file.    HISTORY OF PRESENT ILLNESS: Today 01/14/19 Sharon Friedman is a 48 y.o. female here today for follow up of occipital neuralgia. She was seen last by Dr Jaynee Eagles in 06/2018. Nerve block given in the office offered significant relief . MRI/MRA ordered . PT? BP?   HISTORY: (copied from Dr Cathren Laine note on 06/11/2018)  HPI:  Sharon Friedman is a 48 y.o. female here as requested by provider Caren Macadam, MD for headaches. They started last year when she was running. She is a runner and she was really scared and she got a "brain freeze" headache. She has sensitivity on the left above the left eye and back to the occipital area. She was running, left foot started getting numb and weak, and then she couldn't lift her foot and one of her veins was a golf ball and she had an acute severe headache like a brain freeze. When she stopped running her left leg felt better but the headache started. Severe and fast, lasted briefly. She walked for a little bit, she tried running and the leg started hurting again and she walked home. She saw her pcp about it. She has reduced her running, last November was the last time vein in the leg swelled when she last ran. However the headaches continue, they are random, starts on the left and shoots from the occipital to the forehead with sensitivity at the base of her skull. It is increasing. Severity is the same. Becoming more frequent, no pattern. She is not on the computer a lot. She has not been able to assess triggers. She also hears a pulsing in the left ear and tinnitus. No other focal neurologic deficits, associated symptoms, inciting events or modifiable factors. No migrainous features.  Reviewed notes, labs and imaging from outside physicians, which showed:  Reviewed DG cervical spine images 2009 which was normal.   REVIEW OF  SYSTEMS: Out of a complete 14 system review of symptoms, the patient complains only of the following symptoms, and all other reviewed systems are negative.  ALLERGIES: Allergies  Allergen Reactions   Amlodipine     Dizziness     Food Swelling    Walnut and hazelnut cause tongue "to break out in cuts" and tongue swells   Lisinopril Itching and Swelling    Face gets red, swells up, and itches    HOME MEDICATIONS: Outpatient Medications Prior to Visit  Medication Sig Dispense Refill   Aspirin-Acetaminophen-Caffeine (EXCEDRIN EXTRA STRENGTH PO) Excedrin Migraine     chlorthalidone (HYGROTON) 25 MG tablet Take 1 tablet (25 mg total) by mouth 2 (two) times daily. 180 tablet 0   metoprolol succinate (TOPROL-XL) 100 MG 24 hr tablet TAKE 1 TABLET BY MOUTH ONCE DAILY WITH MEALS 90 tablet 1   No facility-administered medications prior to visit.     PAST MEDICAL HISTORY: Past Medical History:  Diagnosis Date   Hypertension    Migraines     PAST SURGICAL HISTORY: Past Surgical History:  Procedure Laterality Date   APPENDECTOMY     BREAST ENHANCEMENT SURGERY     DILATION AND CURETTAGE OF UTERUS     EYE SURGERY     lifted cornea (as child done for astigmatism)    FAMILY HISTORY: Family History  Problem Relation Age of Onset   Hypertension Father    Hypertension Mother  Colon cancer Maternal Grandfather    Healthy Paternal Grandfather    Lupus Sister 66       (diagnosed age 81)   Kidney failure Sister    Liver disease Sister    Vitiligo Maternal Uncle     SOCIAL HISTORY: Social History   Socioeconomic History   Marital status: Married    Spouse name: Not on file   Number of children: 3   Years of education: 18   Highest education level: Not on file  Occupational History   Occupation: Social research officer, government  Social Network engineer strain: Not on file   Food insecurity    Worry: Not on file    Inability: Not on file    Transportation needs    Medical: Not on file    Non-medical: Not on file  Tobacco Use   Smoking status: Never Smoker   Smokeless tobacco: Never Used  Substance and Sexual Activity   Alcohol use: Yes    Alcohol/week: 1.0 standard drinks    Types: 1 Glasses of wine per week   Drug use: No   Sexual activity: Yes    Birth control/protection: Condom  Lifestyle   Physical activity    Days per week: Not on file    Minutes per session: Not on file   Stress: Not on file  Relationships   Social connections    Talks on phone: Not on file    Gets together: Not on file    Attends religious service: Not on file    Active member of club or organization: Not on file    Attends meetings of clubs or organizations: Not on file    Relationship status: Not on file   Intimate partner violence    Fear of current or ex partner: Not on file    Emotionally abused: Not on file    Physically abused: Not on file    Forced sexual activity: Not on file  Other Topics Concern   Not on file  Social History Narrative   Fun: Cycle   Denies religious beliefs that would effect healthcare.       PHYSICAL EXAM  There were no vitals filed for this visit. There is no height or weight on file to calculate BMI.  Generalized: Well developed, in no acute distress  Cardiology: normal rate and rhythm, no murmur noted Neurological examination  Mentation: Alert oriented to time, place, history taking. Follows all commands speech and language fluent Cranial nerve II-XII: Pupils were equal round reactive to light. Extraocular movements were full, visual field were full on confrontational test. Facial sensation and strength were normal. Uvula tongue midline. Head turning and shoulder shrug  were normal and symmetric. Motor: The motor testing reveals 5 over 5 strength of all 4 extremities. Good symmetric motor tone is noted throughout.  Sensory: Sensory testing is intact to soft touch on all 4 extremities. No  evidence of extinction is noted.  Coordination: Cerebellar testing reveals good finger-nose-finger and heel-to-shin bilaterally.  Gait and station: Gait is normal. Tandem gait is normal. Romberg is negative. No drift is seen.  Reflexes: Deep tendon reflexes are symmetric and normal bilaterally.   DIAGNOSTIC DATA (LABS, IMAGING, TESTING) - I reviewed patient records, labs, notes, testing and imaging myself where available.  No flowsheet data found.   Lab Results  Component Value Date   WBC 9.7 07/13/2017   HGB 14.7 07/13/2017   HCT 43.0 07/13/2017   MCV 95.3 07/13/2017   PLT  259.0 07/13/2017      Component Value Date/Time   NA 141 06/11/2018 0907   K 4.0 06/11/2018 0907   CL 97 06/11/2018 0907   CO2 26 06/11/2018 0907   GLUCOSE 117 (H) 06/11/2018 0907   GLUCOSE 118 (H) 11/23/2017 1606   BUN 15 06/11/2018 0907   CREATININE 0.71 06/11/2018 0907   CALCIUM 10.1 06/11/2018 0907   PROT 7.9 11/23/2017 1606   ALBUMIN 4.3 11/23/2017 1606   AST 21 11/23/2017 1606   ALT 24 11/23/2017 1606   ALKPHOS 62 11/23/2017 1606   BILITOT 0.4 11/23/2017 1606   GFRNONAA 101 06/11/2018 0907   GFRAA 116 06/11/2018 0907   No results found for: CHOL, HDL, LDLCALC, LDLDIRECT, TRIG, CHOLHDL Lab Results  Component Value Date   HGBA1C 6.1 11/23/2017   No results found for: VITAMINB12 No results found for: TSH     ASSESSMENT AND PLAN 48 y.o. year old female  has a past medical history of Hypertension and Migraines. here with ***  No diagnosis found.     No orders of the defined types were placed in this encounter.    No orders of the defined types were placed in this encounter.     I spent 15 minutes with the patient. 50% of this time was spent counseling and educating patient on plan of care and medications.    Shawnie Dappermy Nester Bachus, FNP-C 01/14/2019, 9:50 AM [facility] [address], [address] [place], [place] [address] [phone]

## 2019-01-14 NOTE — Telephone Encounter (Signed)
Patient was a no call/no show for their appointment today.   

## 2019-01-15 ENCOUNTER — Encounter: Payer: Self-pay | Admitting: Family Medicine

## 2019-01-20 ENCOUNTER — Other Ambulatory Visit: Payer: Self-pay | Admitting: Family Medicine

## 2019-01-21 ENCOUNTER — Ambulatory Visit: Payer: Managed Care, Other (non HMO) | Admitting: Neurology

## 2019-01-29 ENCOUNTER — Ambulatory Visit: Payer: Managed Care, Other (non HMO) | Admitting: Neurology

## 2019-02-25 ENCOUNTER — Telehealth: Payer: Self-pay | Admitting: *Deleted

## 2019-02-25 NOTE — Telephone Encounter (Signed)
Called pt & LVM (ok per DPR) offering sooner appt with NP. Asked for call back to schedule.

## 2019-02-26 ENCOUNTER — Other Ambulatory Visit: Payer: Self-pay | Admitting: Family Medicine

## 2019-03-10 ENCOUNTER — Ambulatory Visit: Payer: Managed Care, Other (non HMO) | Admitting: Neurology

## 2019-03-20 ENCOUNTER — Other Ambulatory Visit: Payer: Self-pay

## 2019-03-20 DIAGNOSIS — R600 Localized edema: Secondary | ICD-10-CM

## 2019-03-26 ENCOUNTER — Encounter (HOSPITAL_COMMUNITY): Payer: Managed Care, Other (non HMO)

## 2019-03-26 NOTE — Progress Notes (Deleted)
[facility] [place]   Reason for referral: Swollen *** leg  History of Present Illness  Sharon Friedman is a 48 y.o. female who presents with chief complaint: swollen leg.  Patient notes, onset of swelling *** months ago, associated with ***.  The patient has had *** history of DVT, *** history of varicose vein, *** history of venous stasis ulcers, *** history of  Lymphedema and *** history of skin changes in lower legs.  There is *** family history of venous disorders.  The patient has *** used compression stockings in the past.  Past Medical History:  Diagnosis Date  . Hypertension   . Migraines     Past Surgical History:  Procedure Laterality Date  . APPENDECTOMY    . BREAST ENHANCEMENT SURGERY    . DILATION AND CURETTAGE OF UTERUS    . EYE SURGERY     lifted cornea (as child done for astigmatism)    Social History   Socioeconomic History  . Marital status: Married    Spouse name: Not on file  . Number of children: 3  . Years of education: 35  . Highest education level: Not on file  Occupational History  . Occupation: Social research officer, government  Social Needs  . Financial resource strain: Not on file  . Food insecurity    Worry: Not on file    Inability: Not on file  . Transportation needs    Medical: Not on file    Non-medical: Not on file  Tobacco Use  . Smoking status: Never Smoker  . Smokeless tobacco: Never Used  Substance and Sexual Activity  . Alcohol use: Yes    Alcohol/week: 1.0 standard drinks    Types: 1 Glasses of wine per week  . Drug use: No  . Sexual activity: Yes    Birth control/protection: Condom  Lifestyle  . Physical activity    Days per week: Not on file    Minutes per session: Not on file  . Stress: Not on file  Relationships  . Social Musician on phone: Not on file    Gets together: Not on file    Attends religious service: Not on file    Active member of club or organization: Not on file    Attends  meetings of clubs or organizations: Not on file    Relationship status: Not on file  . Intimate partner violence    Fear of current or ex partner: Not on file    Emotionally abused: Not on file    Physically abused: Not on file    Forced sexual activity: Not on file  Other Topics Concern  . Not on file  Social History Narrative   Fun: Cycle   Denies religious beliefs that would effect healthcare.     Family History  Problem Relation Age of Onset  . Hypertension Father   . Hypertension Mother   . Colon cancer Maternal Grandfather   . Healthy Paternal Grandfather   . Lupus Sister 68       (diagnosed age 45)  . Kidney failure Sister   . Liver disease Sister   . Vitiligo Maternal Uncle     Current Outpatient Medications on File Prior to Visit  Medication Sig Dispense Refill  . Aspirin-Acetaminophen-Caffeine (EXCEDRIN EXTRA STRENGTH PO) Excedrin Migraine    . chlorthalidone (HYGROTON) 25 MG tablet Take 1 tablet (25 mg total) by mouth 2 (two) times daily. 180 tablet 0  . metoprolol succinate (  TOPROL-XL) 100 MG 24 hr tablet TAKE 1 TABLET BY MOUTH ONCE DAILY WITH MEALS 30 tablet 0   No current facility-administered medications on file prior to visit.     Allergies as of 03/26/2019 - Review Complete 06/11/2018  Allergen Reaction Noted  . Amlodipine  09/09/2014  . Food Swelling 05/09/2011  . Lisinopril Itching and Swelling 08/14/2014     ROS:   General:  No weight loss, Fever, chills  HEENT: No recent headaches, no nasal bleeding, no visual changes, no sore throat  Neurologic: No dizziness, blackouts, seizures. No recent symptoms of stroke or mini- stroke. No recent episodes of slurred speech, or temporary blindness.  Cardiac: No recent episodes of chest pain/pressure, no shortness of breath at rest.  No shortness of breath with exertion.  Denies history of atrial fibrillation or irregular heartbeat  Vascular: No history of rest pain in feet.  No history of claudication.   No history of non-healing ulcer, No history of DVT   Pulmonary: No home oxygen, no productive cough, no hemoptysis,  No asthma or wheezing  Musculoskeletal:  [ ]  Arthritis, [ ]  Low back pain,  [ ]  Joint pain  Hematologic:No history of hypercoagulable state.  No history of easy bleeding.  No history of anemia  Gastrointestinal: No hematochezia or melena,  No gastroesophageal reflux, no trouble swallowing  Urinary: [ ]  chronic Kidney disease, [ ]  on HD - [ ]  MWF or [ ]  TTHS, [ ]  Burning with urination, [ ]  Frequent urination, [ ]  Difficulty urinating;   Skin: No rashes  Psychological: No history of anxiety,  No history of depression  Physical Examination  There were no vitals filed for this visit.  There is no height or weight on file to calculate BMI.  General:  Alert and oriented, no acute distress HEENT: Normal Neck: No bruit or JVD Pulmonary: Clear to auscultation bilaterally Cardiac: Regular Rate and Rhythm without murmur Abdomen: Soft, non-tender, non-distended, no mass, no scars Skin: No rash Extremity Pulses:  2+ radial, brachial, femoral, dorsalis pedis, posterior tibial pulses bilaterally Musculoskeletal: No deformity or edema  Neurologic: Upper and lower extremity motor 5/5 and symmetric  DATA:  Assessment:  Plan:  Ruta Hinds, MD [facility] [place] Office: [phone] Pager: [phone]

## 2019-06-23 ENCOUNTER — Encounter (HOSPITAL_COMMUNITY): Payer: Managed Care, Other (non HMO)

## 2019-07-16 ENCOUNTER — Ambulatory Visit (HOSPITAL_COMMUNITY): Admission: RE | Admit: 2019-07-16 | Payer: Managed Care, Other (non HMO) | Source: Ambulatory Visit

## 2019-08-27 ENCOUNTER — Other Ambulatory Visit: Payer: Self-pay | Admitting: *Deleted

## 2019-08-27 DIAGNOSIS — M25569 Pain in unspecified knee: Secondary | ICD-10-CM

## 2019-08-29 ENCOUNTER — Telehealth (HOSPITAL_COMMUNITY): Payer: Self-pay

## 2019-08-29 NOTE — Telephone Encounter (Signed)

## 2019-09-01 ENCOUNTER — Encounter (HOSPITAL_COMMUNITY): Payer: Managed Care, Other (non HMO)

## 2019-09-26 ENCOUNTER — Other Ambulatory Visit: Payer: Self-pay | Admitting: *Deleted

## 2019-09-26 DIAGNOSIS — R600 Localized edema: Secondary | ICD-10-CM

## 2019-10-01 ENCOUNTER — Inpatient Hospital Stay (HOSPITAL_COMMUNITY): Admission: RE | Admit: 2019-10-01 | Payer: Managed Care, Other (non HMO) | Source: Ambulatory Visit

## 2019-12-22 ENCOUNTER — Other Ambulatory Visit: Payer: Self-pay | Admitting: Vascular Surgery

## 2019-12-22 DIAGNOSIS — R600 Localized edema: Secondary | ICD-10-CM

## 2020-03-17 ENCOUNTER — Observation Stay (HOSPITAL_COMMUNITY)
Admission: EM | Admit: 2020-03-17 | Discharge: 2020-03-18 | Disposition: A | Discharge status: Home / Self Care | Payer: No Typology Code available for payment source | Attending: Family Medicine | Admitting: Family Medicine

## 2020-03-17 ENCOUNTER — Emergency Department (HOSPITAL_COMMUNITY): Payer: No Typology Code available for payment source

## 2020-03-17 DIAGNOSIS — R531 Weakness: Secondary | ICD-10-CM

## 2020-03-17 DIAGNOSIS — Z79899 Other long term (current) drug therapy: Secondary | ICD-10-CM | POA: Diagnosis not present

## 2020-03-17 DIAGNOSIS — E876 Hypokalemia: Secondary | ICD-10-CM | POA: Insufficient documentation

## 2020-03-17 DIAGNOSIS — Z7982 Long term (current) use of aspirin: Secondary | ICD-10-CM | POA: Diagnosis not present

## 2020-03-17 DIAGNOSIS — R079 Chest pain, unspecified: Secondary | ICD-10-CM | POA: Diagnosis not present

## 2020-03-17 DIAGNOSIS — Z20822 Contact with and (suspected) exposure to covid-19: Secondary | ICD-10-CM | POA: Insufficient documentation

## 2020-03-17 DIAGNOSIS — R778 Other specified abnormalities of plasma proteins: Secondary | ICD-10-CM

## 2020-03-17 DIAGNOSIS — I447 Left bundle-branch block, unspecified: Secondary | ICD-10-CM | POA: Insufficient documentation

## 2020-03-17 DIAGNOSIS — I10 Essential (primary) hypertension: Secondary | ICD-10-CM | POA: Diagnosis not present

## 2020-03-17 DIAGNOSIS — R748 Abnormal levels of other serum enzymes: Secondary | ICD-10-CM | POA: Insufficient documentation

## 2020-03-17 DIAGNOSIS — R519 Headache, unspecified: Secondary | ICD-10-CM | POA: Diagnosis present

## 2020-03-17 DIAGNOSIS — R0602 Shortness of breath: Secondary | ICD-10-CM | POA: Diagnosis not present

## 2020-03-17 DIAGNOSIS — G43909 Migraine, unspecified, not intractable, without status migrainosus: Secondary | ICD-10-CM | POA: Diagnosis not present

## 2020-03-17 LAB — URINALYSIS, ROUTINE W REFLEX MICROSCOPIC
Bilirubin Urine: NEGATIVE
Glucose, UA: NEGATIVE mg/dL
Hgb urine dipstick: NEGATIVE
Ketones, ur: NEGATIVE mg/dL
Nitrite: POSITIVE — AB
Protein, ur: NEGATIVE mg/dL
Specific Gravity, Urine: >1.046 — ABNORMAL HIGH (ref 1.005–1.030)
pH: 7 (ref 5.0–8.0)

## 2020-03-17 LAB — I-STAT CHEM 8, ED
BUN: 10 mg/dL (ref 6–20)
Calcium, Ion: 1.15 mmol/L (ref 1.15–1.40)
Chloride: 96 mmol/L — ABNORMAL LOW (ref 98–111)
Creatinine, Ser: 0.6 mg/dL (ref 0.44–1.00)
Glucose, Bld: 100 mg/dL — ABNORMAL HIGH (ref 70–99)
HCT: 40 % (ref 36.0–46.0)
Hemoglobin: 13.6 g/dL (ref 12.0–15.0)
Potassium: 3.5 mmol/L (ref 3.5–5.1)
Sodium: 137 mmol/L (ref 135–145)
TCO2: 28 mmol/L (ref 22–32)

## 2020-03-17 LAB — CBC WITH DIFFERENTIAL/PLATELET
Abs Immature Granulocytes: 0.04 10*3/uL (ref 0.00–0.07)
Basophils Absolute: 0.1 10*3/uL (ref 0.0–0.1)
Basophils Relative: 1 %
Eosinophils Absolute: 0.1 10*3/uL (ref 0.0–0.5)
Eosinophils Relative: 1 %
HCT: 39.4 % (ref 36.0–46.0)
Hemoglobin: 13.6 g/dL (ref 12.0–15.0)
Immature Granulocytes: 0 %
Lymphocytes Relative: 35 %
Lymphs Abs: 3.2 10*3/uL (ref 0.7–4.0)
MCH: 31.9 pg (ref 26.0–34.0)
MCHC: 34.5 g/dL (ref 30.0–36.0)
MCV: 92.3 fL (ref 80.0–100.0)
Monocytes Absolute: 0.9 10*3/uL (ref 0.1–1.0)
Monocytes Relative: 10 %
Neutro Abs: 4.7 10*3/uL (ref 1.7–7.7)
Neutrophils Relative %: 53 %
Platelets: 250 10*3/uL (ref 150–400)
RBC: 4.27 MIL/uL (ref 3.87–5.11)
RDW: 12.8 % (ref 11.5–15.5)
WBC: 9 10*3/uL (ref 4.0–10.5)
nRBC: 0 % (ref 0.0–0.2)

## 2020-03-17 LAB — COMPREHENSIVE METABOLIC PANEL
ALT: 23 U/L (ref 0–44)
AST: 21 U/L (ref 15–41)
Albumin: 4.2 g/dL (ref 3.5–5.0)
Alkaline Phosphatase: 53 U/L (ref 38–126)
Anion gap: 11 (ref 5–15)
BUN: 9 mg/dL (ref 6–20)
CO2: 30 mmol/L (ref 22–32)
Calcium: 9.6 mg/dL (ref 8.9–10.3)
Chloride: 95 mmol/L — ABNORMAL LOW (ref 98–111)
Creatinine, Ser: 0.76 mg/dL (ref 0.44–1.00)
GFR, Estimated: >60 mL/min (ref >60)
Glucose, Bld: 102 mg/dL — ABNORMAL HIGH (ref 70–99)
Potassium: 3.1 mmol/L — ABNORMAL LOW (ref 3.5–5.1)
Sodium: 136 mmol/L (ref 135–145)
Total Bilirubin: 1.1 mg/dL (ref 0.3–1.2)
Total Protein: 7.4 g/dL (ref 6.5–8.1)

## 2020-03-17 LAB — PROTIME-INR
INR: 1.3 — ABNORMAL HIGH (ref 0.8–1.2)
Prothrombin Time: 15.5 seconds — ABNORMAL HIGH (ref 11.4–15.2)

## 2020-03-17 LAB — MAGNESIUM: Magnesium: 1.8 mg/dL (ref 1.7–2.4)

## 2020-03-17 LAB — RAPID URINE DRUG SCREEN, HOSP PERFORMED
Amphetamines: NOT DETECTED
Barbiturates: NOT DETECTED
Benzodiazepines: NOT DETECTED
Cocaine: NOT DETECTED
Opiates: NOT DETECTED
Tetrahydrocannabinol: NOT DETECTED

## 2020-03-17 LAB — I-STAT BETA HCG BLOOD, ED (MC, WL, AP ONLY): I-stat hCG, quantitative: <5 m[IU]/mL (ref <5)

## 2020-03-17 LAB — APTT: aPTT: 30 seconds (ref 24–36)

## 2020-03-17 LAB — RESPIRATORY PANEL BY RT PCR (FLU A&B, COVID)
Influenza A by PCR: NEGATIVE
Influenza B by PCR: NEGATIVE
SARS Coronavirus 2 by RT PCR: NEGATIVE

## 2020-03-17 LAB — TROPONIN I (HIGH SENSITIVITY)
Troponin I (High Sensitivity): 28 ng/L — ABNORMAL HIGH (ref <18)
Troponin I (High Sensitivity): 29 ng/L — ABNORMAL HIGH (ref <18)

## 2020-03-17 LAB — ETHANOL: Alcohol, Ethyl (B): <10 mg/dL (ref <10)

## 2020-03-17 LAB — LIPASE, BLOOD: Lipase: 34 U/L (ref 11–51)

## 2020-03-17 IMAGING — CT CT ANGIO NECK
1 of 11 series · 5 of 33 positions shown · IV contrast (omnipaque)
Comparison: None.

CLINICAL DATA: Neurological deficit.  Headache.  Blurred vision.

EXAM:
CT ANGIOGRAPHY HEAD AND NECK
TECHNIQUE: Multidetector CT imaging of the head and neck was performed using
the standard protocol during bolus administration of intravenous
contrast. Multiplanar CT image reconstructions and MIPs were
obtained to evaluate the vascular anatomy. Carotid stenosis
measurements (when applicable) are obtained utilizing NASCET
criteria, using the distal internal carotid diameter as the
denominator.
CONTRAST:  50mL OMNIPAQUE IOHEXOL 350 MG/ML SOLN

[Series 12: ax thins · axial · 0.39mm/px · z∈[+1172,+1403]mm · 5 of 347 slices shown]
[im 58/347  soft-tissue]
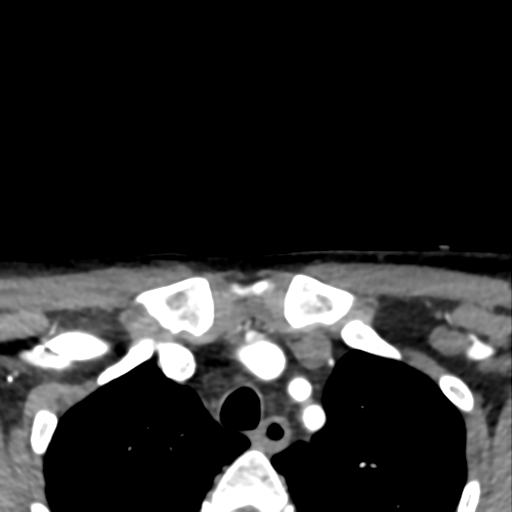
[im 116/347  bone]
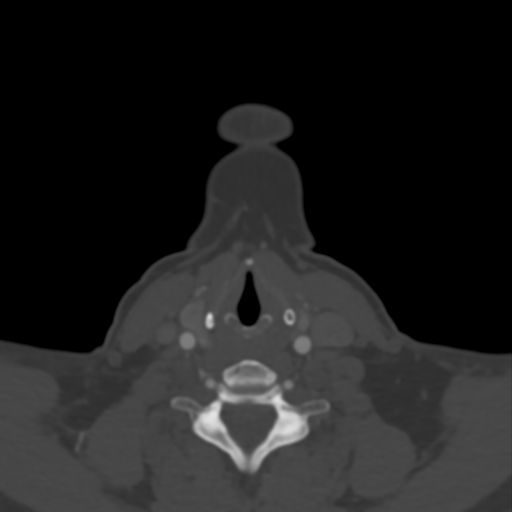
[im 174/347  soft-tissue]
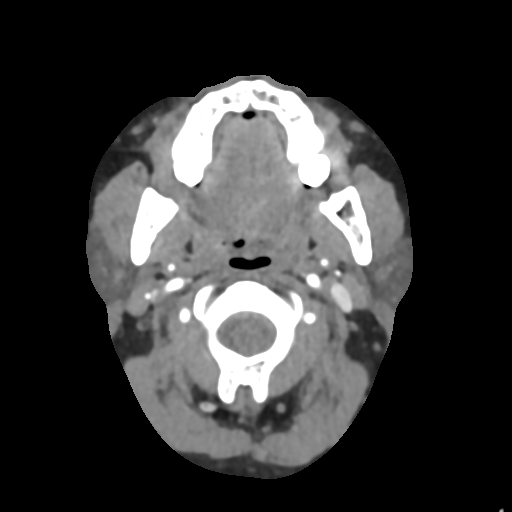
[im 231/347  bone]
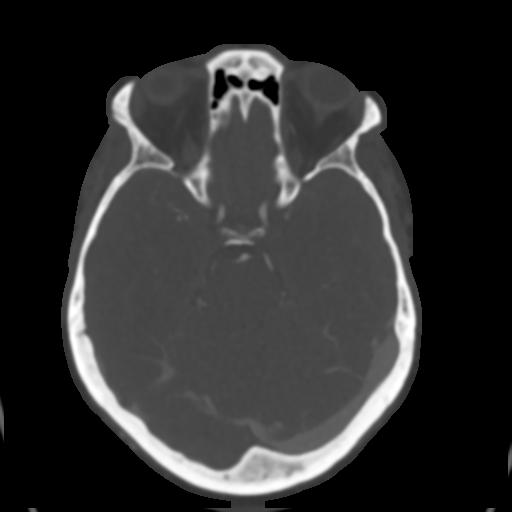
[im 289/347  soft-tissue]
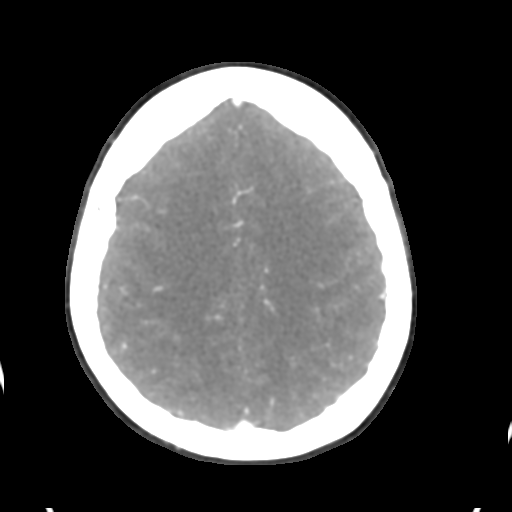

[5 of 33 positions shown; findings below may reference images not displayed]

FINDINGS: CT HEAD FINDINGS

Brain: The brain shows a normal appearance without evidence of
malformation, atrophy, old or acute small or large vessel
infarction, mass lesion, hemorrhage, hydrocephalus or extra-axial
collection.

Vascular: No hyperdense vessel. No evidence of atherosclerotic
calcification.

Skull: Normal.  No traumatic finding.  No focal bone lesion.

Sinuses/Orbits: Sinuses are clear. Orbits appear normal. Mastoids
are clear.

Other: None significant

CTA NECK FINDINGS

Aortic arch: Normal

Right carotid system: Normal. No carotid bifurcation disease.
Cervical ICA widely patent.

Left carotid system: Common carotid artery widely patent to the
bifurcation. Mild atherosclerotic plaque at the ICA bulb but no
stenosis. Cervical ICA normal beyond that.

Vertebral arteries: Vertebral artery origins are widely patent. Left
subclavian artery shows some soft plaque proximal to the vertebral
origin, but no stenosis greater than 20%. Both vertebral arteries
appear normal through the cervical region to the foramen magnum.

Skeleton: Normal

Other neck: No lymphadenopathy.  Heterogeneous enlarged thyroid.

Upper chest: Normal

Review of the MIP images confirms the above findings

CTA HEAD FINDINGS

Anterior circulation: Both internal carotid arteries widely patent
through the skull base and siphon regions. The anterior and middle
cerebral vessels are patent without large or medium vessel
occlusion, proximal stenosis, aneurysm or vascular malformation.

Posterior circulation: Both vertebral arteries widely patent to the
basilar. No basilar stenosis. Posterior circulation branch vessels
are normal.

Venous sinuses: Patent and normal.

Anatomic variants: None significant.

Review of the MIP images confirms the above findings
IMPRESSION: 1. Normal head CT.
2. No large or medium vessel occlusion.
3. No significant carotid bifurcation disease. Mild atherosclerotic
plaque at the left ICA bulb but no stenosis.
4. Left subclavian artery shows some soft plaque proximal to the
left vertebral origin, but no stenosis greater than 20%. Both
vertebral arteries appear normal through the cervical region to the
basilar.
5. Heterogeneous enlarged thyroid. Recommend thyroid ultrasound
(ref: [HOSPITAL]. [DATE]): 143-50).

## 2020-03-17 IMAGING — DX DG CHEST 1V PORT
1 series · 1 of 1 positions shown · non-contrast
Comparison: [DATE]

CLINICAL DATA: Chest pain

EXAM:
PORTABLE CHEST 1 VIEW

[chest]
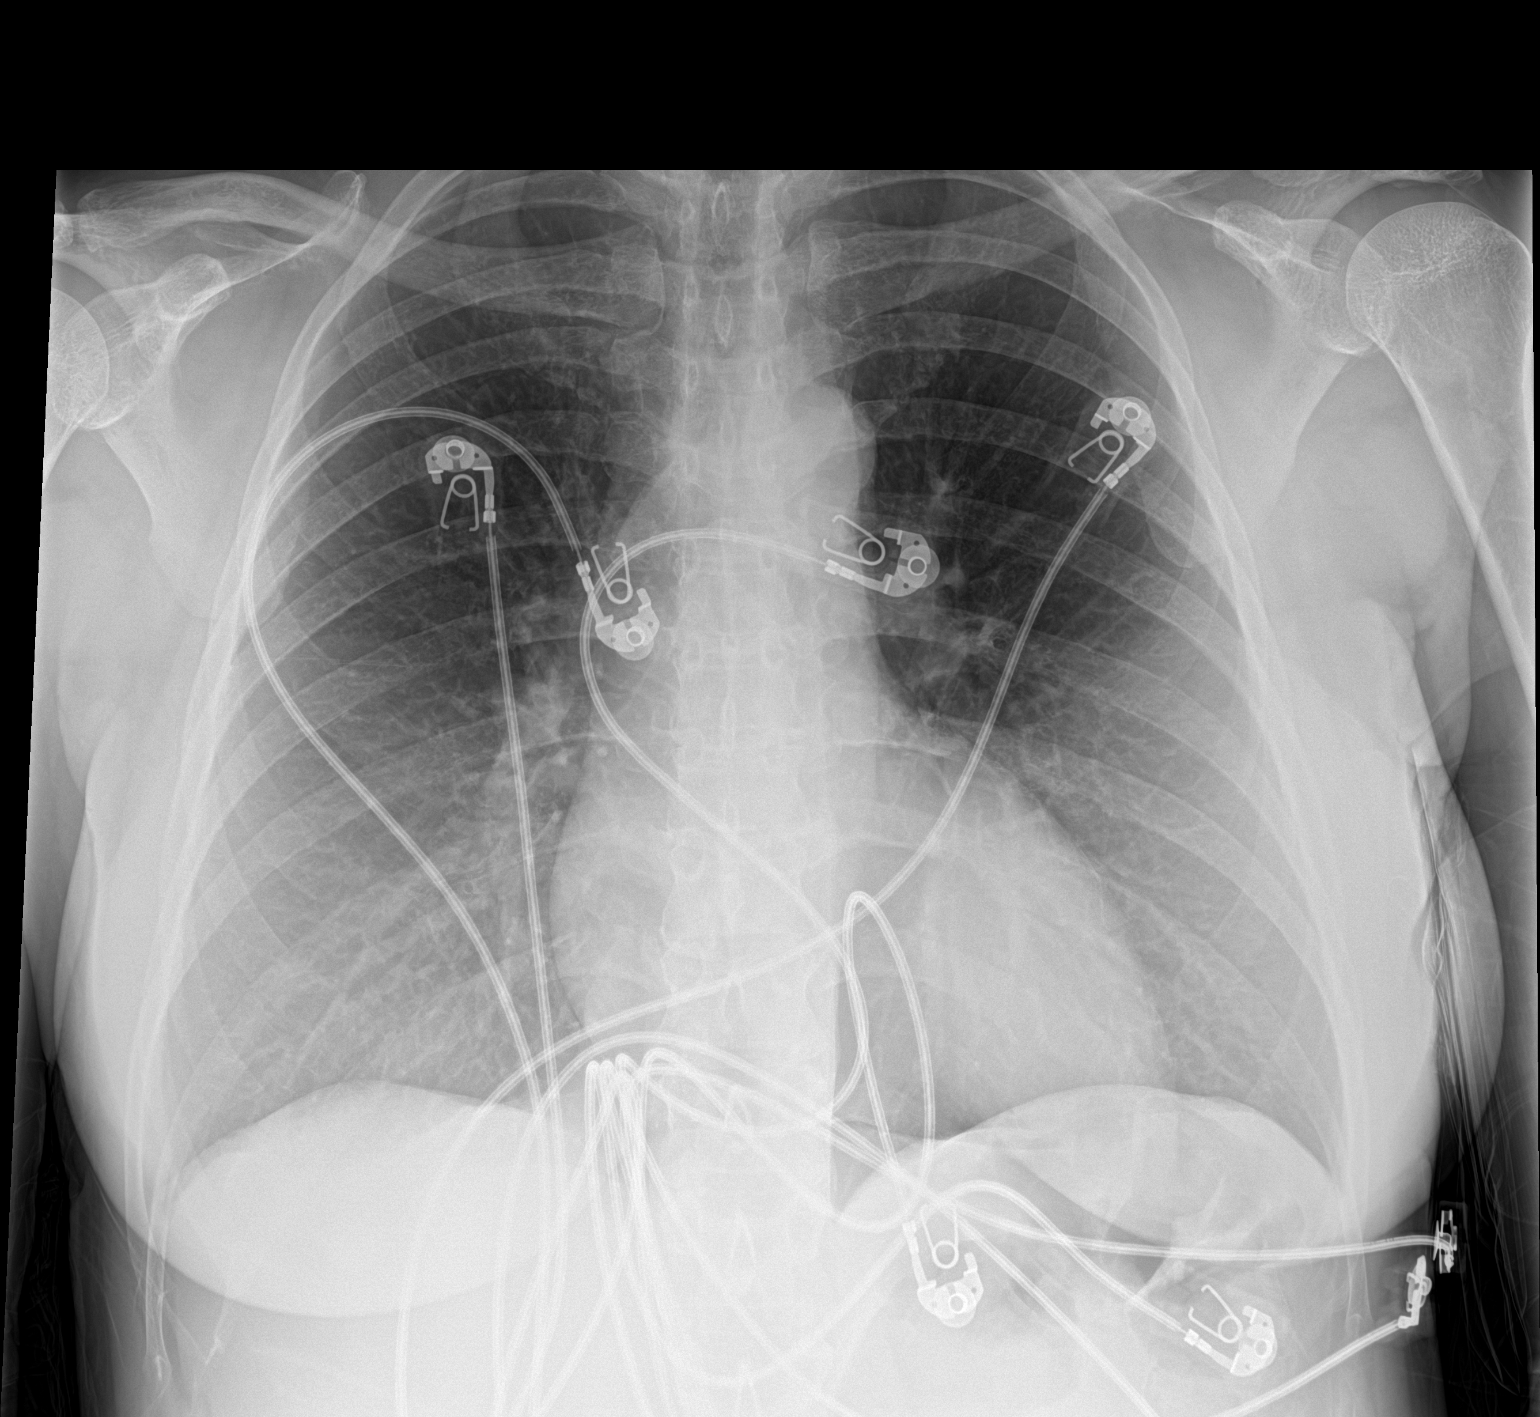

[1 of 1 positions shown; findings below may reference images not displayed]

FINDINGS: The heart size and mediastinal contours are within normal limits.
Both lungs are clear. The visualized skeletal structures are
unremarkable.
IMPRESSION: Normal study.

## 2020-03-17 IMAGING — CT CT ANGIO CHEST-ABD-PELV FOR DISSECTION W/ AND WO/W CM
2 of 7 series · 14 of 46 positions shown, 16 images · IV contrast (APPLIED)
Comparison: None.

CLINICAL DATA: Acute chest pain

EXAM:
CT ANGIOGRAPHY CHEST, ABDOMEN AND PELVIS
TECHNIQUE: Non-contrast CT of the chest was initially obtained.

[Series 10: arterial · axial · arterial · 0.70mm/px · z∈[-600,-44]mm · 11 of 316 slices shown, 13 images]
[im 19/316  soft-tissue]
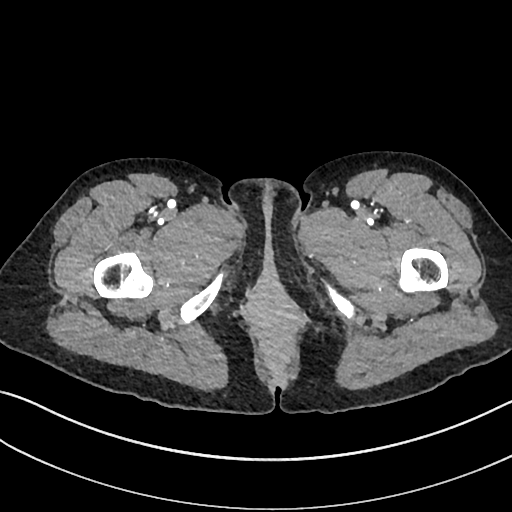
[im 19/316  bone]
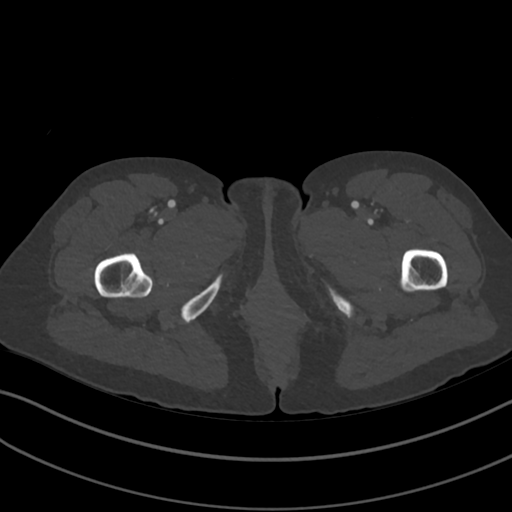
[im 56/316  soft-tissue]
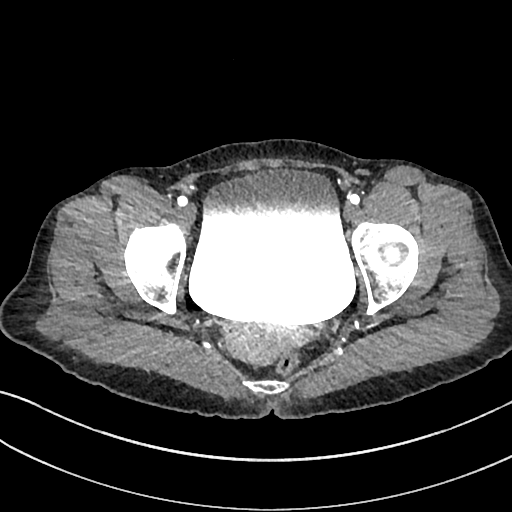
[im 75/316  soft-tissue]
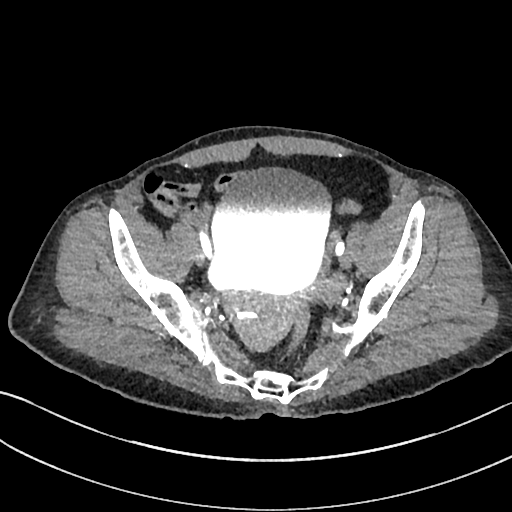
[im 112/316  soft-tissue]
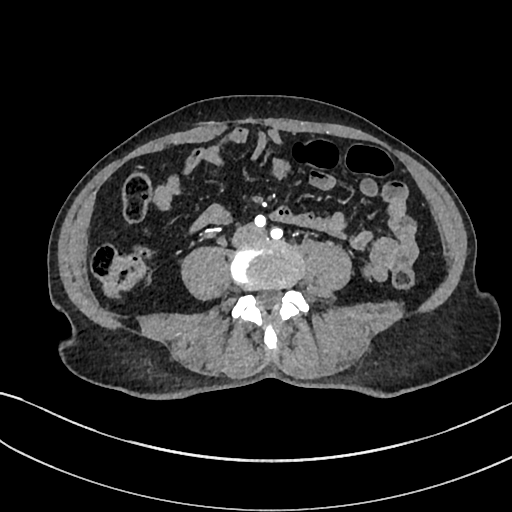
[im 130/316  soft-tissue]
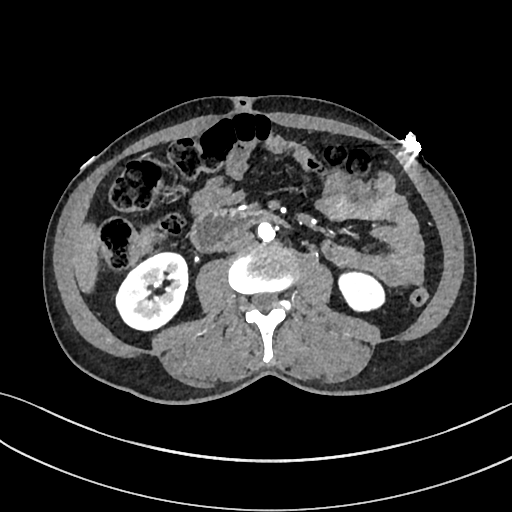
[im 167/316  soft-tissue]
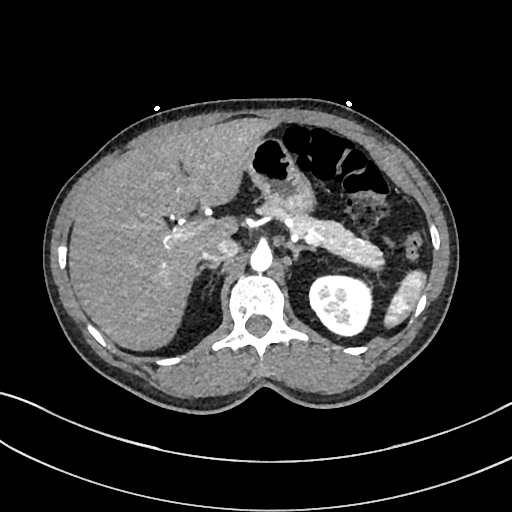
[im 186/316  soft-tissue]
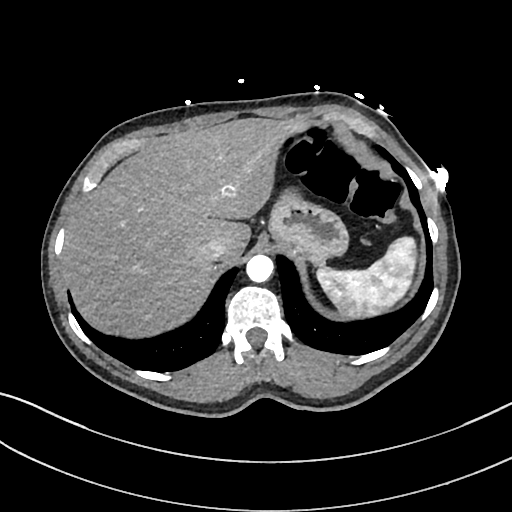
[im 204/316  soft-tissue]
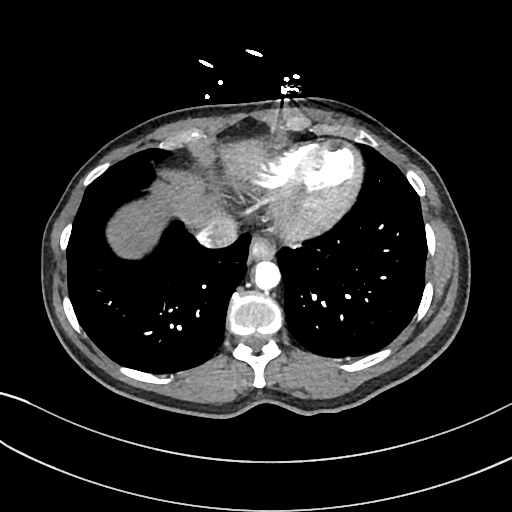
[im 241/316  soft-tissue]
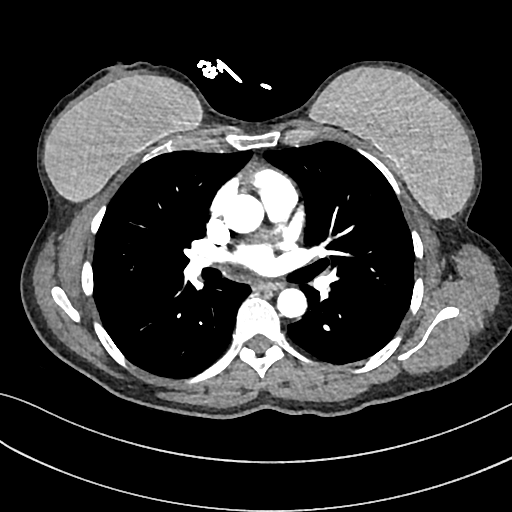
[im 241/316  bone]
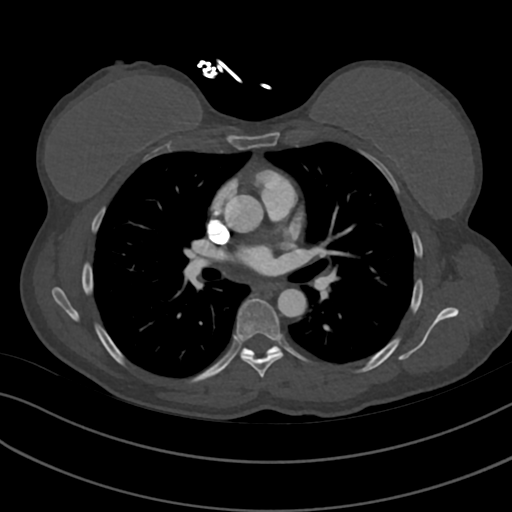
[im 260/316  soft-tissue]
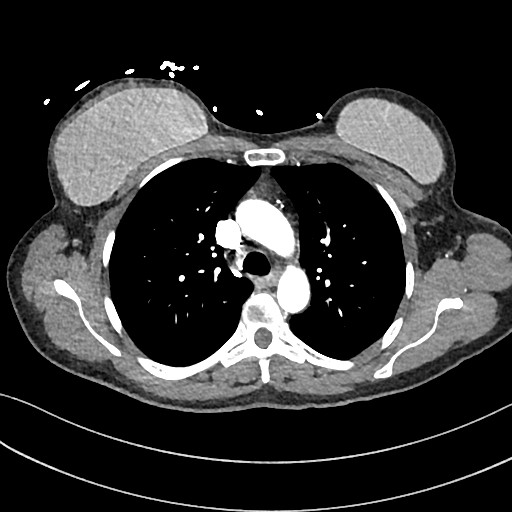
[im 297/316  soft-tissue]
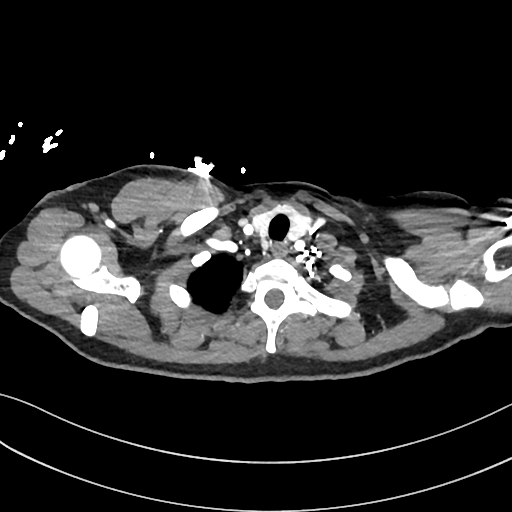

[Series 13: cor · coronal · 0.92mm/px · 3 of 151 slices shown]
[im 38/151  soft-tissue]
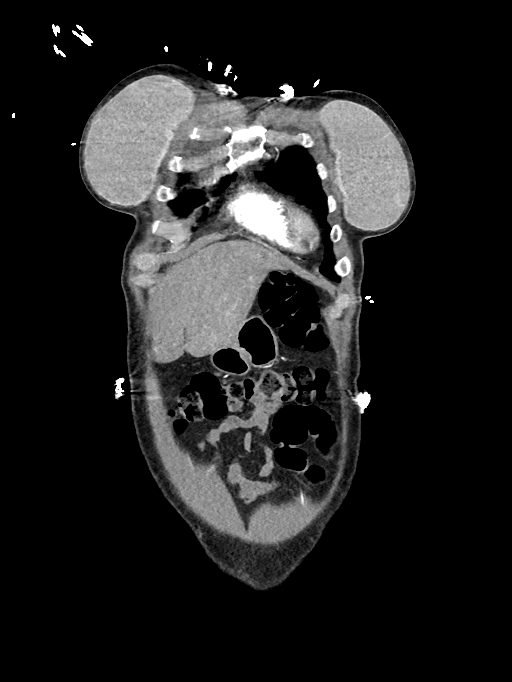
[im 76/151  soft-tissue]
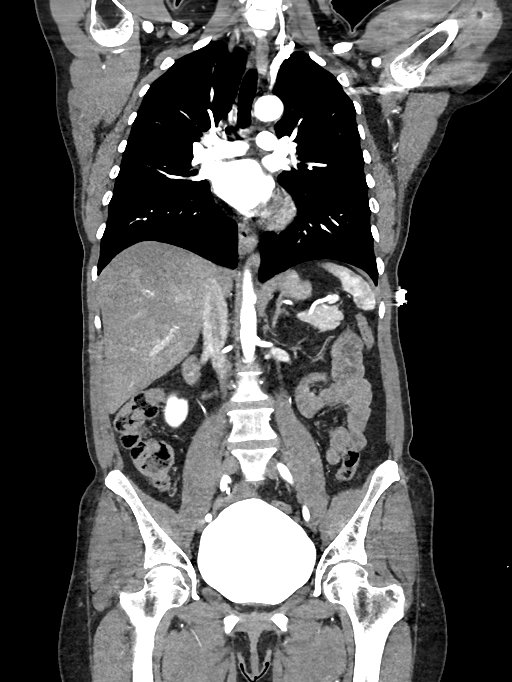
[im 113/151  soft-tissue]
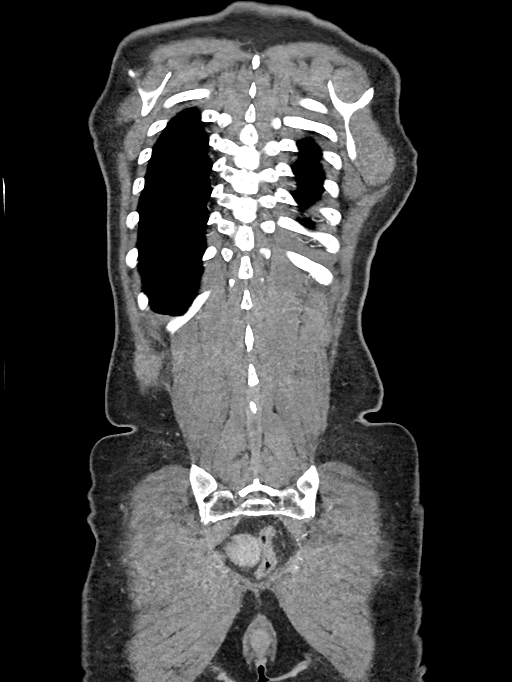

[14 of 46 positions shown; findings below may reference images not displayed]

Multidetector CT imaging through the chest, abdomen and pelvis was
performed using the standard protocol during bolus administration of
intravenous contrast. Multiplanar reconstructed images and MIPs were
obtained and reviewed to evaluate the vascular anatomy.

CONTRAST:  100mL OMNIPAQUE IOHEXOL 350 MG/ML SOLN
FINDINGS: CTA CHEST FINDINGS

Cardiovascular:

--Heart: The heart size is normal.  There is nopericardial effusion.

--Aorta: The course and caliber of the thoracic aorta are normal.
There is no aortic atherosclerotic calcification. There appears to
be some noncalcified plaque seen within the descending intrathoracic
aorta. Precontrast images show no aortic intramural hematoma. There
is no blood pool, dissection or penetrating ulcer demonstrated on
arterial phase postcontrast imaging. There is a conventional 3
vessel aortic arch branching pattern. The proximal arch vessels are
widely patent.

--Pulmonary Arteries: Contrast timing is optimized for preferential
opacification of the aorta. Within that limitation, normal central
pulmonary arteries.

Mediastinum/Nodes: No mediastinal, hilar or axillary
lymphadenopathy. The visualized thyroid and thoracic esophageal
course are unremarkable.

Lungs/Pleura: No pulmonary nodules or masses. No pleural effusion or
pneumothorax. No focal airspace consolidation. No focal pleural
abnormality.

Musculoskeletal: No chest wall abnormality. No acute osseous
findings.

Review of the MIP images confirms the above findings.

CTA ABDOMEN AND PELVIS FINDINGS

VASCULAR

Aorta: Normal caliber aorta without aneurysm, dissection, vasculitis
or hemodynamically significant stenosis. Scattered calcifications
are seen within the intra-abdominal descending aorta.

Celiac: No aneurysm, dissection or hemodynamically significant
stenosis. Normal branching pattern

SMA: Widely patent without dissection or stenosis.

Renals: Single renal arteries bilaterally. No aneurysm, dissection,
stenosis or evidence of fibromuscular dysplasia.

IMA: There is mild stenosis at the origin the IMA due to
atherosclerosis.

Inflow: Mild atherosclerosis seen in the bilateral common iliac
vasculature.

Veins: Normal course and caliber of the major veins. Assessment is
otherwise limited by the arterial dominant contrast phase.

Review of the MIP images confirms the above findings.

NON-VASCULAR

Hepatobiliary: Normal hepatic contours and density. No visible
biliary dilatation. Normal gallbladder.

Pancreas: Normal contours without ductal dilatation. No
peripancreatic fluid collection.

Spleen: Normal arterial phase splenic enhancement pattern.

Adrenals/Urinary Tract:

--Adrenal glands: Normal.

--Right kidney/ureter: No hydronephrosis or perinephric stranding.
No nephrolithiasis. No obstructing ureteral stones.

--Left kidney/ureter: No hydronephrosis or perinephric stranding. No
nephrolithiasis. No obstructing ureteral stones.

--Urinary bladder: Unremarkable.

Stomach/Bowel:

--Stomach/Duodenum: There is a small hiatal hernia present. Normal
duodenal course and caliber.

--Small bowel: No dilatation or inflammation.

--Colon: No focal abnormality.

Lymphatic:  No abdominal or pelvic lymphadenopathy.

Reproductive: No free fluid in the pelvis.

Musculoskeletal. No bony spinal canal stenosis or focal osseous
abnormality.

Other: None.

Review of the MIP images confirms the above findings.
IMPRESSION: 1. No acute aortic abnormality to explain the patient's symptoms.
2. Scattered mild to moderate calcified and noncalcified plaque in
the descending intrathoracic aorta and the infrarenal abdominal
aorta.
3. Mild stenosis at the origin of the IMA due to atherosclerosis.
4. No other acute intrathoracic, abdominal, or pelvic pathology.

## 2020-03-17 MED ORDER — IOHEXOL 350 MG/ML SOLN
100.0000 mL | Freq: Once | INTRAVENOUS | Status: AC | PRN
  Administered 2020-03-17: 100 mL via INTRAVENOUS

## 2020-03-17 MED ORDER — IOHEXOL 350 MG/ML SOLN
50.0000 mL | Freq: Once | INTRAVENOUS | Status: AC | PRN
  Administered 2020-03-17: 50 mL via INTRAVENOUS

## 2020-03-17 MED ORDER — ENOXAPARIN SODIUM 40 MG/0.4ML ~~LOC~~ SOLN
40.0000 mg | Freq: Every day | SUBCUTANEOUS | Status: DC
  Administered 2020-03-18: 40 mg via SUBCUTANEOUS
  Filled 2020-03-17: qty 0.4

## 2020-03-17 MED ORDER — SCOPOLAMINE 1 MG/3DAYS TD PT72
1.0000 | MEDICATED_PATCH | TRANSDERMAL | Status: DC | PRN
  Filled 2020-03-17: qty 1

## 2020-03-17 MED ORDER — ACETAMINOPHEN 500 MG PO TABS
1000.0000 mg | ORAL_TABLET | Freq: Once | ORAL | Status: AC
  Administered 2020-03-18: 1000 mg via ORAL
  Filled 2020-03-17: qty 2

## 2020-03-17 MED ORDER — POTASSIUM CHLORIDE CRYS ER 20 MEQ PO TBCR
40.0000 meq | EXTENDED_RELEASE_TABLET | Freq: Once | ORAL | Status: AC
  Administered 2020-03-17: 40 meq via ORAL
  Filled 2020-03-17: qty 2

## 2020-03-17 MED ORDER — SODIUM CHLORIDE 0.9 % IV BOLUS
500.0000 mL | Freq: Once | INTRAVENOUS | Status: AC
  Administered 2020-03-17: 500 mL via INTRAVENOUS

## 2020-03-17 MED ORDER — SODIUM CHLORIDE 0.9 % IV SOLN: INTRAVENOUS | Status: DC

## 2020-03-17 NOTE — ED Provider Notes (Signed)
[facility] EMERGENCY DEPARTMENT Provider Note   CSN: [id number] Arrival date & time: 03/17/20  1637     History Chief Complaint  Patient presents with  . Chest Pain/Vision problems    Sharon Friedman is a 49 y.o. female with past medical history of hypertension, migraines, who presents today for evaluation of headaches, hypertension, chest pain. She reports that 2 days ago when she woke up she noted that her blood pressure was elevated.  She normally checks it every morning and that normally runs in the 140 systolic however was in the 170s systolic.  She had a headache at that time.  She states that this feels different from her previous migraines.  She denies any head trauma or chiropractic adjustments proceeding her symptoms. She states that she has not missed any doses of her antihypertensives in the past few months.  She reports that today she feels like her head is "a balloon of air."  She states that at about 10 AM she started getting left-sided sharp chest pain.  It does not radiate or move.  It improved after she was given nitro, aspirin and nausea medicine from her PCP however still present.  She denies any shortness of breath.  No hormone use, recent surgeries or immobilizations, no hemoptysis.  She reports that she feels like she had blurry vision earlier.  She states that that has improved. She noted this morning that when she was in the shower she went to grab her towel with her left hand and the hand was weak, and she had to use her right hand.  She reports that she felt abnormal when walking into her PCP office, however didn't notice specific left sided weakness.  She is unable to pinpoint when her symptoms started.      HPI     Past Medical History:  Diagnosis Date  . Hypertension   . Migraines     Patient Active Problem List   Diagnosis Date Noted  . Occipital headache 06/11/2018  . left sided temporal headache 06/11/2018  . Occipital  neuralgia 06/11/2018  . Vitiligo 11/14/2016  . Essential hypertension 07/13/2014  . Migraine 07/13/2014    Past Surgical History:  Procedure Laterality Date  . APPENDECTOMY    . BREAST ENHANCEMENT SURGERY    . DILATION AND CURETTAGE OF UTERUS    . EYE SURGERY     lifted cornea (as child done for astigmatism)     OB History    Gravida  4   Para  3   Term  3   Preterm  0   AB  1   Living  3     SAB  1   TAB  0   Ectopic  0   Multiple  0   Live Births              Family History  Problem Relation Age of Onset  . Hypertension Father   . Hypertension Mother   . Colon cancer Maternal Grandfather   . Healthy Paternal Grandfather   . Lupus Sister 58       (diagnosed age 45)  . Kidney failure Sister   . Liver disease Sister   . Vitiligo Maternal Uncle     Social History   Tobacco Use  . Smoking status: Never Smoker  . Smokeless tobacco: Never Used  Substance Use Topics  . Alcohol use: Yes    Alcohol/week: 1.0 standard drink    Types: 1 Glasses  of wine per week  . Drug use: No    Home Medications Prior to Admission medications   Medication Sig Start Date End Date Taking? Authorizing Provider  Aspirin-Acetaminophen-Caffeine (EXCEDRIN EXTRA STRENGTH PO) Take 1 tablet by mouth daily as needed (migraine).    Yes [provider]  chlorthalidone (HYGROTON) 50 MG tablet Take 50 mg by mouth daily.   Yes [provider]  levonorgestrel (MIRENA, 52 MG,) 20 MCG/24HR IUD 1 each by Intrauterine route once.    Yes [provider]  metoprolol succinate (TOPROL-XL) 100 MG 24 hr tablet TAKE 1 TABLET BY MOUTH ONCE DAILY WITH MEALS Patient taking differently: Take 100 mg by mouth daily.  02/26/19  Yes Koberlein, Paris Lore, MD  chlorthalidone (HYGROTON) 25 MG tablet Take 1 tablet (25 mg total) by mouth 2 (two) times daily. Patient not taking: Reported on 03/17/2020 12/13/18   Wynn Banker, MD    Allergies    Black walnut pollen  allergy skin test, Amlodipine, Food, and Lisinopril  Review of Systems   Review of Systems  Constitutional: Negative for chills and fever.  Eyes: Positive for visual disturbance.  Respiratory: Positive for chest tightness and shortness of breath.   Cardiovascular: Positive for chest pain. Negative for palpitations.  Gastrointestinal: Positive for nausea. Negative for abdominal pain, constipation and vomiting.  Musculoskeletal: Negative for back pain and neck pain.  Neurological: Positive for weakness and headaches. Negative for speech difficulty.  All other systems reviewed and are negative.   Physical Exam Updated Vital Signs BP (!) 164/87   Pulse 64   Temp 98.4 F (36.9 C)   Resp 16   Ht  (1.549 m)   Wt 58.1 kg   LMP 02/28/2020 (Approximate)   SpO2 97%   BMI 24.19 kg/m   Physical Exam Vitals and nursing note reviewed.  Constitutional:      General: She is not in acute distress.    Appearance: She is well-developed.  HENT:     Head: Normocephalic and atraumatic.  Eyes:     Conjunctiva/sclera: Conjunctivae normal.  Cardiovascular:     Rate and Rhythm: Normal rate and regular rhythm.     Pulses: Normal pulses.     Heart sounds: Normal heart sounds. No murmur heard.   Pulmonary:     Effort: Pulmonary effort is normal. No respiratory distress.     Breath sounds: Normal breath sounds.  Abdominal:     Palpations: Abdomen is soft.     Tenderness: There is no abdominal tenderness.  Musculoskeletal:     Cervical back: Normal range of motion and neck supple. No rigidity.     Right lower leg: No edema.     Left lower leg: No edema.  Skin:    General: Skin is warm and dry.  Neurological:     Mental Status: She is alert.     Comments: Mental Status:  Alert, oriented, thought content appropriate, able to give a coherent history. Speech fluent without evidence of aphasia. Able to follow 2 step commands without difficulty.  Cranial Nerves:  II:  Peripheral visual  fields grossly normal, pupils equal, round, reactive to light III,IV, VI: ptosis not present, extra-ocular motions intact bilaterally  V,VII: smile symmetric, facial light touch sensation equal other than increased in left V1 distribution.  VIII: hearing grossly normal to voice  X: uvula elevates symmetrically  XI: bilateral shoulder shrug symmetric and strong XII: midline tongue extension without fassiculations Motor:  Normal tone. 5/5 in right upper  and lower extremities.  Left side with 4/5 strength in left arm, leg, with left sided pronator drift.  Cerebellar: normal finger-to-nose with bilateral upper extremities CV: distal pulses palpable throughout    Psychiatric:        Mood and Affect: Mood normal.        Behavior: Behavior normal.     ED Results / Procedures / Treatments   Labs (all labs ordered are listed, but only abnormal results are displayed) Labs Reviewed  PROTIME-INR - Abnormal; Notable for the following components:      Result Value   Prothrombin Time 15.5 (*)    INR 1.3 (*)    All other components within normal limits  URINALYSIS, ROUTINE W REFLEX MICROSCOPIC - Abnormal; Notable for the following components:   Specific Gravity, Urine >1.046 (*)    Nitrite POSITIVE (*)    Leukocytes,Ua LARGE (*)    Bacteria, UA RARE (*)    All other components within normal limits  COMPREHENSIVE METABOLIC PANEL - Abnormal; Notable for the following components:   Potassium 3.1 (*)    Chloride 95 (*)    Glucose, Bld 102 (*)    All other components within normal limits  I-STAT CHEM 8, ED - Abnormal; Notable for the following components:   Chloride 96 (*)    Glucose, Bld 100 (*)    All other components within normal limits  TROPONIN I (HIGH SENSITIVITY) - Abnormal; Notable for the following components:   Troponin I (High Sensitivity) 28 (*)    All other components within normal limits  TROPONIN I (HIGH SENSITIVITY) - Abnormal; Notable for the following components:    Troponin I (High Sensitivity) 29 (*)    All other components within normal limits  RESPIRATORY PANEL BY RT PCR (FLU A&B, COVID)  URINE CULTURE  CBC WITH DIFFERENTIAL/PLATELET  ETHANOL  APTT  RAPID URINE DRUG SCREEN, HOSP PERFORMED  LIPASE, BLOOD  MAGNESIUM  I-STAT BETA HCG BLOOD, ED ([facility], [facility], [facility] ONLY)    EKG EKG Interpretation  Date/Time:  Wednesday March 17 2020 16:54:57 EST Ventricular Rate:  64 PR Interval:    QRS Duration: 155 QT Interval:  463 QTC Calculation: 478 R Axis:   7 Text Interpretation: Sinus rhythm Left bundle branch block No old tracing to compare Confirmed by Susy Frizzle [phone]) on 03/17/2020 4:57:39 PM   Radiology CT Angio Head W/Cm &/Or Wo Cm  Result Date: 03/17/2020 CLINICAL DATA:  Neurological deficit.  Headache.  Blurred vision. EXAM: CT ANGIOGRAPHY HEAD AND NECK TECHNIQUE: Multidetector CT imaging of the head and neck was performed using the standard protocol during bolus administration of intravenous contrast. Multiplanar CT image reconstructions and MIPs were obtained to evaluate the vascular anatomy. Carotid stenosis measurements (when applicable) are obtained utilizing NASCET criteria, using the distal internal carotid diameter as the denominator. CONTRAST:  32mL OMNIPAQUE IOHEXOL 350 MG/ML SOLN COMPARISON:  None. FINDINGS: CT HEAD FINDINGS Brain: The brain shows a normal appearance without evidence of malformation, atrophy, old or acute small or large vessel infarction, mass lesion, hemorrhage, hydrocephalus or extra-axial collection. Vascular: No hyperdense vessel. No evidence of atherosclerotic calcification. Skull: Normal.  No traumatic finding.  No focal bone lesion. Sinuses/Orbits: Sinuses are clear. Orbits appear normal. Mastoids are clear. Other: None significant CTA NECK FINDINGS Aortic arch: Normal Right carotid system: Normal. No carotid bifurcation disease. Cervical ICA widely patent. Left carotid system: Common carotid artery widely patent  to the bifurcation. Mild atherosclerotic plaque at the ICA bulb but no stenosis. Cervical  ICA normal beyond that. Vertebral arteries: Vertebral artery origins are widely patent. Left subclavian artery shows some soft plaque proximal to the vertebral origin, but no stenosis greater than 20%. Both vertebral arteries appear normal through the cervical region to the foramen magnum. Skeleton: Normal Other neck: No lymphadenopathy.  Heterogeneous enlarged thyroid. Upper chest: Normal Review of the MIP images confirms the above findings CTA HEAD FINDINGS Anterior circulation: Both internal carotid arteries widely patent through the skull base and siphon regions. The anterior and middle cerebral vessels are patent without large or medium vessel occlusion, proximal stenosis, aneurysm or vascular malformation. Posterior circulation: Both vertebral arteries widely patent to the basilar. No basilar stenosis. Posterior circulation branch vessels are normal. Venous sinuses: Patent and normal. Anatomic variants: None significant. Review of the MIP images confirms the above findings IMPRESSION: 1. Normal head CT. 2. No large or medium vessel occlusion. 3. No significant carotid bifurcation disease. Mild atherosclerotic plaque at the left ICA bulb but no stenosis. 4. Left subclavian artery shows some soft plaque proximal to the left vertebral origin, but no stenosis greater than 20%. Both vertebral arteries appear normal through the cervical region to the basilar. 5. Heterogeneous enlarged thyroid. Recommend thyroid ultrasound (ref: J Am Coll Radiol. 2015 Feb;12(2): 143-50). Electronically Signed   By: Paulina FusiMark  Shogry M.D.   On: 03/17/2020 18:23   CT Head Wo Contrast  Result Date: 03/17/2020 CLINICAL DATA:  Neurological deficit.  Headache.  Blurred vision. EXAM: CT ANGIOGRAPHY HEAD AND NECK TECHNIQUE: Multidetector CT imaging of the head and neck was performed using the standard protocol during bolus administration of intravenous  contrast. Multiplanar CT image reconstructions and MIPs were obtained to evaluate the vascular anatomy. Carotid stenosis measurements (when applicable) are obtained utilizing NASCET criteria, using the distal internal carotid diameter as the denominator. CONTRAST:  50mL OMNIPAQUE IOHEXOL 350 MG/ML SOLN COMPARISON:  None. FINDINGS: CT HEAD FINDINGS Brain: The brain shows a normal appearance without evidence of malformation, atrophy, old or acute small or large vessel infarction, mass lesion, hemorrhage, hydrocephalus or extra-axial collection. Vascular: No hyperdense vessel. No evidence of atherosclerotic calcification. Skull: Normal.  No traumatic finding.  No focal bone lesion. Sinuses/Orbits: Sinuses are clear. Orbits appear normal. Mastoids are clear. Other: None significant CTA NECK FINDINGS Aortic arch: Normal Right carotid system: Normal. No carotid bifurcation disease. Cervical ICA widely patent. Left carotid system: Common carotid artery widely patent to the bifurcation. Mild atherosclerotic plaque at the ICA bulb but no stenosis. Cervical ICA normal beyond that. Vertebral arteries: Vertebral artery origins are widely patent. Left subclavian artery shows some soft plaque proximal to the vertebral origin, but no stenosis greater than 20%. Both vertebral arteries appear normal through the cervical region to the foramen magnum. Skeleton: Normal Other neck: No lymphadenopathy.  Heterogeneous enlarged thyroid. Upper chest: Normal Review of the MIP images confirms the above findings CTA HEAD FINDINGS Anterior circulation: Both internal carotid arteries widely patent through the skull base and siphon regions. The anterior and middle cerebral vessels are patent without large or medium vessel occlusion, proximal stenosis, aneurysm or vascular malformation. Posterior circulation: Both vertebral arteries widely patent to the basilar. No basilar stenosis. Posterior circulation branch vessels are normal. Venous sinuses:  Patent and normal. Anatomic variants: None significant. Review of the MIP images confirms the above findings IMPRESSION: 1. Normal head CT. 2. No large or medium vessel occlusion. 3. No significant carotid bifurcation disease. Mild atherosclerotic plaque at the left ICA bulb but no stenosis. 4. Left subclavian artery shows  some soft plaque proximal to the left vertebral origin, but no stenosis greater than 20%. Both vertebral arteries appear normal through the cervical region to the basilar. 5. Heterogeneous enlarged thyroid. Recommend thyroid ultrasound (ref: J Am Coll Radiol. 2015 Feb;12(2): 143-50). Electronically Signed   By: Paulina Fusi M.D.   On: 03/17/2020 18:23   CT Angio Neck W and/or Wo Contrast  Result Date: 03/17/2020 CLINICAL DATA:  Neurological deficit.  Headache.  Blurred vision. EXAM: CT ANGIOGRAPHY HEAD AND NECK TECHNIQUE: Multidetector CT imaging of the head and neck was performed using the standard protocol during bolus administration of intravenous contrast. Multiplanar CT image reconstructions and MIPs were obtained to evaluate the vascular anatomy. Carotid stenosis measurements (when applicable) are obtained utilizing NASCET criteria, using the distal internal carotid diameter as the denominator. CONTRAST:  14mL OMNIPAQUE IOHEXOL 350 MG/ML SOLN COMPARISON:  None. FINDINGS: CT HEAD FINDINGS Brain: The brain shows a normal appearance without evidence of malformation, atrophy, old or acute small or large vessel infarction, mass lesion, hemorrhage, hydrocephalus or extra-axial collection. Vascular: No hyperdense vessel. No evidence of atherosclerotic calcification. Skull: Normal.  No traumatic finding.  No focal bone lesion. Sinuses/Orbits: Sinuses are clear. Orbits appear normal. Mastoids are clear. Other: None significant CTA NECK FINDINGS Aortic arch: Normal Right carotid system: Normal. No carotid bifurcation disease. Cervical ICA widely patent. Left carotid system: Common carotid artery  widely patent to the bifurcation. Mild atherosclerotic plaque at the ICA bulb but no stenosis. Cervical ICA normal beyond that. Vertebral arteries: Vertebral artery origins are widely patent. Left subclavian artery shows some soft plaque proximal to the vertebral origin, but no stenosis greater than 20%. Both vertebral arteries appear normal through the cervical region to the foramen magnum. Skeleton: Normal Other neck: No lymphadenopathy.  Heterogeneous enlarged thyroid. Upper chest: Normal Review of the MIP images confirms the above findings CTA HEAD FINDINGS Anterior circulation: Both internal carotid arteries widely patent through the skull base and siphon regions. The anterior and middle cerebral vessels are patent without large or medium vessel occlusion, proximal stenosis, aneurysm or vascular malformation. Posterior circulation: Both vertebral arteries widely patent to the basilar. No basilar stenosis. Posterior circulation branch vessels are normal. Venous sinuses: Patent and normal. Anatomic variants: None significant. Review of the MIP images confirms the above findings IMPRESSION: 1. Normal head CT. 2. No large or medium vessel occlusion. 3. No significant carotid bifurcation disease. Mild atherosclerotic plaque at the left ICA bulb but no stenosis. 4. Left subclavian artery shows some soft plaque proximal to the left vertebral origin, but no stenosis greater than 20%. Both vertebral arteries appear normal through the cervical region to the basilar. 5. Heterogeneous enlarged thyroid. Recommend thyroid ultrasound (ref: J Am Coll Radiol. 2015 Feb;12(2): 143-50). Electronically Signed   By: Paulina Fusi M.D.   On: 03/17/2020 18:23   DG Chest Port 1 View  Result Date: 03/17/2020 CLINICAL DATA:  Chest pain EXAM: PORTABLE CHEST 1 VIEW COMPARISON:  04/15/2008 FINDINGS: The heart size and mediastinal contours are within normal limits. Both lungs are clear. The visualized skeletal structures are  unremarkable. IMPRESSION: Normal study. Electronically Signed   By: Charlett Nose M.D.   On: 03/17/2020 18:07   CT Angio Chest/Abd/Pel for Dissection W and/or W/WO  Result Date: 03/17/2020 CLINICAL DATA:  Acute chest pain EXAM: CT ANGIOGRAPHY CHEST, ABDOMEN AND PELVIS TECHNIQUE: Non-contrast CT of the chest was initially obtained. Multidetector CT imaging through the chest, abdomen and pelvis was performed using the standard protocol during bolus  administration of intravenous contrast. Multiplanar reconstructed images and MIPs were obtained and reviewed to evaluate the vascular anatomy. CONTRAST:  OMNIPAQUE IOHEXOL 350 MG/ML SOLN COMPARISON:  None. FINDINGS: CTA CHEST FINDINGS Cardiovascular: --Heart: The heart size is normal.  There is nopericardial effusion. --Aorta: The course and caliber of the thoracic aorta are normal. There is no aortic atherosclerotic calcification. There appears to be some noncalcified plaque seen within the descending intrathoracic aorta. Precontrast images show no aortic intramural hematoma. There is no blood pool, dissection or penetrating ulcer demonstrated on arterial phase postcontrast imaging. There is a conventional 3 vessel aortic arch branching pattern. The proximal arch vessels are widely patent. --Pulmonary Arteries: Contrast timing is optimized for preferential opacification of the aorta. Within that limitation, normal central pulmonary arteries. Mediastinum/Nodes: No mediastinal, hilar or axillary lymphadenopathy. The visualized thyroid and thoracic esophageal course are unremarkable. Lungs/Pleura: No pulmonary nodules or masses. No pleural effusion or pneumothorax. No focal airspace consolidation. No focal pleural abnormality. Musculoskeletal: No chest wall abnormality. No acute osseous findings. Review of the MIP images confirms the above findings. CTA ABDOMEN AND PELVIS FINDINGS VASCULAR Aorta: Normal caliber aorta without aneurysm, dissection, vasculitis or  hemodynamically significant stenosis. Scattered calcifications are seen within the intra-abdominal descending aorta. Celiac: No aneurysm, dissection or hemodynamically significant stenosis. Normal branching pattern SMA: Widely patent without dissection or stenosis. Renals: Single renal arteries bilaterally. No aneurysm, dissection, stenosis or evidence of fibromuscular dysplasia. IMA: There is mild stenosis at the origin the IMA due to atherosclerosis. Inflow: Mild atherosclerosis seen in the bilateral common iliac vasculature. Veins: Normal course and caliber of the major veins. Assessment is otherwise limited by the arterial dominant contrast phase. Review of the MIP images confirms the above findings. NON-VASCULAR Hepatobiliary: Normal hepatic contours and density. No visible biliary dilatation. Normal gallbladder. Pancreas: Normal contours without ductal dilatation. No peripancreatic fluid collection. Spleen: Normal arterial phase splenic enhancement pattern. Adrenals/Urinary Tract: --Adrenal glands: Normal. --Right kidney/ureter: No hydronephrosis or perinephric stranding. No nephrolithiasis. No obstructing ureteral stones. --Left kidney/ureter: No hydronephrosis or perinephric stranding. No nephrolithiasis. No obstructing ureteral stones. --Urinary bladder: Unremarkable. Stomach/Bowel: --Stomach/Duodenum: There is a small hiatal hernia present. Normal duodenal course and caliber. --Small bowel: No dilatation or inflammation. --Colon: No focal abnormality. Lymphatic:  No abdominal or pelvic lymphadenopathy. Reproductive: No free fluid in the pelvis. Musculoskeletal. No bony spinal canal stenosis or focal osseous abnormality. Other: None. Review of the MIP images confirms the above findings. IMPRESSION: 1. No acute aortic abnormality to explain the patient's symptoms. 2. Scattered mild to moderate calcified and noncalcified plaque in the descending intrathoracic aorta and the infrarenal abdominal aorta. 3. Mild  stenosis at the origin of the IMA due to atherosclerosis. 4. No other acute intrathoracic, abdominal, or pelvic pathology. Electronically Signed   By: Jonna Clark M.D.   On: 03/17/2020 20:20    Procedures .Critical Care Performed by: Cristina Gong, PA-C Authorized by: Cristina Gong, PA-C   Critical care provider statement:    Critical care time (minutes):  45   Critical care time was exclusive of:  Separately billable procedures and treating other patients and teaching time   Critical care was time spent personally by me on the following activities:  Discussions with consultants, evaluation of patient's response to treatment, examination of patient, ordering and performing treatments and interventions, ordering and review of laboratory studies, ordering and review of radiographic studies, pulse oximetry, re-evaluation of patient's condition, obtaining history from patient or surrogate and review of old charts  Comments:     Chest pain, positive troponin, CT scan to look for other causes.    (including critical care time)  Medications Ordered in ED Medications  iohexol (OMNIPAQUE) 350 MG/ML injection 50 mL (50 mLs Intravenous Contrast Given 03/17/20 1809)  iohexol (OMNIPAQUE) 350 MG/ML injection 100 mL (100 mLs Intravenous Contrast Given 03/17/20 2007)  sodium chloride 0.9 % bolus 500 mL (0 mLs Intravenous Stopped 03/17/20 2218)  potassium chloride SA (KLOR-CON) CR tablet 40 mEq (40 mEq Oral Given 03/17/20 2222)    ED Course  I have reviewed the triage vital signs and the nursing notes.  Pertinent labs & imaging results that were available during my care of the patient were reviewed by me and considered in my medical decision making (see chart for details).    MDM Rules/Calculators/A&P                         Sharon Friedman is a 49 year old woman who presents today for evaluation of left sided chest pain, left sided weakness, LBBB, and headache.    Given combination  of headache and chest pain with neurologic symptoms concern for dissection.  Patient has LBBB, no prior for comparison to determine if this is acute or chronic.  CTA of Head, neck, chest, A/P obtained with out evidence of occlusion, dissection or aneurysm.    CBC is reassuring, INR is slightly elevated, no blood thinner use.  Pregnancy test is negative, ethanol undetected. UDS and Covid/flu testing negative.  CMP shows hypokalemia, Magnesium is normal.  UA shows dehydration with elevated specific gravity, Nitrite positive with large leuks, however microscopy shows rare bacteria, and patient is not symptomatic.  For now plan to culture.    Troponin 28, 29.    Patient felt like her HTN was causing her symptoms, however her pressures have been relatively low with systolics in the 160s-170s so I doubt hypertensive emergency causing symptoms.    Given no dissection Neurology is consulted regarding imaging.  I spoke with Dr. Wilford Corner who recommended MRI brain with and without.  This is ordered.  Will allow permissive HTN until stroke is ruled out.    Patient doesn't have a clear onset for her weakness therefore outside any intervention window for TPA and doesn't meet LVO criteria.    I spoke with IMTS who will see the patient for admission.    Note: Portions of this report may have been transcribed using voice recognition software. Every effort was made to ensure accuracy; however, inadvertent computerized transcription errors may be present   Final Clinical Impression(s) / ED Diagnoses Final diagnoses:  Left-sided weakness  Chest pain, unspecified type  Elevated troponin  Hypokalemia  Acute nonintractable headache, unspecified headache type  Hypertension, unspecified type    Rx / DC Orders ED Discharge Orders    None       Norman Clay 03/17/20 2324    Pollyann Savoy, MD 03/18/20 2021

## 2020-03-17 NOTE — ED Triage Notes (Signed)
Pt arrived via [facility] from PCP after pt started having chest pain with left sided vision problems and some left sided weakness starting around noon today. Pt received 324 aspirin and 1 nitro.

## 2020-03-17 NOTE — ED Notes (Signed)
Patient transported to CT 

## 2020-03-17 NOTE — H&P (Addendum)
Family Medicine Teaching [facility] Admission History and Physical Service Pager: [phone]  Patient name: Sharon Friedman Medical record number: [id number] Date of birth: [birth year] Age: 49 y.o. Gender: female  Primary Care Provider: Wynn Banker, MD Consultants: Neuro  Code Status: Full  Preferred Emergency Contact: Layla Maw, [phone]  Chief Complaint: Headache, Elevated blood pressure  Assessment and Plan: Sharon Friedman is a 49 y.o. female presenting with headache and hypertension. PMH is significant for HTN, migraines, and vitiligo.   Headache  Left sided weakness: Acute. Patient presents with few day history of generalized headache, worse behind L eye. Also found to have L sided weakness on physical exam and pronator drift. In the ED patient is hypertensive with systolic BP between 140-179, but all other vitals wnl. CTA head, neck, chest, abdomen, and pelvis were essentially unremarkable. Differential includes complex migraine, CVA, TIA, hypertensive emergency, and dissection (ruled out with normal CTA).  Highest suspicion for atypical migraine vs CVA given objective left-sided weakness and reported aphasia (not evident on exam), in the setting of known history of migraine with aura.  However, otherwise at low CV risk with RF being hypertension only.  No hemorrhage or large/moderate vessel occlusion on CT head/neck.  Reassured by negative CT dissection study without any evidence of aortic dissection or aneurysm.  While her elevated pressure may be a contributor to her headache, no concern for hypertensive emergency at this time given pressures in the ED.  No temporal tenderness or jaw pain to suggest arteritis etiology.  -Admit to med-tele, attending Dr. Deirdre Priest -OOB with assistance -Vitals per routine -Neuro checks q8h -MRI brain pending, will discuss with neurology afterwards for formal consultation -Tylenol 1000mg  for headache -Scopolamine patch PRN for nausea  associated with headache, will avoid QTC altering antiemetics at this time -IV NS at 100 ml/hour x12 hrs given improvement of headache with bolus earlier and evidence of dehydration via U/a -PT/OT eval and treat   Left Bundle Branch Block  Chest Pain  Elevated Troponin Patient reports an episode of chest pain while at work today. Both her CP and nausea have resolved currently (intermittently having some nausea w/ HA). EKG at her PCP office was "abnormal", which is reportedly why she was sent to the ED. EKG in the ED demonstrates NSR at 66bpm with left bundle branch block. Sgarbossa criteria not concerning for MI. Troponin mildly elevated at 28>29>29 but have trended flat.  UDS negative.  No previous EKG for comparison, unclear if this is acute vs chronic.  Cardiac RF including hypertension only.  Aortic dissection ruled out as above.  Unclear etiology and atypical in nature, could also consider related to anxiety with increased work/home stressors and unrelenting headache, GERD, vs viral illness.  Question if mild troponin elevation may related to possible CVA as above.  Less likely pulmonary etiology, no infiltrate on CXR/CT and Covid negative. -Repeat EKG tonight and PRN  -Will have low threshold to discuss with cardiology if recurrent pain/change in presentation  Hypertension Patient with known hx of HTN, on chlorthalidone 50mg  and metoprolol 100mg  daily at home. BP in the ED has been elevated to 140-179 systolic.  Will avoid hypertensive control at this time until CVA effectively ruled out. -Monitor BP -Likely restart home meds tomorrow am pending MRI results/neuro recs  Hypokalemia Patient's K 3.1 on arrival. She was given PO potassium in the ED. -am BMP  Abnormal Urinalysis Patient's UA showed large leuks and positive nitrites. Denies urinary symptoms. -Monitor for UTI symptoms -  Will not treat as it seems to be asymptomatic bacteruria  Enlarged thyroid: Incidental finding of  heterogeneous enlarged thyroid on CT dissection study.  No known history of thyroid disorder. -Consider thyroid ultrasound outpatient  FEN/GI: NPO Prophylaxis: Lovenox  Disposition: med-tele  History of Present Illness:  Sharon Friedman is a 49 y.o. female presenting with elevated blood pressure, headache, and L sided weakness.  Reports on Monday she started having headaches, noted her blood pressure was higher than normal (going from 140 to 160). Did note she had a very stressful day on Monday.  Takes chlorthalidone and metoprolol for her blood pressure.  States she usually manages her BP around 140 with the addition of daily exercise, however for the past several weeks she has not been able to exercise due to a left shoulder injury.  Yesterday, her headache woke her up. Felt nauseous and lightheaded. Systolic 195 this morning. Headache still present, "a lot of pressure," seems to be everywhere but worse behind her left eye.   Starting having some chest pain during work today, sharp. Left arm started hurting and feeling numb. Still having finger tingling on the left. Couldn't find her words earlier today (daughter told her she was repeating her words), but now is having less trouble finding her words.  Chest pain has since resolved.  Went to her PCP at 2:30 this afternoon, did ekg and was told it was abnormal and sent to ED. She is having some weakness on the left side but she hadn't noticed initially until the ED provider told her this. Also reports ringing in her left ear intermittently (through chart review does have a history of intermittent lightheadedness and ringing with position changes).  She denies any previous history of MI or CVA, no family history of early CAD/MI.  Reports she has been very stressed overall with a hard week at work, states she is also moving and recently went through divorce this year.  This is not similar to her previous migraines. Has been several months since her  last, usually has aura light in her eyes (did not have any aura with these headaches).   ED course: On arrival she was hemodynamically stable and in no acute distress.  Due to neurologic and vascular concerns, a CTA dissection study was performed which did not show any dissection, aneurysm, or central PE.  Additionally had CTA head/neck with CT head without contrast that did not show any hemorrhage, area of ischemia, or large/moderate vessel occlusion.  ED provider discussed with neurology who recommended obtaining a brain MRI.  HS troponin 28>29.  EKG left bundle branch block with no previous EKG.  Given IV fluid bolus with improvement in headache.  Review Of Systems: Per HPI with the following additions:   Review of Systems  Constitutional: Negative for chills and fever.  HENT: Negative for rhinorrhea and sore throat.   Respiratory: Negative for cough and shortness of breath.   Cardiovascular: Positive for chest pain.  Gastrointestinal: Positive for nausea. Negative for abdominal pain, diarrhea and vomiting.  Neurological: Positive for weakness and headaches. Negative for syncope.     Patient Active Problem List   Diagnosis Date Noted  . Acute left-sided weakness 03/17/2020  . Occipital headache 06/11/2018  . left sided temporal headache 06/11/2018  . Occipital neuralgia 06/11/2018  . Vitiligo 11/14/2016  . Essential hypertension 07/13/2014  . Migraine 07/13/2014    Past Medical History: Past Medical History:  Diagnosis Date  . Hypertension   . Migraines  Past Surgical History: Past Surgical History:  Procedure Laterality Date  . APPENDECTOMY    . BREAST ENHANCEMENT SURGERY    . DILATION AND CURETTAGE OF UTERUS    . EYE SURGERY     lifted cornea (as child done for astigmatism)    Social History: Social History   Tobacco Use  . Smoking status: Never Smoker  . Smokeless tobacco: Never Used  Substance Use Topics  . Alcohol use: Yes    Alcohol/week: 1.0 standard  drink    Types: 1 Glasses of wine per week  . Drug use: No    Family History: Family History  Problem Relation Age of Onset  . Hypertension Father   . Hypertension Mother   . Colon cancer Maternal Grandfather   . Healthy Paternal Grandfather   . Lupus Sister [id number]       (diagnosed age 50)  . Kidney failure Sister   . Liver disease Sister   . Vitiligo Maternal Uncle     Allergies and Medications: Allergies  Allergen Reactions  . Black Walnut Pollen Allergy Skin Test     Other reaction(s): Angioedema  . Amlodipine     Dizziness    . Food Swelling    Walnut and hazelnut cause tongue "to break out in cuts" and tongue swells  . Lisinopril Itching and Swelling    Face gets red, swells up, and itches   No current facility-administered medications on file prior to encounter.   Current Outpatient Medications on File Prior to Encounter  Medication Sig Dispense Refill  . Aspirin-Acetaminophen-Caffeine (EXCEDRIN EXTRA STRENGTH PO) Take 1 tablet by mouth daily as needed (migraine).     . chlorthalidone (HYGROTON) 50 MG tablet Take 50 mg by mouth daily.    Marland Kitchen. levonorgestrel (MIRENA, 52 MG,) 20 MCG/24HR IUD 1 each by Intrauterine route once.     . metoprolol succinate (TOPROL-XL) 100 MG 24 hr tablet TAKE 1 TABLET BY MOUTH ONCE DAILY WITH MEALS (Patient taking differently: Take 100 mg by mouth daily. ) 30 tablet 0  . chlorthalidone (HYGROTON) 25 MG tablet Take 1 tablet (25 mg total) by mouth 2 (two) times daily. (Patient not taking: Reported on 03/17/2020) 180 tablet 0    Objective: BP (!) 179/87   Pulse (!) 59   Temp 98.4 F (36.9 C)   Resp 12   Ht 5\' 1"  (1.549 m)   Wt 58.1 kg   LMP 02/28/2020 (Approximate)   SpO2 100%   BMI 24.19 kg/m  Exam: General: alert, well-appearing, NAD Eyes: PERRL, EOMI ENTM: moist mucous membranes Head/neck: supple, no tenderness to palpation along temporal aspect bilaterally Cardiovascular: RRR, normal S1/S2 without m/r/g, radial pulses equal  bilaterally, nontender with chest palpation Respiratory: normal WOB, lungs CTAB Gastrointestinal: soft, nontender, nondistended Ext: no peripheral edema, warm, dry Derm: Coalesced macular regions of hypopigmentation present around mouth, eyes, and intermittently along bilateral extremities Neuro: Alert and oriented x3. CN II, III, IV, V intact, CN VII, IX, X, XI and XII intact. Patient reports increased sensitivity in V1 with light touch. Grade 4/5 strength in L upper and lower extremity as compared to R. Positive pronator drift on L, intact finger to nose and heel to shin (however slowed on L in comparison to right). Rapid hand motion abnormal on the left with slowed motion (and decreased strength ability to pronate arm).  Psych: appropriate affect, normal speech  Labs and Imaging: CBC BMET  Recent Labs  Lab 03/17/20 1713 03/17/20 1713 03/17/20 1729  WBC 9.0  --   --  HGB 13.6   < > 13.6  HCT 39.4   < > 40.0  PLT 250  --   --    < > = values in this interval not displayed.   Recent Labs  Lab 03/17/20 2111  NA 136  K 3.1*  CL 95*  CO2 30  BUN 9  CREATININE 0.76  GLUCOSE 102*  CALCIUM 9.6     EKG: NSR at 66bpm, normal axis, LBBB  CT Head wo contrast, CTA Head and Neck Result Date: 03/17/2020 IMPRESSION: 1. Normal head CT. 2. No large or medium vessel occlusion. 3. No significant carotid bifurcation disease. Mild atherosclerotic plaque at the left ICA bulb but no stenosis. 4. Left subclavian artery shows some soft plaque proximal to the left vertebral origin, but no stenosis greater than 20%. Both vertebral arteries appear normal through the cervical region to the basilar. 5. Heterogeneous enlarged thyroid. Recommend thyroid ultrasound  CT Angio Chest/Abd/Pel Result Date: 03/17/2020 IMPRESSION: 1. No acute aortic abnormality to explain the patient's symptoms. 2. Scattered mild to moderate calcified and noncalcified plaque in the descending intrathoracic aorta and the  infrarenal abdominal aorta. 3. 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 4. No other acute intrathoracic, abdominal, or pelvic pathology.   DG Chest Port 1 View Result Date: 03/17/2020 IMPRESSION: Normal study.  Maury Dus, MD PGY-1, [facility] 03/17/2020 11:56 PM   FPTS Upper-Level Resident Addendum   I have independently interviewed and examined the patient. I have discussed the above with the original author and agree with their documentation. My edits for correction/addition/clarification are added. Please see also any attending notes.    Allayne Stack, DO  Family Medicine PGY-3

## 2020-03-17 NOTE — ED Notes (Signed)
Lab to add on magnesium  

## 2020-03-18 ENCOUNTER — Observation Stay (HOSPITAL_COMMUNITY): Payer: No Typology Code available for payment source

## 2020-03-18 ENCOUNTER — Encounter (HOSPITAL_COMMUNITY): Payer: Self-pay | Admitting: Family Medicine

## 2020-03-18 ENCOUNTER — Other Ambulatory Visit: Payer: Self-pay

## 2020-03-18 DIAGNOSIS — I10 Essential (primary) hypertension: Secondary | ICD-10-CM | POA: Diagnosis not present

## 2020-03-18 DIAGNOSIS — R531 Weakness: Secondary | ICD-10-CM | POA: Diagnosis not present

## 2020-03-18 DIAGNOSIS — I447 Left bundle-branch block, unspecified: Secondary | ICD-10-CM

## 2020-03-18 DIAGNOSIS — R778 Other specified abnormalities of plasma proteins: Secondary | ICD-10-CM | POA: Diagnosis not present

## 2020-03-18 LAB — BASIC METABOLIC PANEL
Anion gap: 10 (ref 5–15)
BUN: 9 mg/dL (ref 6–20)
CO2: 26 mmol/L (ref 22–32)
Calcium: 9.1 mg/dL (ref 8.9–10.3)
Chloride: 101 mmol/L (ref 98–111)
Creatinine, Ser: 0.72 mg/dL (ref 0.44–1.00)
GFR, Estimated: >60 mL/min (ref >60)
Glucose, Bld: 163 mg/dL — ABNORMAL HIGH (ref 70–99)
Potassium: 3.3 mmol/L — ABNORMAL LOW (ref 3.5–5.1)
Sodium: 137 mmol/L (ref 135–145)

## 2020-03-18 LAB — LIPID PANEL
Cholesterol: 180 mg/dL (ref 0–200)
HDL: 30 mg/dL — ABNORMAL LOW (ref >40)
LDL Cholesterol: 113 mg/dL — ABNORMAL HIGH (ref 0–99)
Total CHOL/HDL Ratio: 6 RATIO
Triglycerides: 187 mg/dL — ABNORMAL HIGH (ref <150)
VLDL: 37 mg/dL (ref 0–40)

## 2020-03-18 LAB — HIV ANTIBODY (ROUTINE TESTING W REFLEX): HIV Screen 4th Generation wRfx: NONREACTIVE

## 2020-03-18 LAB — SEDIMENTATION RATE: Sed Rate: 14 mm/hr (ref 0–22)

## 2020-03-18 LAB — TROPONIN I (HIGH SENSITIVITY): Troponin I (High Sensitivity): 29 ng/L — ABNORMAL HIGH (ref <18)

## 2020-03-18 LAB — HEMOGLOBIN A1C
Hgb A1c MFr Bld: 5.6 % (ref 4.8–5.6)
Mean Plasma Glucose: 114.02 mg/dL

## 2020-03-18 IMAGING — MR MR HEAD WO/W CM
14 of 16 series · 40 of 48 positions shown · IV contrast (gadavist)
Comparison: Prior CTs from [DATE].

CLINICAL DATA: Initial evaluation for acute neuro deficit, stroke
suspected.

EXAM:
MRI HEAD WITHOUT AND WITH CONTRAST
TECHNIQUE: Multiplanar, multiecho pulse sequences of the brain and surrounding
structures were obtained without and with intravenous contrast.
CONTRAST:  5.5mL GADAVIST GADOBUTROL 1 MMOL/ML IV SOLN

[Series 5: DWI · axial · 3.0mm · 0.88mm/px · z∈[-115,+35]mm · 6 of 104 slices shown (1 of 4)]
[im 1/104]
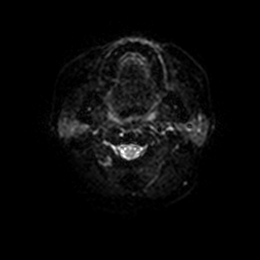
[im 21/104]
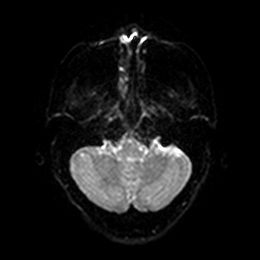
[im 42/104]
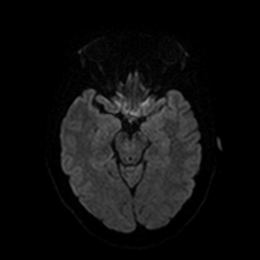
[im 62/104]
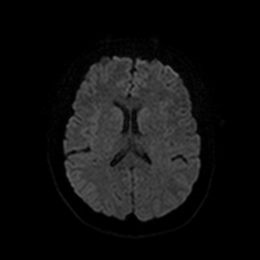
[im 83/104]
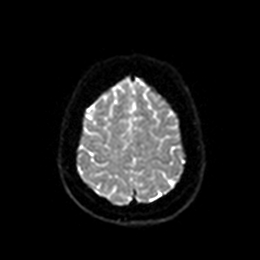
[im 104/104]
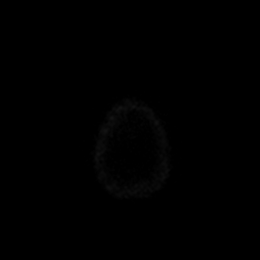

[Series 6: DWI · axial · 3.0mm · 0.88mm/px · z∈[-115,+35]mm · 2 of 52 slices shown (2 of 4)]
[im 1/52]
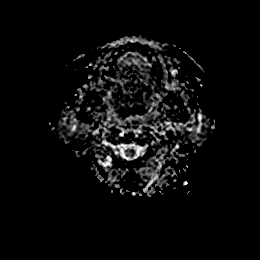
[im 52/52]
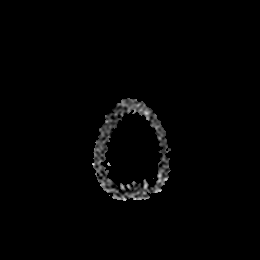

[Series 7: DWI · coronal · 4.0mm · 0.88mm/px · 4 of 72 slices shown (3 of 4)]
[im 1/72]
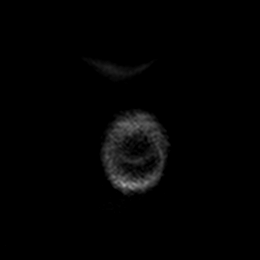
[im 24/72]
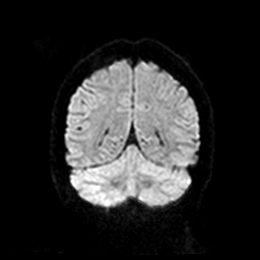
[im 48/72]
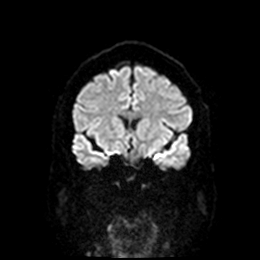
[im 72/72]
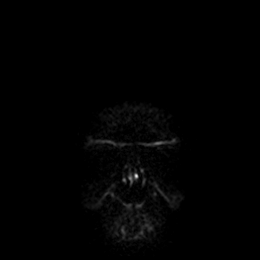

[Series 8: DWI · coronal · 4.0mm · 0.88mm/px · 1 of 36 slices shown (4 of 4)]
[im 1/36]
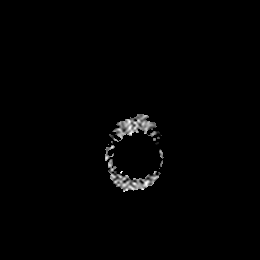

[Series 9: T1 · sagittal · 5.0mm · 0.75mm/px · 2 of 25 slices shown]
[im 1/25]
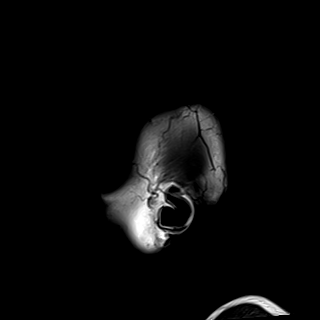
[im 25/25]
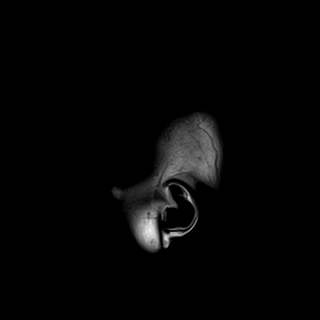

[Series 10: T2 · axial · 5.0mm · 0.72mm/px · z∈[-116,+36]mm · 2 of 27 slices shown]
[im 1/27]
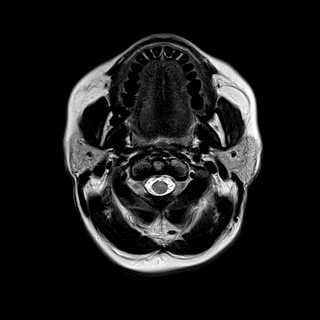
[im 27/27]
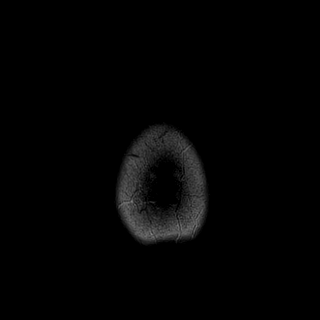

[Series 11: FLAIR · axial · 5.0mm · 0.45mm/px · z∈[-114,+39]mm · 2 of 27 slices shown]
[im 1/27]
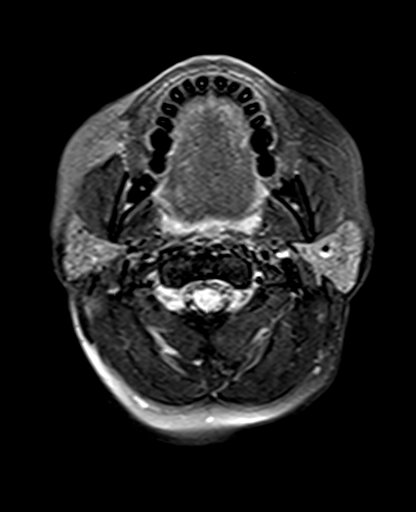
[im 27/27]
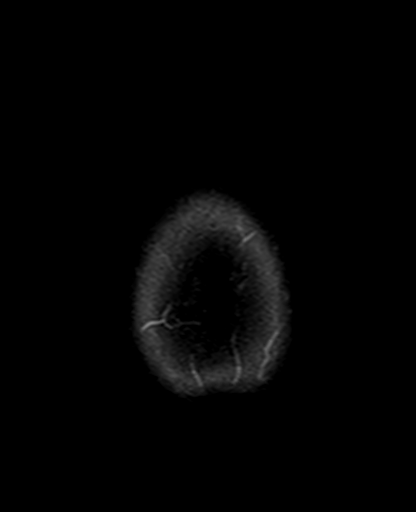

[Series 12: mag_images · axial · 3.0mm · 0.90mm/px · z∈[-118,+43]mm · 4 of 56 slices shown]
[im 1/56]
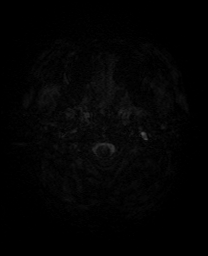
[im 19/56]
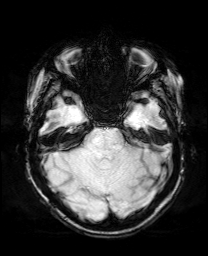
[im 37/56]
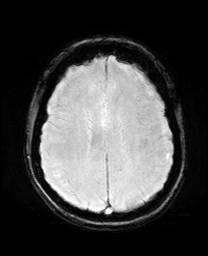
[im 56/56]
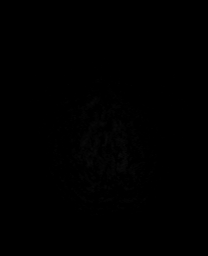

[Series 13: pha_images · axial · 3.0mm · 0.90mm/px · z∈[-118,+43]mm · 4 of 56 slices shown]
[im 1/56]
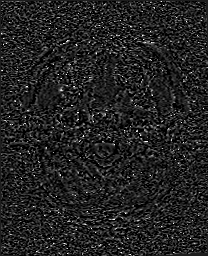
[im 19/56]
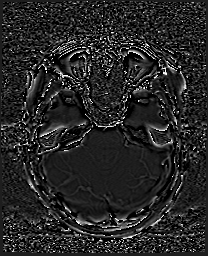
[im 37/56]
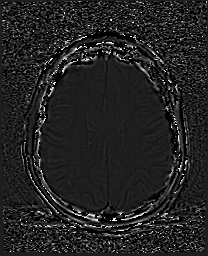
[im 56/56]
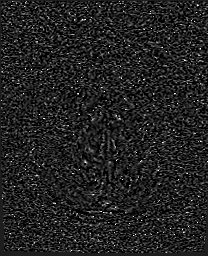

[Series 14: swi_images · axial · 3.0mm · 0.90mm/px · z∈[-118,+43]mm · 4 of 56 slices shown]
[im 1/56]
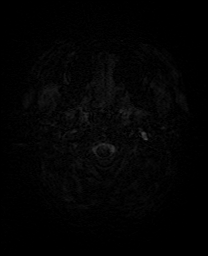
[im 19/56]
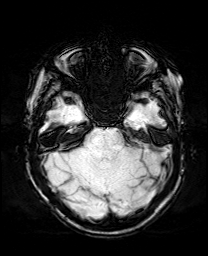
[im 37/56]
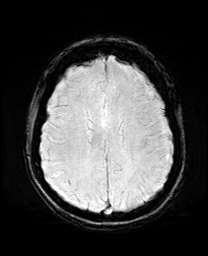
[im 56/56]
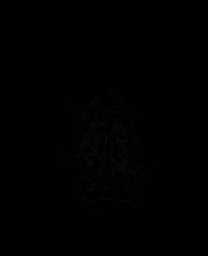

[Series 15: mip_images(sw) · axial · 24.0mm · 0.90mm/px · z∈[-108,+33]mm · 3 of 49 slices shown]
[im 1/49]
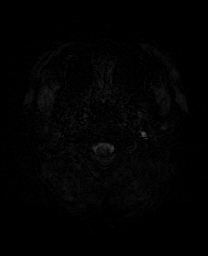
[im 25/49]
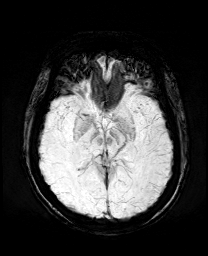
[im 49/49]
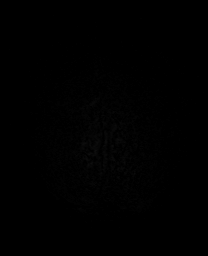

[Series 17: T2 post-contrast · coronal · 5.0mm · 0.72mm/px · 2 of 28 slices shown]
[im 1/28]
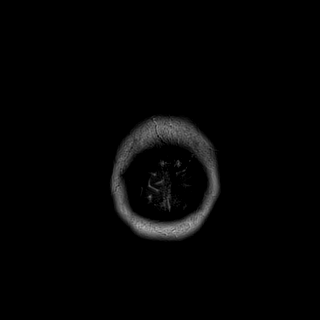
[im 28/28]
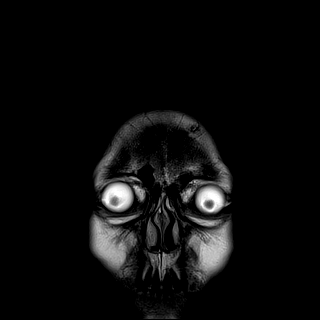

[Series 19: T1 post-contrast · coronal · 5.0mm · 0.34mm/px · 2 of 28 slices shown (1 of 2)]
[im 1/28]
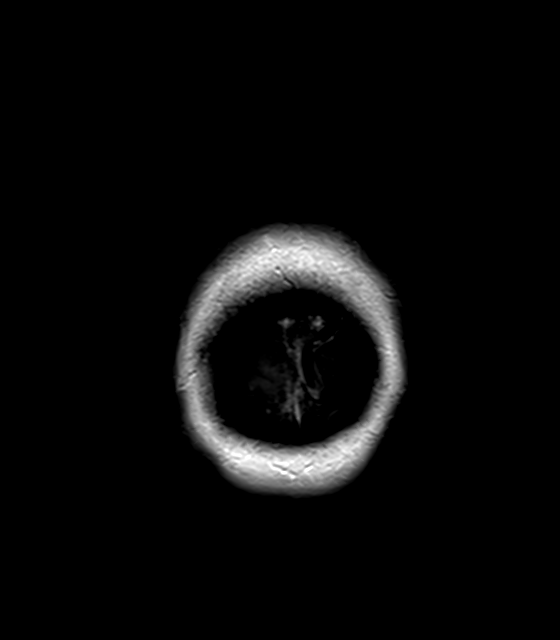
[im 28/28]
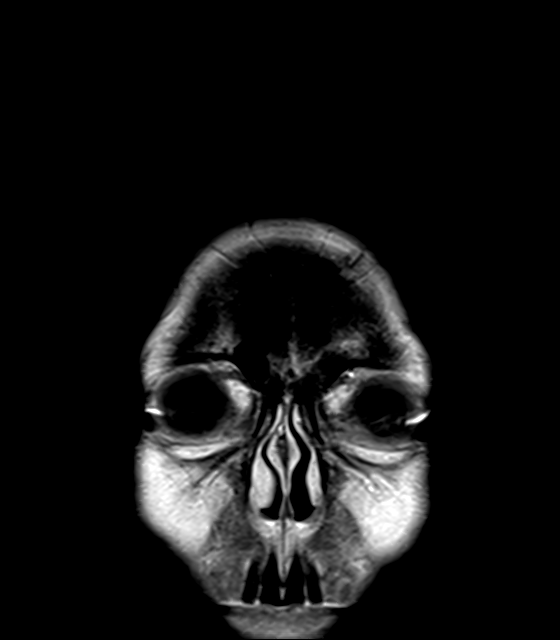

[Series 20: T1 post-contrast · sagittal · 5.0mm · 0.72mm/px · 2 of 25 slices shown (2 of 2)]
[im 1/25]
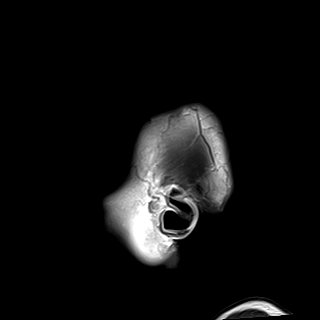
[im 25/25]
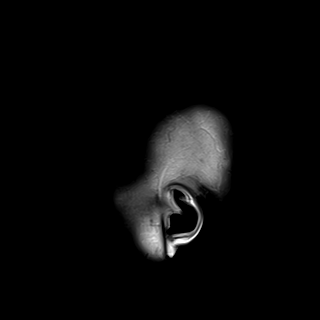

[40 of 48 positions shown; findings below may reference images not displayed]

FINDINGS: Brain: Cerebral volume within normal limits for patient age. Mild
patchy T2/FLAIR hyperintensity seen about the periventricular white
matter, basal ganglia, and pons, nonspecific, but most like related
chronic microvascular ischemic disease. Overall, appearance is mild
in nature.

No abnormal foci of restricted diffusion to suggest acute or
subacute ischemia. Gray-white matter differentiation well
maintained. No encephalomalacia to suggest chronic infarction. No
foci of susceptibility artifact to suggest acute or chronic
intracranial hemorrhage.

No mass lesion, midline shift or mass effect. No hydrocephalus. No
extra-axial fluid collection. Major dural sinuses are grossly
patent.

Pituitary gland and suprasellar region are normal. Midline
structures intact and normal.

No abnormal enhancement.

Vascular: Major intracranial vascular flow voids well maintained and
normal in appearance.

Skull and upper cervical spine: Craniocervical junction normal.
Visualized upper cervical spine within normal limits. Bone marrow
signal intensity normal. No scalp soft tissue abnormality.

Sinuses/Orbits: Globes and orbital soft tissues within normal
limits.

Paranasal sinuses are largely clear. No mastoid effusion. Inner ear
structures grossly normal.

Other: None.
IMPRESSION: 1. No acute intracranial abnormality.
2. Mild T2/FLAIR hyperintensity involving the supratentorial
cerebral white matter, basal ganglia, and pons, nonspecific, but
most likely related to chronic microvascular ischemic disease.
Overall, appearance is mild in nature.

## 2020-03-18 MED ORDER — SODIUM CHLORIDE 0.9 % IV SOLN: 500.0000 mg | Freq: Once | INTRAVENOUS | Status: DC

## 2020-03-18 MED ORDER — KETOROLAC TROMETHAMINE 15 MG/ML IJ SOLN
15.0000 mg | Freq: Once | INTRAMUSCULAR | Status: AC
  Administered 2020-03-18: 15 mg via INTRAVENOUS
  Filled 2020-03-18: qty 1

## 2020-03-18 MED ORDER — ACETAMINOPHEN 325 MG PO TABS: 650.0000 mg | ORAL_TABLET | Freq: Four times a day (QID) | ORAL | Status: DC | PRN

## 2020-03-18 MED ORDER — ACETAMINOPHEN 325 MG PO TABS
650.0000 mg | ORAL_TABLET | Freq: Four times a day (QID) | ORAL | Status: DC | PRN
  Administered 2020-03-18: 650 mg via ORAL
  Filled 2020-03-18: qty 2

## 2020-03-18 MED ORDER — METOPROLOL SUCCINATE ER 100 MG PO TB24
100.0000 mg | ORAL_TABLET | Freq: Every day | ORAL | Status: DC
  Administered 2020-03-18: 100 mg via ORAL
  Filled 2020-03-18: qty 1

## 2020-03-18 MED ORDER — POTASSIUM CHLORIDE CRYS ER 20 MEQ PO TBCR
40.0000 meq | EXTENDED_RELEASE_TABLET | Freq: Once | ORAL | Status: AC
  Administered 2020-03-18: 40 meq via ORAL
  Filled 2020-03-18: qty 2

## 2020-03-18 MED ORDER — CHLORTHALIDONE 50 MG PO TABS
50.0000 mg | ORAL_TABLET | Freq: Every day | ORAL | Status: DC
  Administered 2020-03-18: 50 mg via ORAL
  Filled 2020-03-18: qty 1

## 2020-03-18 MED ORDER — GADOBUTROL 1 MMOL/ML IV SOLN
5.5000 mL | Freq: Once | INTRAVENOUS | Status: AC | PRN
  Administered 2020-03-18: 5.5 mL via INTRAVENOUS

## 2020-03-18 MED ORDER — DEXAMETHASONE SODIUM PHOSPHATE 10 MG/ML IJ SOLN
10.0000 mg | Freq: Once | INTRAMUSCULAR | Status: AC
  Administered 2020-03-18: 10 mg via INTRAVENOUS
  Filled 2020-03-18: qty 1

## 2020-03-18 NOTE — Discharge Instructions (Addendum)
Dear Florina Ou,   Thank you for letting us participate in your care! In this section, you will find a brief hospital admission summary of why you were admitted to the hospital, what happened during your admission, your diagnosis/diagnoses, and recommended follow up.   You were admitted because you were experiencing headache and high BP .   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  You were treated with Iv fluids, pain medications and scopolamine patches.   You were also seen by Neurology. They recommended CTA head, neck, chest, abdomen, and pelvis and MRI Brain .   Your symptoms improved and you were discharged from the hospital for meeting this goal.    POST-HOSPITAL & CARE INSTRUCTIONS 1. F/U with PCP if symptoms comes back. 2. F/U with PCP for high BP. 3. Please let PCP/Specialists know of any changes that were made.  4. Please see medications section of this packet for any medication changes.   DOCTOR'S APPOINTMENT & FOLLOW UP CARE INSTRUCTIONS  No future appointments.   Thank you for choosing [facility]! Take care and be well!  Family Medicine Teaching Service Inpatient Team [place]  [facility]  [address] [place], [place] [address] [phone]

## 2020-03-18 NOTE — Discharge Summary (Addendum)
[facility] Discharge Summary  Patient name: Sharon Friedman Medical record number: [id number] Date of birth: 02-19-1971 Age: 49 y.o. Gender: female Date of Admission: 03/17/2020  Date of Discharge: 03/18/20 Admitting Physician: Alcus Dad, MD  Primary Care Provider: Caren Macadam, MD Consultants: Neuro  Indication for Hospitalization: Headache, High BP  Discharge Diagnoses/Problem List:  Complex Migraine HTN LBBB  Disposition: Home  Discharge Condition: Stable  Discharge Exam:  General: Alert, Not in acute stress. Cardiovascular: S1S2 normal, Denies any complaints.  Respiratory: Breath sounds equal. No wheezes.  Abdomen: soft, non tender Extremities: No edema, B/L pulses equal and strong. Neuro- Strength equal in both extremities. No focal deficits.  Brief Hospital Course:  Assessment and Plan: Sharon Friedman is a 49 y.o. female presenting with headache and hypertension. PMH is significant for HTN, migraines, and vitiligo.    Headache   Left sided weakness: Acute. Patient presents with few day history of generalized headache, worse behind L eye. Also found to have L sided weakness on physical exam. On arrival she was hemodynamically stable and in no acute distress.  Due to neurologic and vascular concerns, a CTA dissection study was performed which did not show any dissection, aneurysm, or central PE.  Additionally had CTA head/neck with CT head without contrast that did not show any hemorrhage, area of ischemia, or large/moderate vessel occlusion.  ED provider discussed with neurology who recommended obtaining a brain MRI. MRI shows no acute intracranial abnormality and Mild T2/FLAIR hyperintensity involving the supratentorial cerebral white matter, basal ganglia, and pons, nonspecific, but most likely related to chronic microvascular ischemic disease. ESR was normal.Troponin obtained 29>29.  EKG left bundle branch block with no  previous EKG for comparison. Given IV fluid bolus and decadron with improvement in headache. Pt's symptoms improved with Tylenol and scopolamine patch. Patient was noted to have elevated blood pressures to 160's/80-90's while admitted and her home antihypertensives were continued.  Given negative work up and improvement in headache, patient safe for discharge with instructions to follow up with PCP for migraines and hypertension.   Issues for Follow Up:  F/U with PCP or Go to ED if symptoms comes back. F/U with PCP for LBBB and high BP.  Significant Procedures:   Significant Labs and Imaging:  Recent Labs  Lab 03/17/20 1713 03/17/20 1729  WBC 9.0  --   HGB 13.6 13.6  HCT 39.4 40.0  PLT 250  --    Recent Labs  Lab 03/17/20 1729 03/17/20 1729 03/17/20 2111 03/17/20 2122 03/18/20 0437  NA 137  --  136  --  137  K 3.5   < > 3.1*  --  3.3*  CL 96*  --  95*  --  101  CO2  --   --  30  --  26  GLUCOSE 100*  --  102*  --  163*  BUN 10  --  9  --  9  CREATININE 0.60  --  0.76  --  0.72  CALCIUM  --   --  9.6  --  9.1  MG  --   --   --  1.8  --   ALKPHOS  --   --  53  --   --   AST  --   --  21  --   --   ALT  --   --  23  --   --   ALBUMIN  --   --  4.2  --   --    < > =  values in this interval not displayed.    Results/Tests Pending at Time of Discharge: None  Discharge Medications:  Allergies as of 03/18/2020       Reactions   Black Walnut Pollen Allergy Skin Test    Other reaction(s): Angioedema   Amlodipine    Dizziness   Food Swelling   Walnut and hazelnut cause tongue "to break out in cuts" and tongue swells   Lisinopril Itching, Swelling   Face gets red, swells up, and itches        Medication List     TAKE these medications    acetaminophen 325 MG tablet Commonly known as: TYLENOL Take 2 tablets (650 mg total) by mouth every 6 (six) hours as needed for mild pain or headache.   chlorthalidone 50 MG tablet Commonly known as: HYGROTON Take 50 mg by  mouth daily.   EXCEDRIN EXTRA STRENGTH PO Take 1 tablet by mouth daily as needed (migraine).   metoprolol succinate 100 MG 24 hr tablet Commonly known as: TOPROL-XL TAKE 1 TABLET BY MOUTH ONCE DAILY WITH MEALS What changed: See the new instructions.   Mirena (52 MG) 20 MCG/24HR IUD Generic drug: levonorgestrel 1 each by Intrauterine route once.        Discharge Instructions: Please refer to Patient Instructions section of EMR for full details.  Patient was counseled important signs and symptoms that should prompt return to medical care, changes in medications, dietary instructions, activity restrictions, and follow up appointments.   Follow-Up Appointments:    Armando Reichert, MD 03/18/2020, 2:09 PM PGY-1, [facility] Upper-Level Resident Addendum I have discussed the above with the original author and agree with their documentation. My edits for correction/addition/clarification are included. Please see also any attending notes.   Wilber Oliphant, M.D.  PGY-3 03/18/2020 2:42 PM

## 2020-03-18 NOTE — Progress Notes (Addendum)
Family Medicine Teaching Service Daily Progress Note Intern Pager: [phone]  Patient name: Sharon Friedman Medical record number: [id number] Date of birth: January 30, 1971 Age: 49 y.o. Gender: female  Primary Care Provider: Caren Macadam, MD Consultants:Neuro Code Status:Full  Pt Overview and Major Events to Date:  Admission on 03/17/2020  Assessment and Plan: Sharon Friedman is a 49 y.o. female presenting with headache and hypertension. PMH is significant for HTN, migraines, and vitiligo.    Headache  Left sided weakness: Acute. Patient presents with few day history of generalized headache, worse behind L eye. Also found to have L sided weakness on physical exam and pronator drift. In the ED patient is hypertensive with systolic BP between 027-741, but all other vitals wnl. CTA head, neck, chest, abdomen, and pelvis were essentially unremarkable. MRI shows no acute intracranial abnormality and Mild T2/FLAIR hyperintensity involving the supratentorial cerebral white matter, basal ganglia, and pons, nonspecific, but most likely related to chronic microvascular ischemic disease. ESR was normal(14). This morning. Pt is feeling better. Denies any weakness. Still C/o throbbing Headache 7/10 in intensity on Left side of forehead. Pt c/o on and off tingling in Left hand.  Denies any nausea /vomiting. Pt can be discharged today.  -OOB with assistance -Vitals per routine -Neuro checks q8h -Tylenol 1039m for headache -Scopolamine patch PRN for nausea associated with headache, will avoid QTC altering antiemetics at this time -IV NS at 100 ml/hour x12 hrs given improvement of headache with bolus earlier and evidence of dehydration via U/a -PT/OT eval and treat -Regular diet.  Left Bundle Branch Block  Chest Pain  Elevated Troponin Pt had chest pain yesterday. This morning, Pt states she is breathing slowly but denies any chest pain, SOB. EKG at her PCP office was "abnormal",. EKG in the ED  demonstrates NSR at 66bpm with left bundle branch block. Troponin mildly elevated at 28>29>29 but have trended flat. UDS negative. Repeat EKG similar to previous. May be related to long term hypertensive changes.  -Will have low threshold to discuss with cardiology if pain happens again.  - Consider ECHO in future.  Hypertension BP this morning was 162/89 mm HG., Pt didn't take her BP meds this morning. Patient with known hx of HTN, on chlorthalidone 538mand metoprolol 10039maily at home. BP in the ED has been elevated to 140287-867stolic.   -Monitor BP -Restart Home meds.   Hypokalemia Patient's K 3.1 on arrival. She was given 35m32mO potassium in the ED. Now the K is 3.3 -Monitor CMP -Give KCL 40 mEq once given.   Abnormal Urinalysis Patient's UA showed large leuks and positive nitrites. Denies urinary symptoms. -Monitor for UTI symptoms -Will not treat as it seems to be asymptomatic bacteruria   Enlarged thyroid: Incidental finding of heterogeneous enlarged thyroid on CT dissection study.  No known history of thyroid disorder. -Consider thyroid ultrasound outpatient   FEN/GI: regular diet Prophylaxis: Lovenox   Disposition: med-tele Home after D/c   Subjective:  Pt is feeling better. Denies any weakness. Still C/o throbbing Headache 7/10 in intensity on Left side of forehead. Pt c/o on and off tingling in Left hand.  Denies any nausea /vomiting.    Objective: Temp:  [97.8 F (36.6 C)-98.4 F (36.9 C)] 98.1 F (36.7 C) (11/18 0300) Pulse Rate:  [55-68] 58 (11/18 0300) Resp:  [10-19] 18 (11/18 0300) BP: (123-179)/(70-112) 123/70 (11/18 0300) SpO2:  [97 %-100 %] 99 % (11/18 0300) Weight:  [58.1 kg] 58.1 kg (11/17 1658) Physical  Exam: General: Alert, Not in acute stress. Cardiovascular: S1S2 normal, Denies any complaints.  Respiratory: Breath sounds equal. No wheezes.  Abdomen: soft, non tender Extremities: No edema, B/L pulses equal and strong. Neuro- Strength  equal in both extremities. No focal deficits.  Laboratory: Recent Labs  Lab 03/17/20 1713 03/17/20 1729  WBC 9.0  --   HGB 13.6 13.6  HCT 39.4 40.0  PLT 250  --    Recent Labs  Lab 03/17/20 1729 03/17/20 2111 03/18/20 0437  NA 137 136 137  K 3.5 3.1* 3.3*  CL 96* 95* 101  CO2  --  30 26  BUN '10 9 9  ' CREATININE 0.60 0.76 0.72  CALCIUM  --  9.6 9.1  PROT  --  7.4  --   BILITOT  --  1.1  --   ALKPHOS  --  53  --   ALT  --  23  --   AST  --  21  --   GLUCOSE 100* 102* 163*      Imaging/Diagnostic Tests: CT Angio Head W/Cm &/Or Wo Cm  Result Date: 03/17/2020 CLINICAL DATA:  Neurological deficit.  Headache.  Blurred vision. EXAM: CT ANGIOGRAPHY HEAD AND NECK TECHNIQUE: Multidetector CT imaging of the head and neck was performed using the standard protocol during bolus administration of intravenous contrast. Multiplanar CT image reconstructions and MIPs were obtained to evaluate the vascular anatomy. Carotid stenosis measurements (when applicable) are obtained utilizing NASCET criteria, using the distal internal carotid diameter as the denominator. CONTRAST:  88m OMNIPAQUE IOHEXOL 350 MG/ML SOLN COMPARISON:  None. FINDINGS: CT HEAD FINDINGS Brain: The brain shows a normal appearance without evidence of malformation, atrophy, old or acute small or large vessel infarction, mass lesion, hemorrhage, hydrocephalus or extra-axial collection. Vascular: No hyperdense vessel. No evidence of atherosclerotic calcification. Skull: Normal.  No traumatic finding.  No focal bone lesion. Sinuses/Orbits: Sinuses are clear. Orbits appear normal. Mastoids are clear. Other: None significant CTA NECK FINDINGS Aortic arch: Normal Right carotid system: Normal. No carotid bifurcation disease. Cervical ICA widely patent. Left carotid system: Common carotid artery widely patent to the bifurcation. Mild atherosclerotic plaque at the ICA bulb but no stenosis. Cervical ICA normal beyond that. Vertebral arteries:  Vertebral artery origins are widely patent. Left subclavian artery shows some soft plaque proximal to the vertebral origin, but no stenosis greater than 20%. Both vertebral arteries appear normal through the cervical region to the foramen magnum. Skeleton: Normal Other neck: No lymphadenopathy.  Heterogeneous enlarged thyroid. Upper chest: Normal Review of the MIP images confirms the above findings CTA HEAD FINDINGS Anterior circulation: Both internal carotid arteries widely patent through the skull base and siphon regions. The anterior and middle cerebral vessels are patent without large or medium vessel occlusion, proximal stenosis, aneurysm or vascular malformation. Posterior circulation: Both vertebral arteries widely patent to the basilar. No basilar stenosis. Posterior circulation branch vessels are normal. Venous sinuses: Patent and normal. Anatomic variants: None significant. Review of the MIP images confirms the above findings IMPRESSION: 1. Normal head CT. 2. No large or medium vessel occlusion. 3. No significant carotid bifurcation disease. Mild atherosclerotic plaque at the left ICA bulb but no stenosis. 4. Left subclavian artery shows some soft plaque proximal to the left vertebral origin, but no stenosis greater than 20%. Both vertebral arteries appear normal through the cervical region to the basilar. 5. Heterogeneous enlarged thyroid. Recommend thyroid ultrasound (ref: J Am Coll Radiol. 2015 Feb;12(2): 143-50). Electronically Signed   By: MElta Guadeloupe  Shogry M.D.   On: 03/17/2020 18:23   CT Head Wo Contrast  Result Date: 03/17/2020 CLINICAL DATA:  Neurological deficit.  Headache.  Blurred vision. EXAM: CT ANGIOGRAPHY HEAD AND NECK TECHNIQUE: Multidetector CT imaging of the head and neck was performed using the standard protocol during bolus administration of intravenous contrast. Multiplanar CT image reconstructions and MIPs were obtained to evaluate the vascular anatomy. Carotid stenosis measurements  (when applicable) are obtained utilizing NASCET criteria, using the distal internal carotid diameter as the denominator. CONTRAST:  93m OMNIPAQUE IOHEXOL 350 MG/ML SOLN COMPARISON:  None. FINDINGS: CT HEAD FINDINGS Brain: The brain shows a normal appearance without evidence of malformation, atrophy, old or acute small or large vessel infarction, mass lesion, hemorrhage, hydrocephalus or extra-axial collection. Vascular: No hyperdense vessel. No evidence of atherosclerotic calcification. Skull: Normal.  No traumatic finding.  No focal bone lesion. Sinuses/Orbits: Sinuses are clear. Orbits appear normal. Mastoids are clear. Other: None significant CTA NECK FINDINGS Aortic arch: Normal Right carotid system: Normal. No carotid bifurcation disease. Cervical ICA widely patent. Left carotid system: Common carotid artery widely patent to the bifurcation. Mild atherosclerotic plaque at the ICA bulb but no stenosis. Cervical ICA normal beyond that. Vertebral arteries: Vertebral artery origins are widely patent. Left subclavian artery shows some soft plaque proximal to the vertebral origin, but no stenosis greater than 20%. Both vertebral arteries appear normal through the cervical region to the foramen magnum. Skeleton: Normal Other neck: No lymphadenopathy.  Heterogeneous enlarged thyroid. Upper chest: Normal Review of the MIP images confirms the above findings CTA HEAD FINDINGS Anterior circulation: Both internal carotid arteries widely patent through the skull base and siphon regions. The anterior and middle cerebral vessels are patent without large or medium vessel occlusion, proximal stenosis, aneurysm or vascular malformation. Posterior circulation: Both vertebral arteries widely patent to the basilar. No basilar stenosis. Posterior circulation branch vessels are normal. Venous sinuses: Patent and normal. Anatomic variants: None significant. Review of the MIP images confirms the above findings IMPRESSION: 1. Normal  head CT. 2. No large or medium vessel occlusion. 3. No significant carotid bifurcation disease. Mild atherosclerotic plaque at the left ICA bulb but no stenosis. 4. Left subclavian artery shows some soft plaque proximal to the left vertebral origin, but no stenosis greater than 20%. Both vertebral arteries appear normal through the cervical region to the basilar. 5. Heterogeneous enlarged thyroid. Recommend thyroid ultrasound (ref: J Am Coll Radiol. 2015 Feb;12(2): 143-50). Electronically Signed   By: MNelson ChimesM.D.   On: 03/17/2020 18:23   CT Angio Neck W and/or Wo Contrast  Result Date: 03/17/2020 CLINICAL DATA:  Neurological deficit.  Headache.  Blurred vision. EXAM: CT ANGIOGRAPHY HEAD AND NECK TECHNIQUE: Multidetector CT imaging of the head and neck was performed using the standard protocol during bolus administration of intravenous contrast. Multiplanar CT image reconstructions and MIPs were obtained to evaluate the vascular anatomy. Carotid stenosis measurements (when applicable) are obtained utilizing NASCET criteria, using the distal internal carotid diameter as the denominator. CONTRAST:  531mOMNIPAQUE IOHEXOL 350 MG/ML SOLN COMPARISON:  None. FINDINGS: CT HEAD FINDINGS Brain: The brain shows a normal appearance without evidence of malformation, atrophy, old or acute small or large vessel infarction, mass lesion, hemorrhage, hydrocephalus or extra-axial collection. Vascular: No hyperdense vessel. No evidence of atherosclerotic calcification. Skull: Normal.  No traumatic finding.  No focal bone lesion. Sinuses/Orbits: Sinuses are clear. Orbits appear normal. Mastoids are clear. Other: None significant CTA NECK FINDINGS Aortic arch: Normal Right carotid system: Normal.  No carotid bifurcation disease. Cervical ICA widely patent. Left carotid system: Common carotid artery widely patent to the bifurcation. Mild atherosclerotic plaque at the ICA bulb but no stenosis. Cervical ICA normal beyond that.  Vertebral arteries: Vertebral artery origins are widely patent. Left subclavian artery shows some soft plaque proximal to the vertebral origin, but no stenosis greater than 20%. Both vertebral arteries appear normal through the cervical region to the foramen magnum. Skeleton: Normal Other neck: No lymphadenopathy.  Heterogeneous enlarged thyroid. Upper chest: Normal Review of the MIP images confirms the above findings CTA HEAD FINDINGS Anterior circulation: Both internal carotid arteries widely patent through the skull base and siphon regions. The anterior and middle cerebral vessels are patent without large or medium vessel occlusion, proximal stenosis, aneurysm or vascular malformation. Posterior circulation: Both vertebral arteries widely patent to the basilar. No basilar stenosis. Posterior circulation branch vessels are normal. Venous sinuses: Patent and normal. Anatomic variants: None significant. Review of the MIP images confirms the above findings IMPRESSION: 1. Normal head CT. 2. No large or medium vessel occlusion. 3. No significant carotid bifurcation disease. Mild atherosclerotic plaque at the left ICA bulb but no stenosis. 4. Left subclavian artery shows some soft plaque proximal to the left vertebral origin, but no stenosis greater than 20%. Both vertebral arteries appear normal through the cervical region to the basilar. 5. Heterogeneous enlarged thyroid. Recommend thyroid ultrasound (ref: J Am Coll Radiol. 2015 Feb;12(2): 143-50). Electronically Signed   By: Nelson Chimes M.D.   On: 03/17/2020 18:23   MR Brain W and Wo Contrast  Result Date: 03/18/2020 CLINICAL DATA:  Initial evaluation for acute neuro deficit, stroke suspected. EXAM: MRI HEAD WITHOUT AND WITH CONTRAST TECHNIQUE: Multiplanar, multiecho pulse sequences of the brain and surrounding structures were obtained without and with intravenous contrast. CONTRAST:  5.78m GADAVIST GADOBUTROL 1 MMOL/ML IV SOLN COMPARISON:  Prior CTs from  03/17/2020. FINDINGS: Brain: Cerebral volume within normal limits for patient age. Mild patchy T2/FLAIR hyperintensity seen about the periventricular white matter, basal ganglia, and pons, nonspecific, but most like related chronic microvascular ischemic disease. Overall, appearance is mild in nature. No abnormal foci of restricted diffusion to suggest acute or subacute ischemia. Gray-white matter differentiation well maintained. No encephalomalacia to suggest chronic infarction. No foci of susceptibility artifact to suggest acute or chronic intracranial hemorrhage. No mass lesion, midline shift or mass effect. No hydrocephalus. No extra-axial fluid collection. Major dural sinuses are grossly patent. Pituitary gland and suprasellar region are normal. Midline structures intact and normal. No abnormal enhancement. Vascular: Major intracranial vascular flow voids well maintained and normal in appearance. Skull and upper cervical spine: Craniocervical junction normal. Visualized upper cervical spine within normal limits. Bone marrow signal intensity normal. No scalp soft tissue abnormality. Sinuses/Orbits: Globes and orbital soft tissues within normal limits. Paranasal sinuses are largely clear. No mastoid effusion. Inner ear structures grossly normal. Other: None. IMPRESSION: 1. No acute intracranial abnormality. 2. Mild T2/FLAIR hyperintensity involving the supratentorial cerebral white matter, basal ganglia, and pons, nonspecific, but most likely related to chronic microvascular ischemic disease. Overall, appearance is mild in nature. Electronically Signed   By: BJeannine BogaM.D.   On: 03/18/2020 02:07   DG Chest Port 1 View  Result Date: 03/17/2020 CLINICAL DATA:  Chest pain EXAM: PORTABLE CHEST 1 VIEW COMPARISON:  04/15/2008 FINDINGS: The heart size and mediastinal contours are within normal limits. Both lungs are clear. The visualized skeletal structures are unremarkable. IMPRESSION: Normal study.  Electronically Signed   By: KLennette Bihari  Dover M.D.   On: 03/17/2020 18:07   CT Angio Chest/Abd/Pel for Dissection W and/or W/WO  Result Date: 03/17/2020 CLINICAL DATA:  Acute chest pain EXAM: CT ANGIOGRAPHY CHEST, ABDOMEN AND PELVIS TECHNIQUE: Non-contrast CT of the chest was initially obtained. Multidetector CT imaging through the chest, abdomen and pelvis was performed using the standard protocol during bolus administration of intravenous contrast. Multiplanar reconstructed images and MIPs were obtained and reviewed to evaluate the vascular anatomy. CONTRAST:  168m OMNIPAQUE IOHEXOL 350 MG/ML SOLN COMPARISON:  None. FINDINGS: CTA CHEST FINDINGS Cardiovascular: --Heart: The heart size is normal.  There is nopericardial effusion. --Aorta: The course and caliber of the thoracic aorta are normal. There is no aortic atherosclerotic calcification. There appears to be some noncalcified plaque seen within the descending intrathoracic aorta. Precontrast images show no aortic intramural hematoma. There is no blood pool, dissection or penetrating ulcer demonstrated on arterial phase postcontrast imaging. There is a conventional 3 vessel aortic arch branching pattern. The proximal arch vessels are widely patent. --Pulmonary Arteries: Contrast timing is optimized for preferential opacification of the aorta. Within that limitation, normal central pulmonary arteries. Mediastinum/Nodes: No mediastinal, hilar or axillary lymphadenopathy. The visualized thyroid and thoracic esophageal course are unremarkable. Lungs/Pleura: No pulmonary nodules or masses. No pleural effusion or pneumothorax. No focal airspace consolidation. No focal pleural abnormality. Musculoskeletal: No chest wall abnormality. No acute osseous findings. Review of the MIP images confirms the above findings. CTA ABDOMEN AND PELVIS FINDINGS VASCULAR Aorta: Normal caliber aorta without aneurysm, dissection, vasculitis or hemodynamically significant stenosis.  Scattered calcifications are seen within the intra-abdominal descending aorta. Celiac: No aneurysm, dissection or hemodynamically significant stenosis. Normal branching pattern SMA: Widely patent without dissection or stenosis. Renals: Single renal arteries bilaterally. No aneurysm, dissection, stenosis or evidence of fibromuscular dysplasia. IMA: There is mild stenosis at the origin the IMA due to atherosclerosis. Inflow: Mild atherosclerosis seen in the bilateral common iliac vasculature. Veins: Normal course and caliber of the major veins. Assessment is otherwise limited by the arterial dominant contrast phase. Review of the MIP images confirms the above findings. NON-VASCULAR Hepatobiliary: Normal hepatic contours and density. No visible biliary dilatation. Normal gallbladder. Pancreas: Normal contours without ductal dilatation. No peripancreatic fluid collection. Spleen: Normal arterial phase splenic enhancement pattern. Adrenals/Urinary Tract: --Adrenal glands: Normal. --Right kidney/ureter: No hydronephrosis or perinephric stranding. No nephrolithiasis. No obstructing ureteral stones. --Left kidney/ureter: No hydronephrosis or perinephric stranding. No nephrolithiasis. No obstructing ureteral stones. --Urinary bladder: Unremarkable. Stomach/Bowel: --Stomach/Duodenum: There is a small hiatal hernia present. Normal duodenal course and caliber. --Small bowel: No dilatation or inflammation. --Colon: No focal abnormality. Lymphatic:  No abdominal or pelvic lymphadenopathy. Reproductive: No free fluid in the pelvis. Musculoskeletal. No bony spinal canal stenosis or focal osseous abnormality. Other: None. Review of the MIP images confirms the above findings. IMPRESSION: 1. No acute aortic abnormality to explain the patient's symptoms. 2. Scattered mild to moderate calcified and noncalcified plaque in the descending intrathoracic aorta and the infrarenal abdominal aorta. 3. Mild stenosis at the origin of the IMA due  to atherosclerosis. 4. No other acute intrathoracic, abdominal, or pelvic pathology. Electronically Signed   By: BPrudencio PairM.D.   On: 03/17/2020 20:20    DArmando Reichert MD 03/18/2020, 6:01 AM PGY-1, CCowleyIntern pager: [phone] text pages welcome

## 2020-03-18 NOTE — Plan of Care (Signed)
Problem: Consults Goal: RH STROKE PATIENT EDUCATION Description: See Patient Education module for education specifics  03/18/2020 1432 by Ross Ludwig, LPN Outcome: Adequate for Discharge 03/18/2020 1041 by Ross Ludwig, LPN Outcome: Progressing Goal: Nutrition Consult-if indicated 03/18/2020 1432 by Ross Ludwig, LPN Outcome: Adequate for Discharge 03/18/2020 1041 by Ross Ludwig, LPN Outcome: Progressing Goal: Diabetes Guidelines if Diabetic/Glucose > 140 Description: If diabetic or lab glucose is > 140 mg/dl - Initiate Diabetes/Hyperglycemia Guidelines & Document Interventions  03/18/2020 1432 by Ross Ludwig, LPN Outcome: Adequate for Discharge 03/18/2020 1041 by Ross Ludwig, LPN Outcome: Progressing   Problem: RH BOWEL ELIMINATION Goal: RH STG MANAGE BOWEL WITH ASSISTANCE Description: STG Manage Bowel with Assistance. 03/18/2020 1432 by Ross Ludwig, LPN Outcome: Adequate for Discharge 03/18/2020 1041 by Ross Ludwig, LPN Outcome: Progressing Goal: RH STG MANAGE BOWEL W/MEDICATION W/ASSISTANCE Description: STG Manage Bowel with Medication with Assistance. 03/18/2020 1432 by Ross Ludwig, LPN Outcome: Adequate for Discharge 03/18/2020 1041 by Ross Ludwig, LPN Outcome: Progressing   Problem: RH BLADDER ELIMINATION Goal: RH STG MANAGE BLADDER WITH ASSISTANCE Description: STG Manage Bladder With Assistance 03/18/2020 1432 by Ross Ludwig, LPN Outcome: Adequate for Discharge 03/18/2020 1041 by Ross Ludwig, LPN Outcome: Progressing Goal: RH STG MANAGE BLADDER WITH MEDICATION WITH ASSISTANCE Description: STG Manage Bladder With Medication With Assistance. 03/18/2020 1432 by Ross Ludwig, LPN Outcome: Adequate for Discharge 03/18/2020 1041 by Ross Ludwig, LPN Outcome: Progressing Goal: RH STG MANAGE BLADDER WITH EQUIPMENT WITH ASSISTANCE Description: STG Manage Bladder With Equipment With Assistance 03/18/2020  1432 by Ross Ludwig, LPN Outcome: Adequate for Discharge 03/18/2020 1041 by Ross Ludwig, LPN Outcome: Progressing   Problem: RH SKIN INTEGRITY Goal: RH STG SKIN FREE OF INFECTION/BREAKDOWN 03/18/2020 1432 by Ross Ludwig, LPN Outcome: Adequate for Discharge 03/18/2020 1041 by Ross Ludwig, LPN Outcome: Progressing Goal: RH STG MAINTAIN SKIN INTEGRITY WITH ASSISTANCE Description: STG Maintain Skin Integrity With Assistance. 03/18/2020 1432 by Ross Ludwig, LPN Outcome: Adequate for Discharge 03/18/2020 1041 by Ross Ludwig, LPN Outcome: Progressing Goal: RH STG ABLE TO PERFORM INCISION/WOUND CARE W/ASSISTANCE Description: STG Able To Perform Incision/Wound Care With Assistance. 03/18/2020 1432 by Ross Ludwig, LPN Outcome: Adequate for Discharge 03/18/2020 1041 by Ross Ludwig, LPN Outcome: Progressing   Problem: RH SAFETY Goal: RH STG ADHERE TO SAFETY PRECAUTIONS W/ASSISTANCE/DEVICE Description: STG Adhere to Safety Precautions With Assistance/Device. 03/18/2020 1432 by Ross Ludwig, LPN Outcome: Adequate for Discharge 03/18/2020 1041 by Ross Ludwig, LPN Outcome: Progressing Goal: RH STG DECREASED RISK OF FALL WITH ASSISTANCE Description: STG Decreased Risk of Fall With Assistance. 03/18/2020 1432 by Ross Ludwig, LPN Outcome: Adequate for Discharge 03/18/2020 1041 by Ross Ludwig, LPN Outcome: Progressing   Problem: RH COGNITION-NURSING Goal: RH STG USES MEMORY AIDS/STRATEGIES W/ASSIST TO PROBLEM SOLVE Description: STG Uses Memory Aids/Strategies With Assistance to Problem Solve. 03/18/2020 1432 by Ross Ludwig, LPN Outcome: Adequate for Discharge 03/18/2020 1041 by Ross Ludwig, LPN Outcome: Progressing Goal: RH STG ANTICIPATES NEEDS/CALLS FOR ASSIST W/ASSIST/CUES Description: STG Anticipates Needs/Calls for Assist With Assistance/Cues. 03/18/2020 1432 by Ross Ludwig, LPN Outcome: Adequate for  Discharge 03/18/2020 1041 by Ross Ludwig, LPN Outcome: Progressing   Problem: RH PAIN MANAGEMENT Goal: RH STG PAIN MANAGED AT OR BELOW PT'S PAIN GOAL 03/18/2020 1432 by Ross Ludwig, LPN Outcome: Adequate for Discharge 03/18/2020 1041 by Ross Ludwig, LPN Outcome: Progressing   Problem: RH  KNOWLEDGE DEFICIT Goal: RH STG INCREASE KNOWLEDGE OF DIABETES 03/18/2020 1432 by Ross Ludwig, LPN Outcome: Adequate for Discharge 03/18/2020 1041 by Ross Ludwig, LPN Outcome: Progressing Goal: RH STG INCREASE KNOWLEDGE OF HYPERTENSION 03/18/2020 1432 by Ross Ludwig, LPN Outcome: Adequate for Discharge 03/18/2020 1041 by Ross Ludwig, LPN Outcome: Progressing Goal: RH STG INCREASE KNOWLEDGE OF DYSPHAGIA/FLUID INTAKE 03/18/2020 1432 by Ross Ludwig, LPN Outcome: Adequate for Discharge 03/18/2020 1041 by Ross Ludwig, LPN Outcome: Progressing Goal: RH STG INCREASE KNOWLEGDE OF HYPERLIPIDEMIA 03/18/2020 1432 by Ross Ludwig, LPN Outcome: Adequate for Discharge 03/18/2020 1041 by Ross Ludwig, LPN Outcome: Progressing Goal: RH STG INCREASE KNOWLEDGE OF STROKE PROPHYLAXIS 03/18/2020 1432 by Ross Ludwig, LPN Outcome: Adequate for Discharge 03/18/2020 1041 by Ross Ludwig, LPN Outcome: Progressing   Problem: RH Vision Goal: RH LTG Vision (Specify) 03/18/2020 1432 by Ross Ludwig, LPN Outcome: Adequate for Discharge 03/18/2020 1041 by Ross Ludwig, LPN Outcome: Progressing   Problem: RH Pre-functional/Other (Specify) Goal: RH LTG Pre-functional (Specify) 03/18/2020 1432 by Ross Ludwig, LPN Outcome: Adequate for Discharge 03/18/2020 1041 by Ross Ludwig, LPN Outcome: Progressing Goal: RH LTG Interdisciplinary (Specify) 1 Description: RH LTG Interdisciplinary (Specify)1 03/18/2020 1432 by Ross Ludwig, LPN Outcome: Adequate for Discharge 03/18/2020 1041 by Ross Ludwig, LPN Outcome: Progressing Goal: RH  LTG Interdisciplinary (Specify) 2 Description: RH LTG Interdisciplinary (Specify) 2  03/18/2020 1432 by Ross Ludwig, LPN Outcome: Adequate for Discharge 03/18/2020 1041 by Ross Ludwig, LPN Outcome: Progressing

## 2020-03-18 NOTE — Plan of Care (Signed)
  Problem: Consults Goal: RH STROKE PATIENT EDUCATION Description: See Patient Education module for education specifics  Outcome: Progressing Goal: Nutrition Consult-if indicated Outcome: Progressing Goal: Diabetes Guidelines if Diabetic/Glucose > 140 Description: If diabetic or lab glucose is > 140 mg/dl - Initiate Diabetes/Hyperglycemia Guidelines & Document Interventions  Outcome: Progressing   Problem: RH BOWEL ELIMINATION Goal: RH STG MANAGE BOWEL WITH ASSISTANCE Description: STG Manage Bowel with Assistance. Outcome: Progressing Goal: RH STG MANAGE BOWEL W/MEDICATION W/ASSISTANCE Description: STG Manage Bowel with Medication with Assistance. Outcome: Progressing   Problem: RH BLADDER ELIMINATION Goal: RH STG MANAGE BLADDER WITH ASSISTANCE Description: STG Manage Bladder With Assistance Outcome: Progressing Goal: RH STG MANAGE BLADDER WITH MEDICATION WITH ASSISTANCE Description: STG Manage Bladder With Medication With Assistance. Outcome: Progressing Goal: RH STG MANAGE BLADDER WITH EQUIPMENT WITH ASSISTANCE Description: STG Manage Bladder With Equipment With Assistance Outcome: Progressing   Problem: RH SKIN INTEGRITY Goal: RH STG SKIN FREE OF INFECTION/BREAKDOWN Outcome: Progressing Goal: RH STG MAINTAIN SKIN INTEGRITY WITH ASSISTANCE Description: STG Maintain Skin Integrity With Assistance. Outcome: Progressing Goal: RH STG ABLE TO PERFORM INCISION/WOUND CARE W/ASSISTANCE Description: STG Able To Perform Incision/Wound Care With Assistance. Outcome: Progressing   Problem: RH SAFETY Goal: RH STG ADHERE TO SAFETY PRECAUTIONS W/ASSISTANCE/DEVICE Description: STG Adhere to Safety Precautions With Assistance/Device. Outcome: Progressing Goal: RH STG DECREASED RISK OF FALL WITH ASSISTANCE Description: STG Decreased Risk of Fall With Assistance. Outcome: Progressing   Problem: RH COGNITION-NURSING Goal: RH STG USES MEMORY AIDS/STRATEGIES W/ASSIST TO PROBLEM  SOLVE Description: STG Uses Memory Aids/Strategies With Assistance to Problem Solve. Outcome: Progressing Goal: RH STG ANTICIPATES NEEDS/CALLS FOR ASSIST W/ASSIST/CUES Description: STG Anticipates Needs/Calls for Assist With Assistance/Cues. Outcome: Progressing   Problem: RH PAIN MANAGEMENT Goal: RH STG PAIN MANAGED AT OR BELOW PT'S PAIN GOAL Outcome: Progressing   Problem: RH KNOWLEDGE DEFICIT Goal: RH STG INCREASE KNOWLEDGE OF DIABETES Outcome: Progressing Goal: RH STG INCREASE KNOWLEDGE OF HYPERTENSION Outcome: Progressing Goal: RH STG INCREASE KNOWLEDGE OF DYSPHAGIA/FLUID INTAKE Outcome: Progressing Goal: RH STG INCREASE KNOWLEGDE OF HYPERLIPIDEMIA Outcome: Progressing Goal: RH STG INCREASE KNOWLEDGE OF STROKE PROPHYLAXIS Outcome: Progressing   Problem: RH Vision Goal: RH LTG Vision (Specify) Outcome: Progressing   Problem: RH Pre-functional/Other (Specify) Goal: RH LTG Pre-functional (Specify) Outcome: Progressing Goal: RH LTG Interdisciplinary (Specify) 1 Description: RH LTG Interdisciplinary (Specify)1 Outcome: Progressing Goal: RH LTG Interdisciplinary (Specify) 2 Description: RH LTG Interdisciplinary (Specify) 2  Outcome: Progressing   

## 2020-03-18 NOTE — Plan of Care (Signed)
Problem: Consults Goal: RH STROKE PATIENT EDUCATION Description: See Patient Education module for education specifics  Outcome: Progressing Goal: Nutrition Consult-if indicated Outcome: Progressing Goal: Diabetes Guidelines if Diabetic/Glucose > 140 Description: If diabetic or lab glucose is > 140 mg/dl - Initiate Diabetes/Hyperglycemia Guidelines & Document Interventions  Outcome: Progressing   Problem: RH BOWEL ELIMINATION Goal: RH STG MANAGE BOWEL WITH ASSISTANCE Description: STG Manage Bowel with Assistance. Outcome: Progressing Goal: RH STG MANAGE BOWEL W/MEDICATION W/ASSISTANCE Description: STG Manage Bowel with Medication with Assistance. Outcome: Progressing   Problem: RH BLADDER ELIMINATION Goal: RH STG MANAGE BLADDER WITH ASSISTANCE Description: STG Manage Bladder With Assistance Outcome: Progressing Goal: RH STG MANAGE BLADDER WITH MEDICATION WITH ASSISTANCE Description: STG Manage Bladder With Medication With Assistance. Outcome: Progressing Goal: RH STG MANAGE BLADDER WITH EQUIPMENT WITH ASSISTANCE Description: STG Manage Bladder With Equipment With Assistance Outcome: Progressing   Problem: RH SKIN INTEGRITY Goal: RH STG SKIN FREE OF INFECTION/BREAKDOWN Outcome: Progressing Goal: RH STG MAINTAIN SKIN INTEGRITY WITH ASSISTANCE Description: STG Maintain Skin Integrity With Assistance. Outcome: Progressing Goal: RH STG ABLE TO PERFORM INCISION/WOUND CARE W/ASSISTANCE Description: STG Able To Perform Incision/Wound Care With Assistance. Outcome: Progressing   Problem: RH SAFETY Goal: RH STG ADHERE TO SAFETY PRECAUTIONS W/ASSISTANCE/DEVICE Description: STG Adhere to Safety Precautions With Assistance/Device. Outcome: Progressing Goal: RH STG DECREASED RISK OF FALL WITH ASSISTANCE Description: STG Decreased Risk of Fall With Assistance. Outcome: Progressing   Problem: RH COGNITION-NURSING Goal: RH STG USES MEMORY AIDS/STRATEGIES W/ASSIST TO PROBLEM  SOLVE Description: STG Uses Memory Aids/Strategies With Assistance to Problem Solve. Outcome: Progressing Goal: RH STG ANTICIPATES NEEDS/CALLS FOR ASSIST W/ASSIST/CUES Description: STG Anticipates Needs/Calls for Assist With Assistance/Cues. Outcome: Progressing   Problem: RH PAIN MANAGEMENT Goal: RH STG PAIN MANAGED AT OR BELOW PT'S PAIN GOAL Outcome: Progressing   Problem: RH KNOWLEDGE DEFICIT Goal: RH STG INCREASE KNOWLEDGE OF DIABETES Outcome: Progressing Goal: RH STG INCREASE KNOWLEDGE OF HYPERTENSION Outcome: Progressing Goal: RH STG INCREASE KNOWLEDGE OF DYSPHAGIA/FLUID INTAKE Outcome: Progressing Goal: RH STG INCREASE KNOWLEGDE OF HYPERLIPIDEMIA Outcome: Progressing Goal: RH STG INCREASE KNOWLEDGE OF STROKE PROPHYLAXIS Outcome: Progressing   Problem: RH Vision Goal: RH LTG Vision (Specify) Outcome: Progressing   Problem: RH Pre-functional/Other (Specify) Goal: RH LTG Pre-functional (Specify) Outcome: Progressing Goal: RH LTG Interdisciplinary (Specify) 1 Description: RH LTG Interdisciplinary (Specify)1 Outcome: Progressing Goal: RH LTG Interdisciplinary (Specify) 2 Description: RH LTG Interdisciplinary (Specify) 2  Outcome: Progressing   Problem: Consults Goal: RH STROKE PATIENT EDUCATION Description: See Patient Education module for education specifics  Outcome: Progressing Goal: Nutrition Consult-if indicated Outcome: Progressing Goal: Diabetes Guidelines if Diabetic/Glucose > 140 Description: If diabetic or lab glucose is > 140 mg/dl - Initiate Diabetes/Hyperglycemia Guidelines & Document Interventions  Outcome: Progressing   Problem: RH BOWEL ELIMINATION Goal: RH STG MANAGE BOWEL WITH ASSISTANCE Description: STG Manage Bowel with Assistance. Outcome: Progressing Goal: RH STG MANAGE BOWEL W/MEDICATION W/ASSISTANCE Description: STG Manage Bowel with Medication with Assistance. Outcome: Progressing   Problem: RH BLADDER ELIMINATION Goal: RH STG MANAGE  BLADDER WITH ASSISTANCE Description: STG Manage Bladder With Assistance Outcome: Progressing Goal: RH STG MANAGE BLADDER WITH MEDICATION WITH ASSISTANCE Description: STG Manage Bladder With Medication With Assistance. Outcome: Progressing Goal: RH STG MANAGE BLADDER WITH EQUIPMENT WITH ASSISTANCE Description: STG Manage Bladder With Equipment With Assistance Outcome: Progressing   Problem: RH SKIN INTEGRITY Goal: RH STG SKIN FREE OF INFECTION/BREAKDOWN Outcome: Progressing Goal: RH STG MAINTAIN SKIN INTEGRITY WITH ASSISTANCE Description: STG Maintain Skin Integrity With Assistance. Outcome: Progressing Goal: RH STG ABLE  TO PERFORM INCISION/WOUND CARE W/ASSISTANCE Description: STG Able To Perform Incision/Wound Care With Assistance. Outcome: Progressing   Problem: RH SAFETY Goal: RH STG ADHERE TO SAFETY PRECAUTIONS W/ASSISTANCE/DEVICE Description: STG Adhere to Safety Precautions With Assistance/Device. Outcome: Progressing Goal: RH STG DECREASED RISK OF FALL WITH ASSISTANCE Description: STG Decreased Risk of Fall With Assistance. Outcome: Progressing   Problem: RH COGNITION-NURSING Goal: RH STG USES MEMORY AIDS/STRATEGIES W/ASSIST TO PROBLEM SOLVE Description: STG Uses Memory Aids/Strategies With Assistance to Problem Solve. Outcome: Progressing Goal: RH STG ANTICIPATES NEEDS/CALLS FOR ASSIST W/ASSIST/CUES Description: STG Anticipates Needs/Calls for Assist With Assistance/Cues. Outcome: Progressing   Problem: RH PAIN MANAGEMENT Goal: RH STG PAIN MANAGED AT OR BELOW PT'S PAIN GOAL Outcome: Progressing   Problem: RH KNOWLEDGE DEFICIT Goal: RH STG INCREASE KNOWLEDGE OF DIABETES Outcome: Progressing Goal: RH STG INCREASE KNOWLEDGE OF HYPERTENSION Outcome: Progressing Goal: RH STG INCREASE KNOWLEDGE OF DYSPHAGIA/FLUID INTAKE Outcome: Progressing Goal: RH STG INCREASE KNOWLEGDE OF HYPERLIPIDEMIA Outcome: Progressing Goal: RH STG INCREASE KNOWLEDGE OF STROKE  PROPHYLAXIS Outcome: Progressing   Problem: RH Vision Goal: RH LTG Vision (Specify) Outcome: Progressing   Problem: RH Pre-functional/Other (Specify) Goal: RH LTG Pre-functional (Specify) Outcome: Progressing Goal: RH LTG Interdisciplinary (Specify) 1 Description: RH LTG Interdisciplinary (Specify)1 Outcome: Progressing Goal: RH LTG Interdisciplinary (Specify) 2 Description: RH LTG Interdisciplinary (Specify) 2  Outcome: Progressing

## 2020-03-18 NOTE — Plan of Care (Signed)
  Problem: Consults Goal: RH STROKE PATIENT EDUCATION Description: See Patient Education module for education specifics  Outcome: Progressing Goal: Nutrition Consult-if indicated Outcome: Progressing Goal: Diabetes Guidelines if Diabetic/Glucose > 140 Description: If diabetic or lab glucose is > 140 mg/dl - Initiate Diabetes/Hyperglycemia Guidelines & Document Interventions  Outcome: Progressing   Problem: RH BOWEL ELIMINATION Goal: RH STG MANAGE BOWEL WITH ASSISTANCE Description: STG Manage Bowel with Assistance. Outcome: Progressing Goal: RH STG MANAGE BOWEL W/MEDICATION W/ASSISTANCE Description: STG Manage Bowel with Medication with Assistance. Outcome: Progressing   Problem: RH BLADDER ELIMINATION Goal: RH STG MANAGE BLADDER WITH ASSISTANCE Description: STG Manage Bladder With Assistance Outcome: Progressing Goal: RH STG MANAGE BLADDER WITH MEDICATION WITH ASSISTANCE Description: STG Manage Bladder With Medication With Assistance. Outcome: Progressing Goal: RH STG MANAGE BLADDER WITH EQUIPMENT WITH ASSISTANCE Description: STG Manage Bladder With Equipment With Assistance Outcome: Progressing   Problem: RH SKIN INTEGRITY Goal: RH STG SKIN FREE OF INFECTION/BREAKDOWN Outcome: Progressing Goal: RH STG MAINTAIN SKIN INTEGRITY WITH ASSISTANCE Description: STG Maintain Skin Integrity With Assistance. Outcome: Progressing Goal: RH STG ABLE TO PERFORM INCISION/WOUND CARE W/ASSISTANCE Description: STG Able To Perform Incision/Wound Care With Assistance. Outcome: Progressing   Problem: RH SAFETY Goal: RH STG ADHERE TO SAFETY PRECAUTIONS W/ASSISTANCE/DEVICE Description: STG Adhere to Safety Precautions With Assistance/Device. Outcome: Progressing Goal: RH STG DECREASED RISK OF FALL WITH ASSISTANCE Description: STG Decreased Risk of Fall With Assistance. Outcome: Progressing

## 2020-03-18 NOTE — Progress Notes (Signed)
Pt. Discharged in stable condition via car with significant other. AVS documentation given and explained.

## 2020-03-18 NOTE — ED Notes (Signed)
Patient transported to MRI 

## 2020-03-18 NOTE — Progress Notes (Signed)
MRI brain returned without any acute intracranial pathology.  Did show mild hyperintensity that was likely related to chronic microvascular disease in the setting of her known hypertension.  Discussed with Dr. Wilford Corner, neurology, who reviewed images and did not feel she required any further acute neurological work-up or consultation at this time.  Discussed atypical migraine could be a possible etiology and could consider giving Solu-Medrol 500 mg IV with an additional dose in ~8 hours if needed +/-additional migraine cocktail to see if this makes a difference if headache still persistent.  Will discuss recent findings with patient.  Allayne Stack, DO

## 2020-03-18 NOTE — Hospital Course (Addendum)
Assessment and Plan: Sharon Friedman is a 49 y.o. female presenting with headache and hypertension. PMH is significant for HTN, migraines, and vitiligo.    Headache  Left sided weakness: Acute. Patient presents with few day history of generalized headache, worse behind L eye. Also found to have L sided weakness on physical exam. On arrival she was hemodynamically stable and in no acute distress.  Due to neurologic and vascular concerns, a CTA dissection study was performed which did not show any dissection, aneurysm, or central PE.  Additionally had CTA head/neck with CT head without contrast that did not show any hemorrhage, area of ischemia, or large/moderate vessel occlusion.  ED provider discussed with neurology who recommended obtaining a brain MRI. MRI shows no acute intracranial abnormality and Mild T2/FLAIR hyperintensity involving the supratentorial cerebral white matter, basal ganglia, and pons, nonspecific, but most likely related to chronic microvascular ischemic disease. ESR was normal.Troponin obtained 28>29.  EKG left bundle branch block with no previous EKG.  Given IV fluid bolus with improvement in headache. Pt's symptoms improved with Tylenol and scopolamine patch. Pt's high BP controlled with her home medications of chlorthalidone 47m and metoprolol 100 mg. Pt denied any weakness at discharge.

## 2020-03-18 NOTE — Progress Notes (Signed)
Repeat EKG similar to previous, still showing LBBB.  No chest pain.  May be related to long-term hypertensive changes.  Should consider echo/stress test in the future.  Discussed MRI results with patient at bedside and neurology recommendations.  Despite Tylenol and fluids, her headache persists.  She reports it feels like a "bruise" over the entire left side of her head/behind her left eye.  She is nonspecifically tender around the left temple area.  Strength in left upper extremity appears improved now 5/5, still slightly weak in left lower extremity.  She would like to try some abortive therapy, hesitant for high-dose steroids (just due to family experiences).  While still low suspicion for temporal arteritis, will obtain ESR with some reported tenderness in that area.  Will try 1 dose Toradol and IV Decadron.  Patriciaann Clan, DO

## 2021-05-12 ENCOUNTER — Encounter: Payer: Self-pay | Admitting: Physician Assistant

## 2021-05-12 ENCOUNTER — Ambulatory Visit (INDEPENDENT_AMBULATORY_CARE_PROVIDER_SITE_OTHER): Payer: 59 | Admitting: Physician Assistant

## 2021-05-12 ENCOUNTER — Ambulatory Visit (INDEPENDENT_AMBULATORY_CARE_PROVIDER_SITE_OTHER): Payer: 59

## 2021-05-12 DIAGNOSIS — M542 Cervicalgia: Secondary | ICD-10-CM

## 2021-05-12 MED ORDER — METHOCARBAMOL 500 MG PO TABS: 500.0000 mg | ORAL_TABLET | Freq: Four times a day (QID) | ORAL | 0 refills | Status: DC | PRN

## 2021-05-12 MED ORDER — MELOXICAM 15 MG PO TABS: 15.0000 mg | ORAL_TABLET | Freq: Every day | ORAL | 2 refills | Status: DC
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
## 2021-05-23 ENCOUNTER — Telehealth: Payer: Self-pay | Admitting: Orthopaedic Surgery

## 2021-05-23 NOTE — Telephone Encounter (Signed)
Called and left pt vm to call and set an MRI Review appt with Dr. Durward Fortes after 05/30/21

## 2021-05-30 ENCOUNTER — Other Ambulatory Visit: Payer: Self-pay

## 2021-05-30 ENCOUNTER — Ambulatory Visit (HOSPITAL_COMMUNITY)
Admission: RE | Admit: 2021-05-30 | Discharge: 2021-05-30 | Disposition: A | Discharge status: Home / Self Care | Payer: 59 | Source: Ambulatory Visit | Attending: Physician Assistant | Admitting: Physician Assistant

## 2021-05-30 DIAGNOSIS — M542 Cervicalgia: Secondary | ICD-10-CM | POA: Insufficient documentation

## 2021-05-30 IMAGING — MR MR CERVICAL SPINE W/O CM
4 of 5 series · 19 of 48 positions shown · non-contrast
Comparison: None.

CLINICAL DATA: Chronic neck pain.  Cervicalgia.

EXAM:
MRI CERVICAL SPINE WITHOUT CONTRAST
TECHNIQUE: Multiplanar, multisequence MR imaging of the cervical spine was
performed. No intravenous contrast was administered.

[Series 3: T2 · sagittal · 3.0mm · 0.35mm/px · 5 of 16 slices shown (1 of 2)]
[im 1/16]
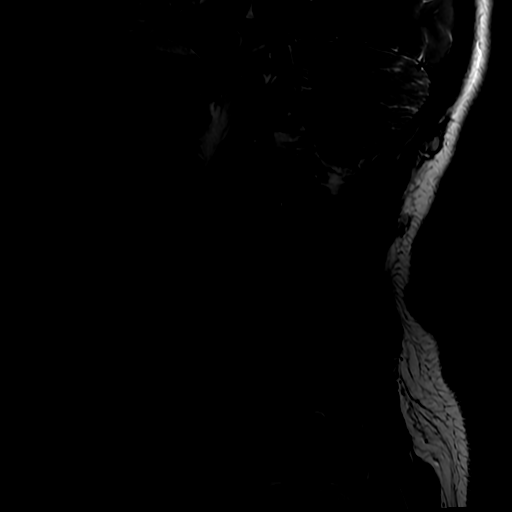
[im 4/16]
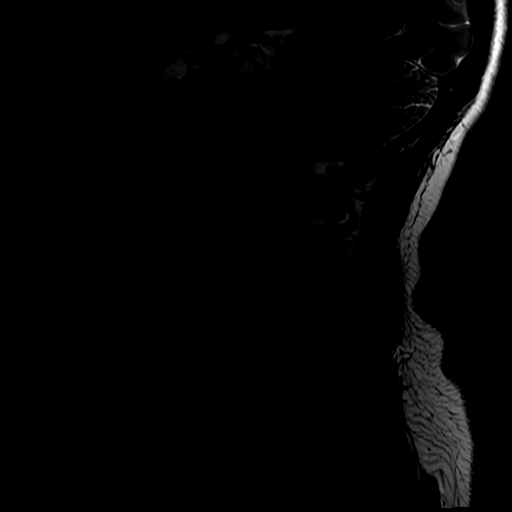
[im 8/16]
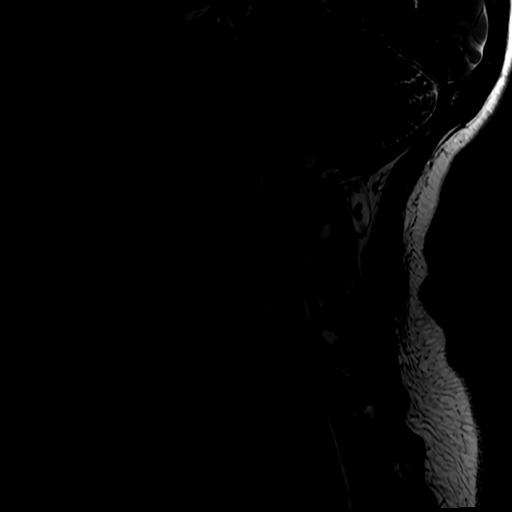
[im 12/16]
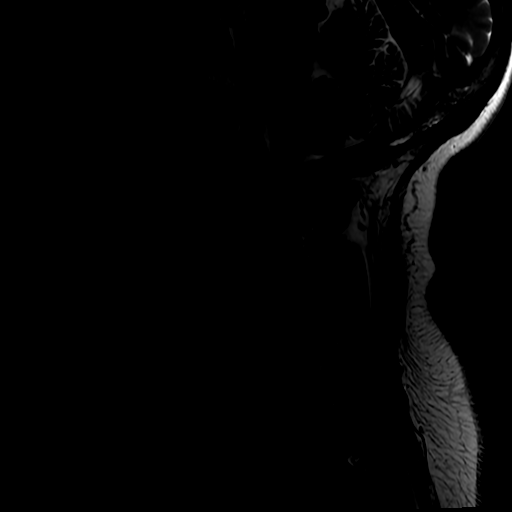
[im 16/16]
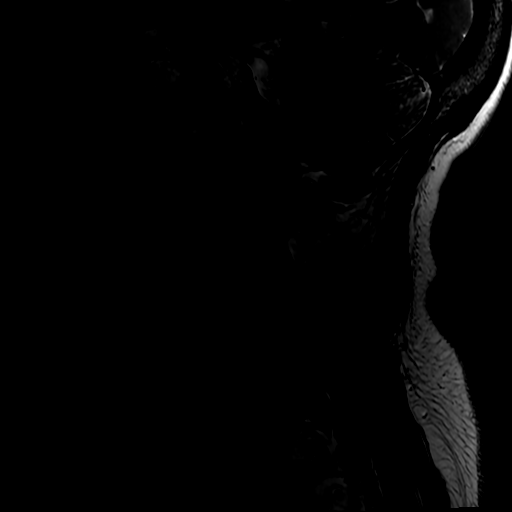

[Series 4: FLAIR · sagittal · 3.0mm · 0.35mm/px · 3 of 16 slices shown]
[im 1/16]
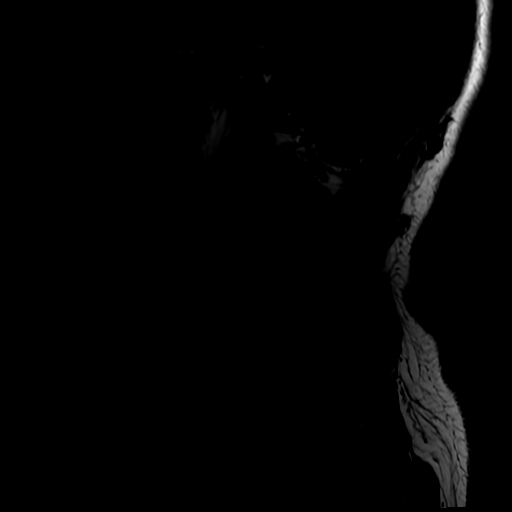
[im 11/16]
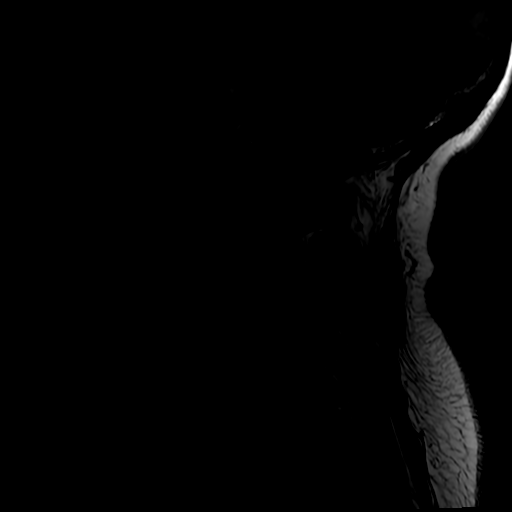
[im 16/16]
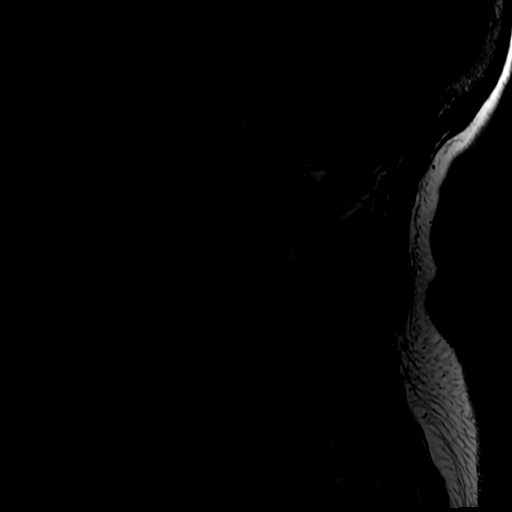

[Series 5: STIR · sagittal · 3.0mm · 0.35mm/px · 3 of 16 slices shown]
[im 1/16]
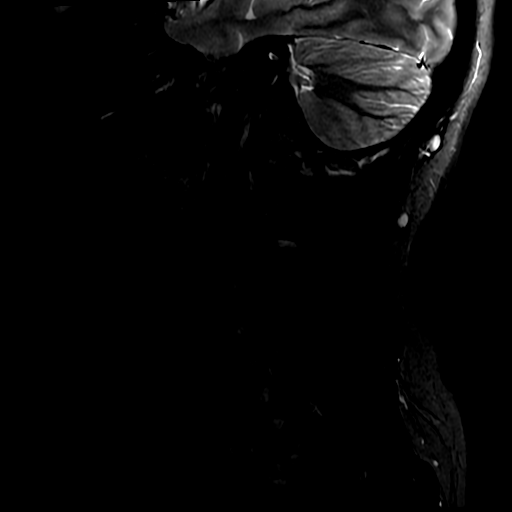
[im 11/16]
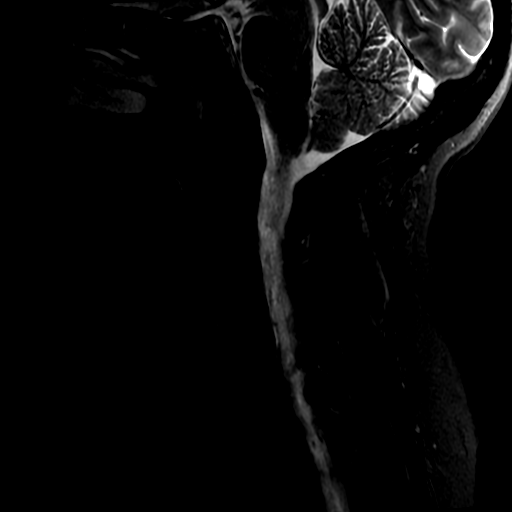
[im 16/16]
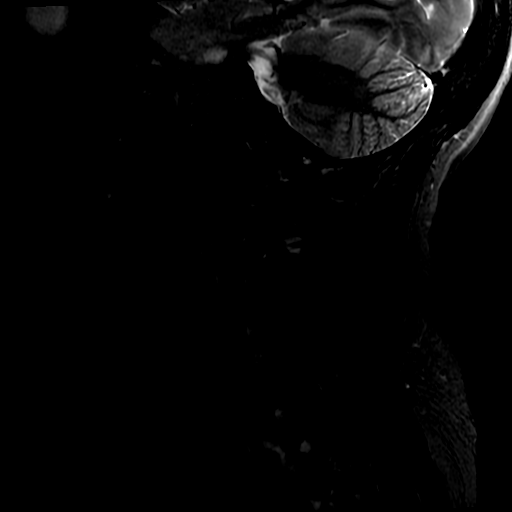

[Series 7: T2 · axial · 3.0mm · 0.35mm/px · z∈[-124,-43]mm · 8 of 34 slices shown (2 of 2)]
[im 1/34]
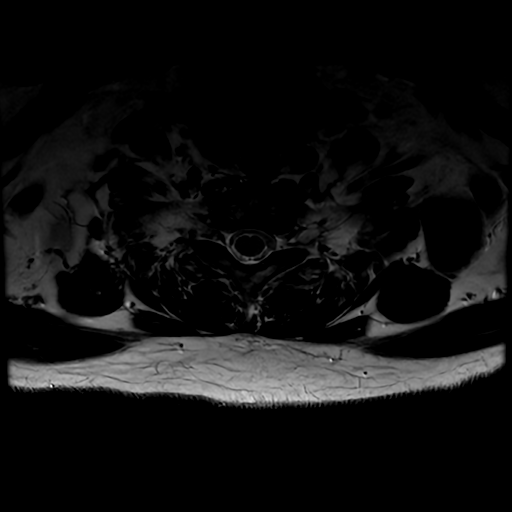
[im 5/34]
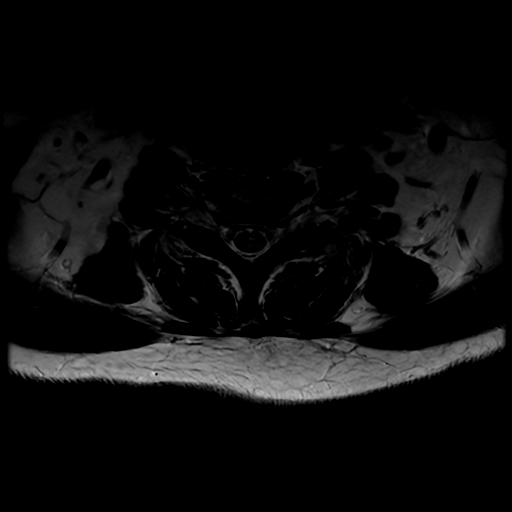
[im 9/34]
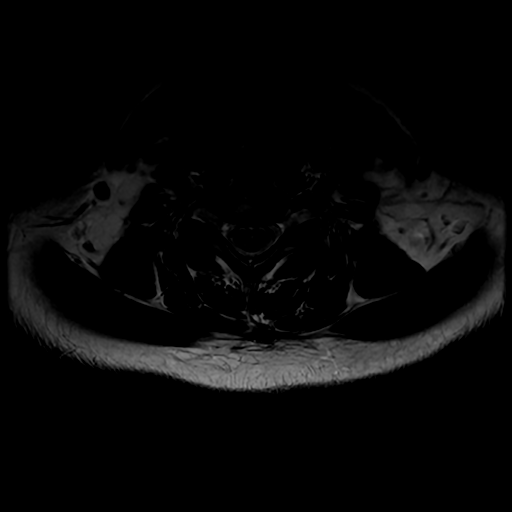
[im 13/34]
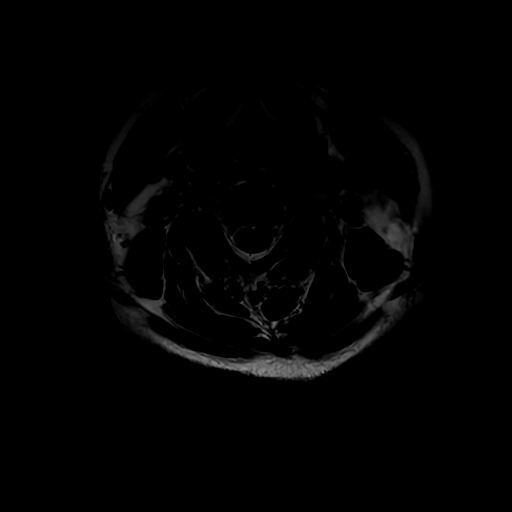
[im 17/34]
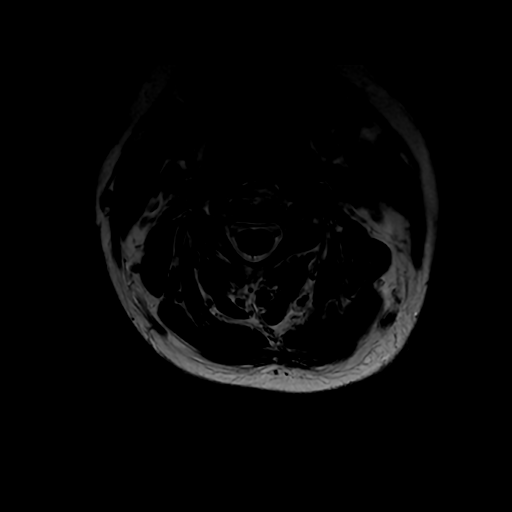
[im 21/34]
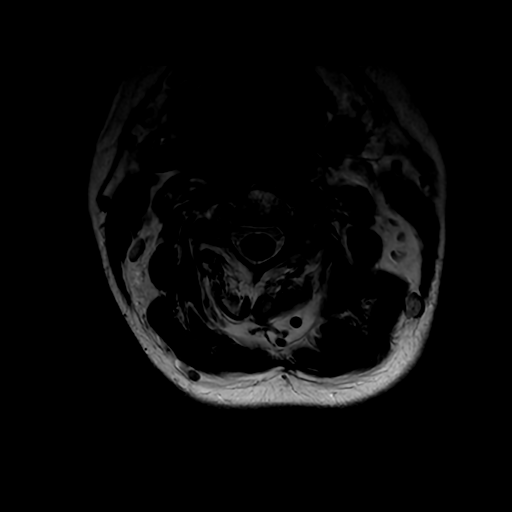
[im 25/34]
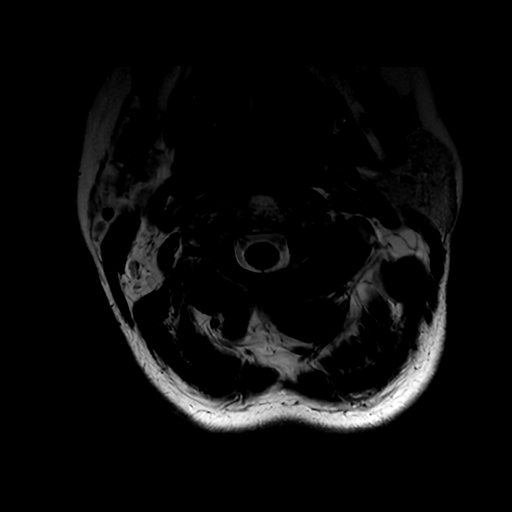
[im 29/34]
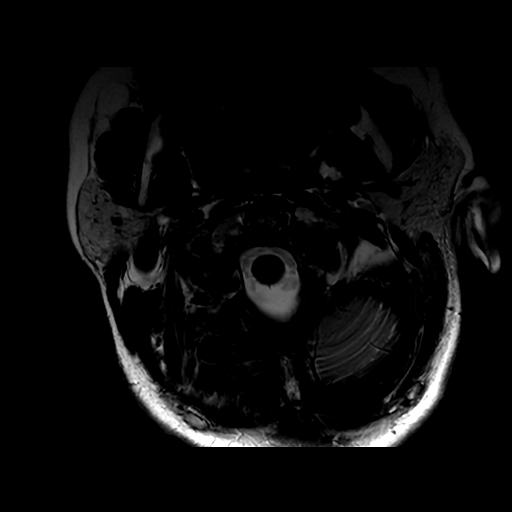

[19 of 48 positions shown; findings below may reference images not displayed]

FINDINGS: Alignment: Normal

Vertebrae: No fracture, evidence of discitis, or bone lesion.

Cord: Normal signal and morphology.

Posterior Fossa, vertebral arteries, paraspinal tissues: Normal

Disc levels:

C1-2: Unremarkable.

C2-3: Mild facet hypertrophy. No disc herniation. There is no spinal
canal stenosis. No neural foraminal stenosis.

C3-4: Normal disc space and facet joints. There is no spinal canal
stenosis. No neural foraminal stenosis.

C4-5: Normal disc space and facet joints. There is no spinal canal
stenosis. No neural foraminal stenosis.

C5-6: Normal disc space and facet joints. There is no spinal canal
stenosis. No neural foraminal stenosis.

C6-7: Small disc bulge minimally narrowing the ventral thecal sac.
There is no spinal canal stenosis. No neural foraminal stenosis.

C7-T1: Normal disc space and facet joints. There is no spinal canal
stenosis. No neural foraminal stenosis.
IMPRESSION: Minimal degenerative disc disease without spinal canal or neural
foraminal stenosis.

## 2021-06-03 ENCOUNTER — Ambulatory Visit (INDEPENDENT_AMBULATORY_CARE_PROVIDER_SITE_OTHER): Payer: 59 | Admitting: Neurology

## 2021-06-03 ENCOUNTER — Encounter: Payer: Self-pay | Admitting: Neurology

## 2021-06-03 ENCOUNTER — Telehealth: Payer: Self-pay | Admitting: Neurology

## 2021-06-03 VITALS — BP 134/82 | HR 61 | Ht 61.0 in | Wt 134.0 lb

## 2021-06-03 DIAGNOSIS — F0781 Postconcussional syndrome: Secondary | ICD-10-CM | POA: Diagnosis not present

## 2021-06-03 DIAGNOSIS — S0990XA Unspecified injury of head, initial encounter: Secondary | ICD-10-CM | POA: Diagnosis not present

## 2021-06-03 DIAGNOSIS — M5481 Occipital neuralgia: Secondary | ICD-10-CM

## 2021-06-03 DIAGNOSIS — R4189 Other symptoms and signs involving cognitive functions and awareness: Secondary | ICD-10-CM

## 2021-06-03 DIAGNOSIS — S060X9A Concussion with loss of consciousness of unspecified duration, initial encounter: Secondary | ICD-10-CM

## 2021-06-03 DIAGNOSIS — R5383 Other fatigue: Secondary | ICD-10-CM

## 2021-06-03 DIAGNOSIS — R404 Transient alteration of awareness: Secondary | ICD-10-CM

## 2021-06-03 DIAGNOSIS — S134XXA Sprain of ligaments of cervical spine, initial encounter: Secondary | ICD-10-CM | POA: Diagnosis not present

## 2021-06-03 DIAGNOSIS — R55 Syncope and collapse: Secondary | ICD-10-CM

## 2021-06-03 NOTE — Patient Instructions (Addendum)
MRI of the brain w/wo contrast - will call EEG - schedule at front Physical therapy - [address] stop by on your way out to make appointment Can consider Occipital Nerve Blocks if not better with physical therapy and muscle relaxer(see below) and send me a message on mychart and we can schedule for you    Concussion, Adult A concussion is a brain injury from a hard, direct hit (trauma) to the head or body. This direct hit causes the brain to shake quickly back and forth inside the skull. This can damage brain cells and cause chemical changes in the brain. A concussion may also be known as a mild traumatic brain injury (TBI). Concussions are usually not life-threatening, but the effects of a concussion can be serious. If you have a concussion, you should be very careful to avoid having a second concussion. What are the causes? This condition is caused by: A direct hit to your head, such as: Running into another player during a game. Being hit in a fight. Hitting your head on a hard surface. Sudden movement of your body that causes your brain to move back and forth inside the skull, such as in a car crash. What are the signs or symptoms? The signs of a concussion can be hard to notice. Early on, they may be missed by you, family members, and health care providers. You may look fine on the outside but may act or feel differently. Every head injury is different. Symptoms are usually temporary but may last for days, weeks, or even months. Some symptoms appear right away, but other symptoms may not show up for hours or days. If your symptoms last longer than normal, you may have post-concussion syndrome. Physical symptoms Headaches. Dizziness and problems with coordination or balance. Sensitivity to light or noise. Nausea or vomiting. Tiredness (fatigue). Vision or hearing problems. Changes in eating or sleeping patterns. Seizure. Mental and emotional symptoms Irritability or mood  changes. Memory problems. Trouble concentrating, organizing, or making decisions. Slowness in thinking, acting or reacting, speaking, or reading. Anxiety or depression. How is this diagnosed? This condition is diagnosed based on: Your symptoms. A description of your injury. You may also have tests, including: Imaging tests, such as a CT scan or an MRI. Neuropsychological tests. These measure your thinking, understanding, learning, and remembering abilities. How is this treated? Treatment for this condition includes: Stopping sports or activity if you are injured. If you hit your head or show signs of concussion: Do not return to sports or activities the same day. Get checked by a health care provider before you return to your activities. Physical and mental rest and careful observation, usually at home. Gradually return to your normal activities. Medicines to help with symptoms such as headaches, nausea, or difficulty sleeping. Avoid taking opioid pain medicine while recovering from a concussion. Avoiding alcohol and drugs. These may slow your recovery and can put you at risk of further injury. Referral to a concussion clinic or rehabilitation center. Recovery from a concussion can take time. How fast you recover depends on many factors. Return to activities only when: Your symptoms are completely gone. Your health care provider says that it is safe. Follow these instructions at home: Activity Limit activities that require a lot of thought or concentration, such as: Doing homework or job-related work. Watching TV. Working on the computer or phone. Playing memory games and puzzles. Rest. Rest helps your brain heal. Make sure you: Get plenty of sleep. Most adults  should get 7-9 hours of sleep each night. Rest during the day. Take naps or rest breaks when you feel tired. Avoid physical activity like exercise until your health care provider says it is safe. Stop any activity that  worsens symptoms. Do not do high-risk activities that could cause a second concussion, such as riding a bike or playing sports. Ask your health care provider when you can return to your normal activities, such as school, work, athletics, and driving. Your ability to react may be slower after a brain injury. Never do these activities if you are dizzy. Your health care provider will likely give you a plan for gradually returning to activities. General instructions  Take over-the-counter and prescription medicines only as told by your health care provider. Some medicines, such as blood thinners (anticoagulants) and aspirin, may increase the risk for complications, such as bleeding. Do not drink alcohol until your health care provider says you can. Watch your symptoms and tell others around you to do the same. Complications sometimes occur after a concussion. Older adults with a brain injury may have a higher risk of serious complications. Tell your work Production designer, theatre/television/film, teachers, Tax adviser, school counselor, coach, or Event organiser about your injury, symptoms, and restrictions. Keep all follow-up visits as told by your health care provider. This is important. How is this prevented? Avoiding another brain injury is very important. In rare cases, another injury can lead to permanent brain damage, brain swelling, or death. The risk of this is greatest during the first 7-10 days after a head injury. Avoid injuries by: Stopping activities that could lead to a second concussion, such as contact or recreational sports, until your health care provider says it is okay. Taking these actions once you have returned to sports or activities: Avoiding plays or moves that can cause you to crash into another person. This is how most concussions occur. Following the rules and being respectful of other players. Do not engage in violent or illegal plays. Getting regular exercise that includes strength and balance  training. Wearing a properly fitting helmet during sports, biking, or other activities. Helmets can help protect you from serious skull and brain injuries, but they may not protect you from a concussion. Even when wearing a helmet, you should avoid being hit in the head. Contact a health care provider if: Your symptoms do not improve. You have new symptoms. You have another injury. Get help right away if: You have new or worsening physical symptoms, such as: A severe or worsening headache. Weakness or numbness in any part of your body, slurred speech, vision changes, or confusion. Your coordination gets worse. Vomiting repeatedly. You have a seizure. You have unusual behavior changes. You lose consciousness, are sleepier than normal, or are difficult to wake up. These symptoms may represent a serious problem that is an emergency. Do not wait to see if the symptoms will go away. Get medical help right away. Call your local emergency services (911 in the [place] Do not drive yourself to the hospital. Summary A concussion is a brain injury that results from a hard, direct hit (trauma) to your head or body. You may have imaging tests and neuropsychological tests to diagnose a concussion. Treatment for this condition includes physical and mental rest and careful observation. Ask your health care provider when you can return to your normal activities, such as school, work, athletics, and driving. Get help right away if you have a severe headache, weakness in any part of the body,  seizures, behavior changes, changes in vision, or if you are confused or sleepier than normal. This information is not intended to replace advice given to you by your health care provider. Make sure you discuss any questions you have with your health care provider. Document Revised: 07/01/2020 Document Reviewed: 07/01/2020 Elsevier Patient Education  [place].   Vasovagal Syncope Not what you're looking for?  START NEW SEARCH ABOUT CAUSES DIAGNOSIS TREATMENT NEXT STEPS What is vasovagal syncope? Vasovagal syncope is a condition that leads to fainting in some people. It's also called neurocardiogenic syncope or reflex syncope. It's the most common cause of fainting. It's usually not harmful and not a sign of a more serious problem.  Many nerves connect with your heart and blood vessels. These nerves help control the speed and force of your heartbeat. They also regulate blood pressure by controlling whether your blood vessels widen or tighten. Usually, these nerves coordinate their actions so you always get enough blood to your brain. Under certain situations, these nerves might give an inappropriate signal. This might cause your blood vessels to open wide. At the same time, your heartbeat may slow down. Blood can pool in your legs which leads to a drop in blood pressure, and not enough of it may reach the brain. If that happens, you may briefly lose consciousness. When you lie or fall down, blood flow to the brain resumes.  Vasovagal syncope is quite common. It most often affects children and young adults, but it can happen at any age. It happens to men and women in about equal numbers. Unlike some other causes of fainting, vasovagal syncope does not signal an underlying problem with the heart or brain.  What causes vasovagal syncope? Several triggers can cause vasovagal syncope. To help reduce the risk of fainting, you can stay away from some of these triggers such as:  Standing for long periods Excess heat Intense emotion, such as fear Intense pain The sight of blood or a needle Prolonged exercise Dehydration Skipping meals Other triggers include:  Urinating Swallowing Coughing Having a bowel movement What are the symptoms of vasovagal syncope? Fainting is the defining symptom of vasovagal syncope. Often you may have certain symptoms before actually fainting such  as:  Nausea Warmth Turning pale Getting sweaty palms Feeling dizzy or lightheaded Blurred vision If you can lie down at the first sign of these symptoms, you will often be able to prevent fainting. When it happens, this type of fainting almost always happens in a sitting or standing position. Not everyone notices symptoms before fainting, however.  When a person does faint, lying down restores blood flow to the brain. Consciousness should return fairly quickly. You might not feel normal for a little while after you faint. You might feel depressed or fatigued for a short time. Some people even feel nauseous and may vomit.  Some people have only 1 or 2 episodes of vasovagal syncope in their life. For others, the problem is more chronic and happens with no warning.  How is vasovagal syncope diagnosed? Your doctor will review your medical history and do a physical exam. This will probably include measuring the blood pressure while lying down, seated, and then standing. Your doctor will likely do an electrocardiogram (ECG) as well, to evaluate the hearts rhythm. For many children and young adults, this may be all that is needed. Usually, the doctor can safely assume that the fainting is due to vasovagal syncope, and not some form of syncope that is  more dangerous.  Sometimes the doctor needs to check for other possible causes for fainting. Because some causes of fainting are dangerous, the doctor will want to rule out these other causes. Your doctor might use tests such as the following:  Continuous portable ECG monitoring, to further analyze heart rhythms Echocardiogram, to examine blood flow in the heart and heart motion Exercise stress testing, to see how your heart works during exercise Blood work, only if your doctor is suspicious for an abnormality If these tests are normal, you might need something called a tilt table test. For this test, you lie down on a padded table. Someone measures  your heart rate and blood pressure while you are lying down and then tilted up for a period of time. Sometime medicine is also given to trigger a fainting response. If you have vasovagal syncope, you may faint during the upward tilt.  How is vasovagal syncope treated? Watch for the warning signs of vasovagal syncope, like dizziness, nausea, or sweaty palms. If you have a history of vasovagal syncope and think you are about to faint, lie down right away. Tensing your arms or crossing your legs can help prevent fainting. Passively raising or propping up your legs in the air can also help.  To immediately treat someone who has fainted from vasovagal syncope, help the person lie down and lift their legs up in the air. This will restore blood flow to the brain, and the person should quickly regain consciousness. The person should lie down for a little while afterwards.  If you have had episodes of vasovagal syncope, your doctor might make some suggestions on how to help prevent fainting. These might include:  Avoiding triggers, such as standing for a long time or the sight of blood Moderate exercise training Discontinuing medicines that lower blood pressure, like diuretics Eating a higher salt diet, to help keep up blood volume Drinking plenty of fluids, to maintain blood volume Wearing compression stockings or abdominal binders Occasionally, you may need medicine to help control vasovagal syncope. However, research on these medicines has revealed uncertain benefits in vasovagal syncope. These are usually only considered when a person has multiple episodes of fainting. Some of the medicines your doctor may advise a trial of include:  Alpha-1-adrenergic agonists, to increase blood pressure Corticosteroids, to help increase the sodium and fluid levels Serotonin reuptake inhibitors (SSRIs), to moderate the nervous system response If these medicines are ineffective, doctors sometimes try orthostatic  training. This method uses a tilt table to gradually increase the amount of time spent upright. Rarely, in cases where a significant slowing of the heartbeat or pausing is detected, a heart pacemaker is needed.  What are possible complications of vasovagal syncope? Vasovagal syncope itself is generally not dangerous. Of course, fainting can be dangerous if it happens at certain times, like while driving. Most people with rare episodes of vasovagal syncope can drive safely. If you have chronic syncope that is not under control, your doctor may advise against driving. This is especially likely if you dont usually have warning signs before you faint. Ask your doctor about what is safe for you to do.  When should I call my healthcare provider? See a doctor right away if you have recurrent episodes of passing out or other related problems.  Key points about vasovagal syncope Vasovagal syncope is the most common cause of fainting. It happens when the blood vessels open too wide or the heartbeat slows, causing a temporary lack of blood flow  to the brain. It's generally not a dangerous condition. To prevent fainting, stay out of hot places and don't stand for long periods. If you feel lightheaded, nauseous, or sweaty, lie down right away and raise your legs. Most people with occasional vasovagal syncope need to make only lifestyle changes such as drinking more fluids and eating more salt. Some people may need medicine or even a heart pacemaker. Next steps Tips to help you get the most from a visit to your healthcare provider:  Know the reason for your visit and what you want to happen. Before your visit, write down questions you want answered. Bring someone with you to help you ask questions and remember what your provider tells you. At the visit, write down the name of a new diagnosis, and any new medicines, treatments, or tests. Also write down any new instructions your provider gives you. Know why  a new medicine or treatment is prescribed, and how it will help you. Also know what the side effects are. Ask if your condition can be treated in other ways. Know why a test or procedure is recommended and what the results could mean. Know what to expect if you do not take the medicine or have the test or procedure. If you have a follow-up appointment, write down the date, time, and purpose for that visit. Know how you can contact your provider if you have questions.   Occipital Neuralgia Occipital neuralgia is a type of headache that causes brief episodes of very bad pain in the back of the head. Pain from occipital neuralgia may spread (radiate) to other parts of the head. These headaches may be caused by irritation of the nerves that leave the spinal cord high up in the neck, just below the base of the skull (occipital nerves). The occipital nerves transmit sensations from the back of the head, the top of the head, and the areas behind the ears. What are the causes? This condition can occur without any known cause (primary headache syndrome). In other cases, this condition is caused by pressure on or irritation of one of the two occipital nerves. Pressure and irritation may be due to: Muscle spasm in the neck. Neck injury. Wear and tear of the vertebrae in the neck (osteoarthritis). Disease of the disks that separate the vertebrae. Swollen blood vessels that put pressure on the occipital nerves. Infections. Tumors. Diabetes. What are the signs or symptoms? This condition causes brief burning, stabbing, electric, shocking, or shooting pain in the back of the head that can radiate to the top of the head. It can happen on one side or both sides of the head. It can also cause: Pain behind the eye. Pain triggered by neck movement or hair brushing. Scalp tenderness. Aching in the back of the head between episodes of very bad pain. Pain that gets worse with exposure to bright lights. How is  this diagnosed? Your health care provider may diagnose the condition based on a physical exam and your symptoms. Tests may be done, such as: Imaging studies of the brain and neck (cervical spine), such as an MRI or CT scan. These look for causes of pinched nerves. Applying pressure to the nerves in the neck to try to re-create the pain. Injection of numbing medicine into the occipital nerve areas to see if pain goes away (diagnostic nerve block). How is this treated? Treatment for this condition may begin with simple measures, such as: Rest. Massage. Applying heat or cold to the area.  Over-the-counter pain relievers. If these measures do not work, you may need other treatments, including: Medicines, such as: Prescription-strength anti-inflammatory medicines. Muscle relaxants. Anti-seizure medicines, which can relieve pain. Antidepressants, which can relieve pain. Injected medicines, such as medicines that numb the area (local anesthetic) and steroids. Pulsed radiofrequency ablation. This is when wires are implanted to deliver electrical impulses that block pain signals from the occipital nerve. Surgery to relieve nerve pressure. Physical therapy. Follow these instructions at home: Managing pain   Avoid any activities that cause pain. Rest when you have an attack of pain. Try gentle massage to relieve pain. Try a different pillow or sleeping position. If directed, apply heat to the affected area as often as told by your health care provider. Use the heat source that your health care provider recommends, such as a moist heat pack or a heating pad. Place a towel between your skin and the heat source. Leave the heat on for 20-30 minutes. Remove the heat if your skin turns bright red. This is especially important if you are unable to feel pain, heat, or cold. You have a greater risk of getting burned. If directed, put ice on the back of your head and neck area. To do this: Put ice in a  plastic bag. Place a towel between your skin and the bag. Leave the ice on for 20 minutes, 2-3 times a day. Remove the ice if your skin turns bright red. This is very important. If you cannot feel pain, heat, or cold, you have a greater risk of damage to the area. General instructions Take over-the-counter and prescription medicines only as told by your health care provider. Avoid things that make your symptoms worse, such as bright lights. Try to stay active. Get regular exercise that does not cause pain. Ask your health care provider to suggest safe exercises for you. Work with a physical therapist to learn stretching exercises you can do at home. Practice good posture. Keep all follow-up visits. This is important. Contact a health care provider if: Your medicine is not working. You have new or worsening symptoms. Get help right away if: You have very bad head pain that does not go away. You have a sudden change in vision, balance, or speech. These symptoms may represent a serious problem that is an emergency. Do not wait to see if the symptoms will go away. Get medical help right away. Call your local emergency services (911 in the [place] Do not drive yourself to the hospital. Summary Occipital neuralgia is a type of headache that causes brief episodes of very bad pain in the back of the head. Pain from occipital neuralgia may spread (radiate) to other parts of the head. Treatment for this condition includes rest, massage, and medicines. This information is not intended to replace advice given to you by your health care provider. Make sure you discuss any questions you have with your health care provider. Document Revised: 02/15/2020 Document Reviewed: 02/15/2020 Elsevier Patient Education  2022 [place] is a neck injury due to forceful, rapid back-and-forth movement of the neck, like the cracking of a whip.  Whiplash is commonly caused by rear-end  car accidents. But whiplash can also result from sports accidents, physical abuse and other types of traumas, such as a fall. Whiplash may be called a neck sprain or strain, but these terms also include other types of neck injuries.  Most people with whiplash get better within a few weeks by following  a treatment plan that includes pain medication and exercise. However, some people have chronic neck pain and other long-lasting complications.  Products & Services Book: [place], 5th Edition Show more products from [facility] Symptoms Signs and symptoms of whiplash usually develop within days of the injury, and may include:  Neck pain and stiffness Worsening of pain with neck movement Loss of range of motion in the neck Headaches, most often starting at the base of the skull Tenderness or pain in the shoulder, upper back or arms Tingling or numbness in the arms Fatigue Dizziness Some people also have:  Blurred vision Ringing in the ears (tinnitus) Sleep disturbances Irritability Difficulty concentrating Memory problems Depression When to see a doctor See your doctor if you have any neck pain or other whiplash symptoms after a car accident, sports injury or other traumatic injury. It's important to get a prompt and accurate diagnosis and to rule out broken bones or other damage that can cause or worsen symptoms.  Causes Whiplash typically occurs when your head is forcefully and quickly thrown backward and then forward. This motion can injure bones in the spine, disks between the bones, ligaments, muscles, nerves and other tissues of the neck.  A whiplash injury may result from:  Auto accidents. Rear-end collisions are a major cause of whiplash. Physical abuse or assault. Whiplash can occur if you are punched or shaken. It's one of the injuries seen in shaken baby syndrome. Contact sports. Football tackles and other sports-related collisions can sometimes cause  whiplash. Complications Most people who have whiplash feel better within a few weeks and don't seem to have any lasting effects from the injury. However, some people continue to have pain for several months or years after the injury occurred.  It is difficult to predict how each person with whiplash may recover. In general, you may be more likely to have chronic pain if your first symptoms were intense, started rapidly and included:  Severe neck pain More-limited range of motion Pain that spread to the arms The following risk factors have been linked to a worse outcome:  Having had whiplash before Older age Existing low back or neck pain A high-speed injury

## 2021-06-03 NOTE — Telephone Encounter (Signed)
When checking out patient asked to be scheduled for an occipital nerve block in two weeks. I let her know that I did not see anything open right now and I would have a nurse call next week to schedule.  Also, she asked for a letter from Dr. Lucia Gaskins to give to her work saying she needs to work less hours. She will be here next week on Wednesday for an EEG and can pick it up then.
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
## 2021-06-04 LAB — COMPREHENSIVE METABOLIC PANEL
ALT: 23 IU/L (ref 0–32)
AST: 22 IU/L (ref 0–40)
Albumin/Globulin Ratio: 2 (ref 1.2–2.2)
Albumin: 4.7 g/dL (ref 3.8–4.9)
Alkaline Phosphatase: 62 IU/L (ref 44–121)
BUN/Creatinine Ratio: 25 — ABNORMAL HIGH (ref 9–23)
BUN: 22 mg/dL (ref 6–24)
Bilirubin Total: 0.4 mg/dL (ref 0.0–1.2)
CO2: 26 mmol/L (ref 20–29)
Calcium: 10.4 mg/dL — ABNORMAL HIGH (ref 8.7–10.2)
Chloride: 98 mmol/L (ref 96–106)
Creatinine, Ser: 0.89 mg/dL (ref 0.57–1.00)
Globulin, Total: 2.4 g/dL (ref 1.5–4.5)
Glucose: 114 mg/dL — ABNORMAL HIGH (ref 70–99)
Potassium: 3.7 mmol/L (ref 3.5–5.2)
Sodium: 140 mmol/L (ref 134–144)
Total Protein: 7.1 g/dL (ref 6.0–8.5)
eGFR: 78 mL/min/{1.73_m2} (ref >59)

## 2021-06-04 LAB — CBC WITH DIFFERENTIAL/PLATELET
Basophils Absolute: 0.1 10*3/uL (ref 0.0–0.2)
Basos: 1 %
EOS (ABSOLUTE): 0.1 10*3/uL (ref 0.0–0.4)
Eos: 1 %
Hematocrit: 44.3 % (ref 34.0–46.6)
Hemoglobin: 15.2 g/dL (ref 11.1–15.9)
Immature Grans (Abs): 0.1 10*3/uL (ref 0.0–0.1)
Immature Granulocytes: 1 %
Lymphocytes Absolute: 3 10*3/uL (ref 0.7–3.1)
Lymphs: 37 %
MCH: 31.9 pg (ref 26.6–33.0)
MCHC: 34.3 g/dL (ref 31.5–35.7)
MCV: 93 fL (ref 79–97)
Monocytes Absolute: 0.6 10*3/uL (ref 0.1–0.9)
Monocytes: 7 %
Neutrophils Absolute: 4.3 10*3/uL (ref 1.4–7.0)
Neutrophils: 53 %
Platelets: 277 10*3/uL (ref 150–450)
RBC: 4.77 x10E6/uL (ref 3.77–5.28)
RDW: 12.1 % (ref 11.7–15.4)
WBC: 8.2 10*3/uL (ref 3.4–10.8)

## 2021-06-04 LAB — TSH: TSH: 9.68 u[IU]/mL — ABNORMAL HIGH (ref 0.450–4.500)

## 2021-06-05 ENCOUNTER — Encounter: Payer: Self-pay | Admitting: Neurology

## 2021-06-05 ENCOUNTER — Telehealth: Payer: Self-pay | Admitting: Neurology

## 2021-06-05 DIAGNOSIS — S0990XA Unspecified injury of head, initial encounter: Secondary | ICD-10-CM | POA: Insufficient documentation

## 2021-06-05 DIAGNOSIS — R4189 Other symptoms and signs involving cognitive functions and awareness: Secondary | ICD-10-CM | POA: Insufficient documentation

## 2021-06-05 DIAGNOSIS — R55 Syncope and collapse: Secondary | ICD-10-CM | POA: Insufficient documentation

## 2021-06-05 DIAGNOSIS — F0781 Postconcussional syndrome: Secondary | ICD-10-CM | POA: Insufficient documentation

## 2021-06-05 NOTE — Telephone Encounter (Signed)
Sharon Friedman, referring PA, is not her primary care. She will have to see her primary care (or find one) because her thyroid value is very abnormal. She needs to be evaluated and treated for hypothyroidism.  Her TSH is 9.680 which is very abnormal. Please let patient know. If she has a primary care, find out who it is so we can forward these results to pcp thanks.

## 2021-06-06 ENCOUNTER — Telehealth: Payer: Self-pay | Admitting: Neurology

## 2021-06-06 ENCOUNTER — Ambulatory Visit: Payer: 59 | Admitting: Physical Therapy

## 2021-06-06 NOTE — Telephone Encounter (Signed)
I got the pt scheduled for a nerve block with Dr Lucia Gaskins this Wednesday 2/8 @ 3:30 PM. She has an EEG at 2 pm prior to the nerve block.

## 2021-06-06 NOTE — Telephone Encounter (Signed)
Noted. I spoke with the pt and was able to move her nerve block to another day, tomorrow feb 7th at 330 pm.

## 2021-06-06 NOTE — Telephone Encounter (Signed)
Spoke with the patient and discussed her results which show a TSH of 9.680 which is very abnormal, more than double the upper limit.  She will be seeing Dr. Duanne Guess (primary care) as a new patient.  She was scheduled for today but I spoke with Dr. Chauncy Passy office to get the fax number and they had to reschedule her appointment since the provider was out of the office.  The appointment will be next Tuesday, February 14 at 1:20 PM.  I was provided with a fax number of [phone]. Labs faxed to Dr Duanne Guess. Received a receipt of confirmation.

## 2021-06-06 NOTE — Telephone Encounter (Signed)
[employer] pending faxed notes  °

## 2021-06-07 ENCOUNTER — Ambulatory Visit (INDEPENDENT_AMBULATORY_CARE_PROVIDER_SITE_OTHER): Payer: 59 | Admitting: Neurology

## 2021-06-07 DIAGNOSIS — R4189 Other symptoms and signs involving cognitive functions and awareness: Secondary | ICD-10-CM | POA: Diagnosis not present

## 2021-06-07 DIAGNOSIS — S060X9A Concussion with loss of consciousness of unspecified duration, initial encounter: Secondary | ICD-10-CM

## 2021-06-07 DIAGNOSIS — F0781 Postconcussional syndrome: Secondary | ICD-10-CM | POA: Diagnosis not present

## 2021-06-07 DIAGNOSIS — M5481 Occipital neuralgia: Secondary | ICD-10-CM | POA: Diagnosis not present

## 2021-06-07 NOTE — Progress Notes (Signed)
Nerve block w/o steroid: Pt signed consent  0.5% Bupivocaine 12 mL LOT: [id number] EXP: 01/26 NDC: 63323-467-01   
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
## 2021-06-08 ENCOUNTER — Ambulatory Visit (INDEPENDENT_AMBULATORY_CARE_PROVIDER_SITE_OTHER): Payer: 59 | Admitting: Neurology

## 2021-06-08 ENCOUNTER — Ambulatory Visit: Payer: 59 | Admitting: Neurology

## 2021-06-08 DIAGNOSIS — R55 Syncope and collapse: Secondary | ICD-10-CM

## 2021-06-08 DIAGNOSIS — R404 Transient alteration of awareness: Secondary | ICD-10-CM

## 2021-06-09 ENCOUNTER — Encounter: Payer: Self-pay | Admitting: Neurology

## 2021-06-09 ENCOUNTER — Ambulatory Visit (INDEPENDENT_AMBULATORY_CARE_PROVIDER_SITE_OTHER): Payer: 59 | Admitting: Orthopaedic Surgery

## 2021-06-09 ENCOUNTER — Encounter: Payer: Self-pay | Admitting: Orthopaedic Surgery

## 2021-06-09 ENCOUNTER — Other Ambulatory Visit: Payer: Self-pay

## 2021-06-09 DIAGNOSIS — M542 Cervicalgia: Secondary | ICD-10-CM | POA: Insufficient documentation

## 2021-06-09 NOTE — Telephone Encounter (Signed)
MR Brain w/wo contrast Dr. Lucia Gaskins [employer] auth: [id number] (exp. 06/06/21 to 09/03/21). Patient is scheduled at [facility] for 06/14/21.

## 2021-06-09 NOTE — Procedures (Signed)
° ° °  History: 78 with history of concussion and black outs   EEG classification:  Awake and asleep  Description of the recording: The background rhythms of this recording consists of a fairly well modulated medium amplitude background activity of 9-10 Hz. As the record progresses, the patient initially is in the waking state, but appears to enter the early stage II sleep during the recording, with rudimentary sleep spindles and vertex sharp wave activity seen. During the wakeful state, photic stimulation is performed, and no abnormal responses were seen. Hyperventilation was also performed, shows intermittent left frontotemporal focal slowing. No epileptiform discharges seen during this recording. There was intermittent left frontotemporal focal slowing. EKG monitor shows a heart rate of 84.  Impression: This is an abnormal EEG recording in the waking and sleeping state due to presence of intermittent left frontotemporal focal slowing. Focal slowing is consistent with an area of neuronal dysfunction in the left frontotemporal region.    Windell Norfolk, MD [facility]
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
## 2021-06-10 ENCOUNTER — Ambulatory Visit: Payer: 59 | Attending: Neurology

## 2021-06-10 DIAGNOSIS — M6281 Muscle weakness (generalized): Secondary | ICD-10-CM | POA: Diagnosis present

## 2021-06-10 DIAGNOSIS — R293 Abnormal posture: Secondary | ICD-10-CM | POA: Insufficient documentation

## 2021-06-10 DIAGNOSIS — R4701 Aphasia: Secondary | ICD-10-CM | POA: Diagnosis present

## 2021-06-10 DIAGNOSIS — M79602 Pain in left arm: Secondary | ICD-10-CM | POA: Insufficient documentation

## 2021-06-10 DIAGNOSIS — S0990XA Unspecified injury of head, initial encounter: Secondary | ICD-10-CM | POA: Insufficient documentation

## 2021-06-10 DIAGNOSIS — R5383 Other fatigue: Secondary | ICD-10-CM | POA: Diagnosis not present

## 2021-06-10 DIAGNOSIS — S134XXA Sprain of ligaments of cervical spine, initial encounter: Secondary | ICD-10-CM | POA: Insufficient documentation

## 2021-06-10 DIAGNOSIS — R42 Dizziness and giddiness: Secondary | ICD-10-CM | POA: Diagnosis present

## 2021-06-10 DIAGNOSIS — R41841 Cognitive communication deficit: Secondary | ICD-10-CM | POA: Insufficient documentation

## 2021-06-10 DIAGNOSIS — F0781 Postconcussional syndrome: Secondary | ICD-10-CM | POA: Diagnosis not present

## 2021-06-10 DIAGNOSIS — M5481 Occipital neuralgia: Secondary | ICD-10-CM | POA: Diagnosis not present
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
## 2021-06-13 ENCOUNTER — Ambulatory Visit: Payer: 59

## 2021-06-13 ENCOUNTER — Other Ambulatory Visit: Payer: Self-pay

## 2021-06-13 DIAGNOSIS — R41841 Cognitive communication deficit: Secondary | ICD-10-CM

## 2021-06-13 DIAGNOSIS — R4701 Aphasia: Secondary | ICD-10-CM

## 2021-06-13 DIAGNOSIS — R293 Abnormal posture: Secondary | ICD-10-CM | POA: Diagnosis not present
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
## 2021-06-14 ENCOUNTER — Ambulatory Visit (INDEPENDENT_AMBULATORY_CARE_PROVIDER_SITE_OTHER): Payer: 59

## 2021-06-14 DIAGNOSIS — F0781 Postconcussional syndrome: Secondary | ICD-10-CM

## 2021-06-14 DIAGNOSIS — S0990XA Unspecified injury of head, initial encounter: Secondary | ICD-10-CM

## 2021-06-14 DIAGNOSIS — R55 Syncope and collapse: Secondary | ICD-10-CM | POA: Diagnosis not present

## 2021-06-14 DIAGNOSIS — R404 Transient alteration of awareness: Secondary | ICD-10-CM | POA: Diagnosis not present

## 2021-06-14 DIAGNOSIS — R4189 Other symptoms and signs involving cognitive functions and awareness: Secondary | ICD-10-CM

## 2021-06-14 DIAGNOSIS — S060X9A Concussion with loss of consciousness of unspecified duration, initial encounter: Secondary | ICD-10-CM

## 2021-06-14 MED ORDER — GADOBENATE DIMEGLUMINE 529 MG/ML IV SOLN
10.0000 mL | Freq: Once | INTRAVENOUS | Status: AC | PRN
  Administered 2021-06-14: 10 mL via INTRAVENOUS

## 2021-06-16 ENCOUNTER — Other Ambulatory Visit: Payer: Self-pay

## 2021-06-16 ENCOUNTER — Encounter: Payer: Self-pay | Admitting: Speech Pathology

## 2021-06-16 ENCOUNTER — Ambulatory Visit: Payer: 59

## 2021-06-16 ENCOUNTER — Ambulatory Visit: Payer: 59 | Admitting: Speech Pathology

## 2021-06-16 DIAGNOSIS — R42 Dizziness and giddiness: Secondary | ICD-10-CM

## 2021-06-16 DIAGNOSIS — R293 Abnormal posture: Secondary | ICD-10-CM | POA: Diagnosis not present

## 2021-06-16 DIAGNOSIS — M6281 Muscle weakness (generalized): Secondary | ICD-10-CM

## 2021-06-16 DIAGNOSIS — R41841 Cognitive communication deficit: Secondary | ICD-10-CM

## 2021-06-16 DIAGNOSIS — M79602 Pain in left arm: Secondary | ICD-10-CM

## 2021-06-16 NOTE — Therapy (Signed)
[facility] [address] [place], [place], [address] Phone: [phone]   Fax:  [phone]  Speech Language Pathology Treatment  Patient Details  Name: Sharon Friedman MRN: [id number] Date of Birth: 05-Nov-1970 Referring Provider (SLP): Melvenia Beam, MD   Encounter Date: 06/16/2021   End of Session - 06/16/21 1334     Visit Number 2    Number of Visits 17    Date for SLP Re-Evaluation 08/12/21   written for 8 weeks due to scheduling   Authorization Type [employer], $70 Copay, VL: 30 (combined) auth req'd for addl visits    SLP Start Time 0930    SLP Stop Time  1015    SLP Time Calculation (min) 45 min    Activity Tolerance Patient tolerated treatment well             Past Medical History:  Diagnosis Date   Hypertension    Migraines     Past Surgical History:  Procedure Laterality Date   APPENDECTOMY     BREAST ENHANCEMENT SURGERY     DILATION AND CURETTAGE OF UTERUS     EYE SURGERY     lifted cornea (as child done for astigmatism)    There were no vitals filed for this visit.   Subjective Assessment - 06/16/21 0936     Subjective "Having pain just exhausts me"    Currently in Pain? Yes    Pain Score 5     Pain Location Head    Pain Orientation Left;Anterior    Pain Descriptors / Indicators Headache;Constant;Shooting    Pain Type Chronic pain    Pain Onset More than a month ago                   ADULT SLP TREATMENT - 06/16/21 0939       General Information   Behavior/Cognition Alert;Cooperative;Pleasant mood      Treatment Provided   Treatment provided Cognitive-Linquistic      Cognitive-Linquistic Treatment   Treatment focused on Cognition;Patient/family/caregiver education    Skilled Treatment Completed CLQT today - see clinical impressions. Targeted compensations for attention, headache management and sensory overstimulation. Generated strategies of setting timer,  working or reading for 30 minutes, then taking a break in quiet dark place for 5-10 minutes. Instructed Yanet to set top 3-4 priorities each day to avoid getting overwhelmed - write to do list and cross off. Re-inforced Dr. Cathren Laine  instructions to be aware of body sensations and stop and break before making h/a worse or passing out      Assessment / Recommendations / [place] with current plan of care      Progression Toward Goals   Progression toward goals Progressing toward goals              SLP Education - 06/16/21 1328     Education Details compensaitons for attention, memory, sensory over stim    Person(s) Educated Patient    Methods Explanation;Demonstration;Verbal cues;Handout    Comprehension Verbalized understanding;Verbal cues required;Need further instruction              SLP Short Term Goals - 06/16/21 1333       SLP SHORT TERM GOAL #1   Title Pt will utilize external/internal memory compensations to aid daily functioning given occasional min A over 2 sessions    Time 4    Period Weeks    Status On-going  SLP SHORT TERM GOAL #2   Title Pt will maintain sustained and focused attention during 15+ minute conversation with used of targeted strategies given occasional A over 2 sessions    Time 4    Period Weeks    Status On-going      SLP SHORT TERM GOAL #3   Title Pt will demonstrate ability to alternate attention successfully between conversation/structured tasks given occasional min A over 2 sessions    Time 4    Period Weeks    Status On-going      SLP SHORT TERM GOAL #4   Title Pt will utilize anomia compensations in 30+ minute conversations given occasional min A over 2 sessions    Time 4    Period Weeks    Status On-going              SLP Long Term Goals - 06/16/21 1333       SLP LONG TERM GOAL #1   Title Pt will utilize external/internal memory compensations to aid daily functioning given rare min A over 2 sessions     Time 8    Period Weeks    Status On-going      SLP LONG TERM GOAL #2   Title Pt will maintain sustained and focused attention during 30+ minute conversations given rare min A over 2 sessions    Time 8    Period Weeks    Status On-going      SLP LONG TERM GOAL #3   Title Pt will accurately manage medications with no missed doses reported > 2 weeks given rare min A from family    Time 8    Period Weeks    Status On-going      SLP LONG TERM GOAL #4   Title Pt will utilize anomia compensations in work presentations/business calls given rare min A over 2 sessions    Time 8    Period Weeks    Status On-going      SLP LONG TERM GOAL #5   Title Pt will report improved cognitive linguistic functioning via PROM by 5 points at last ST session    Baseline Cognitive Function: 70    Time 8    Period Weeks    Status On-going              Plan - 06/16/21 1328     Clinical Impression Statement Moderate cognitive communication impairments persist. Initiated trianing on compensations for attention, memory, sensory over stimulation. Completed CLQT: Attention - moderate impiarment; memory - moderate impairment; Executive Function - mild impairment; Language - low WNL; Visuospatial skills - severe impairment. On Maze subtest, Joycelin used strategy of covering quadrants of the maze with her hand and stated Advertising account planner, Symbol Trails and Maze tests made her dizzy. On Symbol Cancelation, she verbalized the difference between the foil and correct symbol "one has sharp points" but continued to cross out both the correct and foil symbol.I provided reading highlighter and instructed her to read with this or index card to help ease of staying on the correct line. Continue skilled ST to return to PLOF, maximize professional success and QOL             Patient will benefit from skilled therapeutic intervention in order to improve the following deficits and impairments:   Cognitive communication  deficit    Problem List Patient Active Problem List   Diagnosis Date Noted   Neck pain 06/09/2021   Post concussion syndrome  06/05/2021   Syncope 06/05/2021   Persistent cognitive impairment 06/05/2021   Head trauma 06/05/2021   Elevated troponin    LBBB (left bundle branch block)    Acute left-sided weakness 03/17/2020   Occipital headache 06/11/2018   left sided temporal headache 06/11/2018   Occipital neuralgia 06/11/2018   Vitiligo 11/14/2016   Hypertension 07/13/2014   Migraine 07/13/2014    Halo Laski, Annye Rusk, CCC-SLP 06/16/2021, 1:35 PM  [facility] [address] [place], [place], [address] Phone: [phone]   Fax:  [phone]   Name: NELLINE BURNINGHAM MRN: [id number] Date of Birth: April 11, 1971

## 2021-06-16 NOTE — Patient Instructions (Signed)
° ° °  Take 5 to 10 minute break in a quiet dark room to let  your brain reset (no TV, music, phone etc)  Notice when headache is better or worse - what are some triggers? Can you avoid a worse headache if you take a quick rest when you notice brain fog or difficulty focusing?  Practice mindfulness - Breathe2Relax app   Set your priorities for each day - top 3  Work for 30 minutes (use a timer) same for reading -  Keep up noticing your body sensations and listen to your body   Brain injury is invisible - people will not realize why you are having trouble - you may even have to remind family about what  you are dealing with because you look great on the outside  Be aware of how you are functioning in bright, noisy places such as restaurants, grocery stores,  Start building up tolerance and endurance a little at a time

## 2021-06-16 NOTE — Therapy (Signed)
[facility] [address] [place], [place], [address] Phone: [phone]   Fax:  [phone]  Physical Therapy Treatment  Patient Details  Name: Sharon Friedman MRN: [id number] Date of Birth: April 14, 1971 Referring Provider (PT): Naomie Dean, MD   Encounter Date: 06/16/2021   PT End of Session - 06/16/21 1059     Visit Number 2    Number of Visits 13    Date for PT Re-Evaluation 07/22/21    Authorization Type [employer], $70 Copay, VL: 30 (combined) auth reqd for addl visits    PT Start Time 1018   finishing up with speech therapy   PT Stop Time 1058    PT Time Calculation (min) 40 min    Activity Tolerance Patient limited by pain    Behavior During Therapy [facility] for tasks assessed/performed             Past Medical History:  Diagnosis Date   Hypertension    Migraines     Past Surgical History:  Procedure Laterality Date   APPENDECTOMY     BREAST ENHANCEMENT SURGERY     DILATION AND CURETTAGE OF UTERUS     EYE SURGERY     lifted cornea (as child done for astigmatism)    There were no vitals filed for this visit.   Subjective Assessment - 06/16/21 1019     Subjective Patient has started speech therapy, reports it was initially discouraging but feels like its only going to make improvements from here. It is taking 30 minutes to read a page of a book, due to dizziness and HA. Reports 3/10 dizziness at baseline today.    Pertinent History Migraines, HTN    Diagnostic tests MRI scan of the cervical spine without any evidence of nerve root compression, spinal stenosis, fractures or significant arthritis. MRI on the Brain scheduled for Tuesday.    Patient Stated Goals Improve Pain Management; Stop the Falls    Currently in Pain? Yes    Pain Score 5     Pain Location Head    Pain Orientation Left;Anterior;Posterior    Pain Descriptors / Indicators Headache;Constant   Sensitive   Pain Type Chronic pain     Pain Onset More than a month ago    Pain Frequency Constant               Vestibular Assessment - 06/16/21 0001       Symptom Behavior   Type of Dizziness  Imbalance;Unsteady with head/body turns;Lightheadedness;Diplopia    Frequency of Dizziness daily with specific movements    Duration of Dizziness constant; sometimes in can last minutes to hours    Symptom Nature Motion provoked;Intermittent    Aggravating Factors Looking up to the ceiling;Turning head quickly;Turning body quickly;Walking in a crowd;Activity in general   dizziness with prolonged standing   Relieving Factors Slow movements;Closing eyes;Rest;Avoiding busy/distracting areas    Progression of Symptoms Worse    History of similar episodes None      Oculomotor Exam   Oculomotor Alignment Normal    Spontaneous Absent   reports increased dizziness (mod dizziness)   Gaze-induced  Absent   reports increased dizziness (mod dizziness)   Smooth Pursuits Comment   unable to maintain eyes focused; frequent blinking; able to track peripherally, difficulty wiht more central.   Saccades Slow;Undershoots   very slow movements   Comment intermittent rest breaks due to symptoms      Vestibulo-Ocular Reflex   VOR 1 Head  Only (x 1 viewing) very SLOW VOR. PT unable to complete passive due to sensitivity on L side of head. Frequent blinnking and difficulty maintain eyes focused on target    VOR Cancellation Unable to maintain gaze   frequent closing of eyes; nausea/mod dizziness     Positional Testing   Dix-Hallpike Dix-Hallpike Right;Dix-Hallpike Left    Horizontal Canal Testing Horizontal Canal Right;Horizontal Canal Left      Dix-Hallpike Right   Dix-Hallpike Right Duration 0    Dix-Hallpike Right Symptoms No nystagmus      Dix-Hallpike Left   Dix-Hallpike Left Duration 0    Dix-Hallpike Left Symptoms No nystagmus      Horizontal Canal Right   Horizontal Canal Right Duration 0    Horizontal Canal Right Symptoms  Normal      Horizontal Canal Left   Horizontal Canal Left Duration 0    Horizontal Canal Left Symptoms Normal      Positional Sensitivities   Sit to Supine No dizziness    Supine to Left Side No dizziness   symptoms with return to upright; closes eyes to diminish symptoms.   Supine to Right Side No dizziness   symptoms with return to upright; closes eyes to diminish symptoms.   Supine to Sitting Lightheadedness    Right Hallpike No dizziness    Up from Right Hallpike Lightheadedness    Up from Left Hallpike Lightheadedness    Nose to Right Knee No dizziness    Right Knee to Sitting No dizziness    Nose to Left Knee No dizziness    Left Knee to Sitting No dizziness    Head Turning x 5 Moderate dizziness   only able to complete x 3 reps with EO; reports nausea/HA/mod dizziness.   Head Nodding x 5 Moderate dizziness   mod dizziness; one instance of losing balance posterior requiring UE support; nausea   Rolling Right No dizziness    Rolling Left No dizziness                [facility] Adult PT Treatment/Exercise - 06/16/21 0001       Therapeutic Activites    Therapeutic Activities Other Therapeutic Activities    Other Therapeutic Activities trialed visual tracking/smooth pursuit activity sheet x 2 lines. Mild Dizziness. Provided handout sheets for completion at home for HEP. Will review at next session.                     PT Education - 06/16/21 1059     Education Details HEP (Visual tracking sheet)    Person(s) Educated Patient    Methods Explanation;Demonstration;Handout    Comprehension Verbalized understanding;Returned demonstration              PT Short Term Goals - 06/10/21 1026       PT SHORT TERM GOAL #1   Title Patient will be indepdent with inital HEP (All STGs Due: 07/08/21)    Baseline to be established    Time 3    Period Weeks    Status New    Target Date 07/08/21      PT SHORT TERM GOAL #2   Title Vestibular Asessment TBA and LTG to be  set as applicable    Baseline TBA    Time 3    Period Weeks    Status New      PT SHORT TERM GOAL #3   Title Pt will report 25% improvement in L shoulder/arm pain with functional activities  Baseline significant pain limiting activities    Time 3    Period Weeks    Status New               PT Long Term Goals - 06/10/21 1029       PT LONG TERM GOAL #1   Title Pt will be independent with final/progressive HEP (ALL LTGs Due: 08/05/21)    Baseline no HEP established    Time 6    Period Weeks    Status New    Target Date 08/05/21      PT LONG TERM GOAL #2   Title Pt will improve all cervical AROM by 5 deg and report </= 2/10 pain with neck movement    Baseline see flowsheet    Time 6    Period Weeks    Status New      PT LONG TERM GOAL #3   Title Pt will report return to cycling/exercise program and report ability to complete >/= 15 minutes without increase in symptoms    Baseline not currently exercising    Time 6    Period Weeks    Status New      PT LONG TERM GOAL #4   Title LTG to be set for Vestibular Assesment                   Plan - 06/16/21 1100     Clinical Impression Statement Today's skilled PT session included further vestibular assesment. Patient demonstrating anbormal oculomotor exam, difficulty with slow VOR, mod dizziness and nausea, increased motion sensitivity. Negative positional testing noted. Established initial HEP focused on smooth pursuit activity sheet, will follow up at next session. Will continue per POC.    Personal Factors and Comorbidities Comorbidity 2    Comorbidities Migraines, HTN    Examination-Activity Limitations Carry;Lift    Examination-Participation Restrictions Occupation;Community Activity;Cleaning    Stability/Clinical Decision Making Stable/Uncomplicated    Rehab Potential Good    PT Frequency 2x / week    PT Duration 6 weeks    PT Treatment/Interventions ADLs/Self Care Home Management;Aquatic  Therapy;Canalith Repostioning;Moist Heat;Cryotherapy;Therapeutic activities;Functional mobility training;Electrical Stimulation;Iontophoresis 4mg /ml Dexamethasone;Stair training;Gait training;Therapeutic exercise;Balance training;Neuromuscular re-education;Patient/family education;Manual techniques;Taping;Dry needling;Vestibular;Passive range of motion;Joint Manipulations;Spinal Manipulations    PT Next Visit Plan Completed Vestibular Assesment. Establish initial stretching HEP for cervical/L shoulder.    Consulted and Agree with Plan of Care Patient             Patient will benefit from skilled therapeutic intervention in order to improve the following deficits and impairments:  Dizziness, Decreased range of motion, Decreased activity tolerance, Decreased strength, Impaired flexibility, Postural dysfunction, Pain, Impaired sensation, Increased muscle spasms  Visit Diagnosis: Abnormal posture  Dizziness and giddiness  Muscle weakness (generalized)  Pain in left arm     Problem List Patient Active Problem List   Diagnosis Date Noted   Neck pain 06/09/2021   Post concussion syndrome 06/05/2021   Syncope 06/05/2021   Persistent cognitive impairment 06/05/2021   Head trauma 06/05/2021   Elevated troponin    LBBB (left bundle branch block)    Acute left-sided weakness 03/17/2020   Occipital headache 06/11/2018   left sided temporal headache 06/11/2018   Occipital neuralgia 06/11/2018   Vitiligo 11/14/2016   Hypertension 07/13/2014   Migraine 07/13/2014    Tempie DonningKaitlyn B Menaal Russum, PT, DPT 06/16/2021, 11:02 AM  [facility] [address] [place], [place], [address] Phone: [phone]   Fax:  [phone]  Name: Sharon Friedman MRN: [id number] Date of Birth: 18-Aug-1970

## 2021-06-19 ENCOUNTER — Encounter: Payer: Self-pay | Admitting: Neurology

## 2021-06-20 ENCOUNTER — Ambulatory Visit: Payer: 59

## 2021-06-20 NOTE — Telephone Encounter (Signed)
48 hour extended EEG order form signed by Dr Lucia Gaskins then faxed to [facility]. Received a receipt of confirmation. Dr Teresa Coombs to read.

## 2021-06-20 NOTE — Telephone Encounter (Signed)
Spoke with patient. She is aware of the EEG results and that EKG was a little bit abnormal. Pt states her PCP is Dorinda Hill MD w/ [facility]. Will send results there. She is amenable to a 48 hour EEG. Pt aware extended EEG will be ordered with [facility] and they will call her to schedule when ready. Insurance auth needed first. She verbalized appreciation and her questions were answered.   EEG info sheet mailed to pt. Order form completed and is pending MD signature. Also printed office note, EEG results, insurance/demographic info for [facility]. EEG results forwarded to Dr Nadene Rubins.   -------------- Anson Fret, MD  [facility] Call[facility] Can you make sure we let patient know that her ekg during the eeg looked a bit abnormal, I would recommend an EKG with her primary care physician. We can forward the EEG report to them with a noteon this thanks.   Anson Fret, MD  [facility] Call[facility] Can you fill out the forms for an extended EEG for patient please? 48 hours should be sufficient. Use the company where Dr. Teresa Coombs reads the EEGs please. thanks

## 2021-06-23 ENCOUNTER — Other Ambulatory Visit: Payer: Self-pay

## 2021-06-23 ENCOUNTER — Ambulatory Visit: Payer: 59 | Admitting: Speech Pathology

## 2021-06-23 DIAGNOSIS — R293 Abnormal posture: Secondary | ICD-10-CM | POA: Diagnosis not present

## 2021-06-23 DIAGNOSIS — R41841 Cognitive communication deficit: Secondary | ICD-10-CM

## 2021-06-23 NOTE — Patient Instructions (Addendum)
Complete Concussions on IG  Great job managing stimuli to reduce nausea and head pain-- limiting time reading, using audio books, and limiting visual field to reduce content eyes need to focus on, reducing external distractions   Remember to give yourself grace-- your brain is healing and needs time and rest in order to recover.   Strategies for stress reduction: Meditation / Mindfulness  Using a key word to bring yourself back to the moment, letting your distraction go (talked about word "peace" in session) Delegation of responsibilities Continue implementing alternative strategies to get your work done (e.g. having team members narrate to you) Breath work-- breath in through your nose, out through your mouth while focusing on breath  Strategies for comprehension in conversations: Preface conversations with "I can give you 10 minutes" Ensure you're attending to conversation partner attention is the gateway to memory! Repeat back what conversational partner said to ensure you've understood their message Set up your environment for successful conversations:  Limit external distraction (no tv or music) Do not multitask (for example, make son's snack before he comes home) Ask for people to summarize what they shared with you in writing "thank you for all that information, could you email that to me?" Especially when it's important information  Attention versus Memory Attention is the gateway to memory When you "forget" something, think about what was going on when you did what you forgot. Were you attending to what was going on or were you distracted. Consider keeping a journal of when these occurences happen then we can talk about what is going on next session.   Use timer to support time management

## 2021-06-23 NOTE — Therapy (Signed)
[facility] [address] [place], [place], [address] Phone: [phone]   Fax:  [phone]  Speech Language Pathology Treatment  Patient Details  Name: Sharon Friedman MRN: [id number] Date of Birth: [birth year] Referring Provider (SLP): Anson Fret, MD   Encounter Date: 06/23/2021   End of Session - 06/23/21 1016     Visit Number 3    Number of Visits 17    Date for SLP Re-Evaluation 08/12/21   written for 8 weeks due to scheduling   Authorization Type [employer], $70 Copay, VL: 30 (combined) auth req'd for addl visits    SLP Start Time 0930    SLP Stop Time  1015    SLP Time Calculation (min) 45 min    Activity Tolerance Patient tolerated treatment well             Past Medical History:  Diagnosis Date   Hypertension    Migraines     Past Surgical History:  Procedure Laterality Date   APPENDECTOMY     BREAST ENHANCEMENT SURGERY     DILATION AND CURETTAGE OF UTERUS     EYE SURGERY     lifted cornea (as child done for astigmatism)    There were no vitals filed for this visit.   Subjective Assessment - 06/23/21 0934     Subjective "feeling good"    Currently in Pain? Yes    Pain Score 5     Pain Location Head    Pain Orientation Left;Posterior;Anterior    Pain Descriptors / Indicators Headache;Constant    Pain Type Chronic pain                   ADULT SLP TREATMENT - 06/23/21 0934       General Information   Behavior/Cognition Alert;Cooperative;Pleasant mood      Treatment Provided   Treatment provided Cognitive-Linquistic      Cognitive-Linquistic Treatment   Treatment focused on Cognition;Patient/family/caregiver education    Skilled Treatment Pt rependorses implementing strategies to adjust to cognitive changes since concussion. Using audiobooks to adjust for eye pain related to reading, reducing time of reading to ~10 minutes, limiting visual field when reading Pt  tells ST she has to replay parts of podcasts x3 in order to comprehend information. ST targets strategies and compensations for auditory comprehension -- reduce external distractions, limit multitasking, set time limits for conversations, ask for written information, repeat back what you've heard to ensure comprehension. Reviewed compensations for attention.      Assessment / Recommendations / Plan   Plan Continue with current plan of care      Progression Toward Goals   Progression toward goals Progressing toward goals              SLP Education - 06/23/21 1010     Education Details strategies for comprehension in conversations              SLP Short Term Goals - 06/23/21 1015       SLP SHORT TERM GOAL #1   Title Pt will utilize external/internal memory compensations to aid daily functioning given occasional min A over 2 sessions    Time 3    Period Weeks    Status On-going      SLP SHORT TERM GOAL #2   Title Pt will maintain sustained and focused attention during 15+ minute conversation with used of targeted strategies given occasional A over 2 sessions    Time  3    Period Weeks    Status On-going      SLP SHORT TERM GOAL #3   Title Pt will demonstrate ability to alternate attention successfully between conversation/structured tasks given occasional min A over 2 sessions    Time 3    Period Weeks    Status On-going      SLP SHORT TERM GOAL #4   Title Pt will utilize anomia compensations in 30+ minute conversations given occasional min A over 2 sessions    Time 3    Period Weeks    Status On-going              SLP Long Term Goals - 06/23/21 1016       SLP LONG TERM GOAL #1   Title Pt will utilize external/internal memory compensations to aid daily functioning given rare min A over 2 sessions    Time 7    Period Weeks    Status On-going      SLP LONG TERM GOAL #2   Title Pt will maintain sustained and focused attention during 30+ minute conversations  given rare min A over 2 sessions    Time 7    Period Weeks    Status On-going      SLP LONG TERM GOAL #3   Title Pt will accurately manage medications with no missed doses reported > 2 weeks given rare min A from family    Time 7    Period Weeks    Status On-going      SLP LONG TERM GOAL #4   Title Pt will utilize anomia compensations in work presentations/business calls given rare min A over 2 sessions    Time 7    Period Weeks    Status On-going      SLP LONG TERM GOAL #5   Title Pt will report improved cognitive linguistic functioning via PROM by 5 points at last ST session    Baseline Cognitive Function: 70    Time 7    Period Weeks    Status On-going              Plan - 06/23/21 1012     Clinical Impression Statement Moderate cognitive communication impairments persist. Initiated trianing on compensations for attention, memory, sensory over stimulation. Completed CLQT: Attention - moderate impiarment; memory - moderate impairment; Executive Function - mild impairment; Language - low WNL; Visuospatial skills - severe impairment. On Maze subtest, Johari used strategy of covering quadrants of the maze with her hand and stated Orthoptist, Symbol Trails and Maze tests made her dizzy. On Symbol Cancelation, she verbalized the difference between the foil and correct symbol "one has sharp points" but continued to cross out both the correct and foil symbol.I provided reading highlighter and instructed her to read with this or index card to help ease of staying on the correct line. Continue skilled ST to return to PLOF, maximize professional success and QOL    Speech Therapy Frequency 2x / week    Duration 8 weeks    Treatment/Interventions Compensatory strategies;Functional tasks;Cueing hierarchy;Environmental controls;Cognitive reorganization;Multimodal communcation approach;Language facilitation;Compensatory techniques;Internal/external aids;SLP instruction and feedback     Potential to Achieve Goals Good    Consulted and Agree with Plan of Care Patient             Patient will benefit from skilled therapeutic intervention in order to improve the following deficits and impairments:   Cognitive communication deficit    Problem List Patient  Active Problem List   Diagnosis Date Noted   Neck pain 06/09/2021   Post concussion syndrome 06/05/2021   Syncope 06/05/2021   Persistent cognitive impairment 06/05/2021   Head trauma 06/05/2021   Elevated troponin    LBBB (left bundle branch block)    Acute left-sided weakness 03/17/2020   Occipital headache 06/11/2018   left sided temporal headache 06/11/2018   Occipital neuralgia 06/11/2018   Vitiligo 11/14/2016   Hypertension 07/13/2014   Migraine 07/13/2014    Ramina Hulet, Radene Journey, CCC-SLP 06/23/2021, 10:19 AM  [facility] [address] [place], [place], [address] Phone: [phone]   Fax:  [phone]   Name: Sharon Friedman MRN: [id number] Date of Birth: [birth year]

## 2021-06-27 ENCOUNTER — Other Ambulatory Visit: Payer: Self-pay

## 2021-06-27 ENCOUNTER — Ambulatory Visit: Payer: 59 | Admitting: Physical Therapy

## 2021-06-27 ENCOUNTER — Ambulatory Visit: Payer: 59

## 2021-06-27 DIAGNOSIS — R293 Abnormal posture: Secondary | ICD-10-CM | POA: Diagnosis not present

## 2021-06-27 DIAGNOSIS — R42 Dizziness and giddiness: Secondary | ICD-10-CM

## 2021-06-27 DIAGNOSIS — R41841 Cognitive communication deficit: Secondary | ICD-10-CM

## 2021-06-27 NOTE — Patient Instructions (Addendum)
Memory Strategies: Let's focus on writing down information to aid your memory  A.Before you begin a conversation, write down a few key points (1-3 things) beforehand. Keep this checklist in front of you as you speak. Check off the points discussed as you go along.  B. Take notes as you listen to podcast, show, conversation - it is okay to pause/ask for extra time as you do this  C. Keep a memory notebook - daily journaling  2. Write down any words you don't recognize while you're reading - you can look them up after if you are still understanding what the book is saying   3. Attention and memory go together  A. No multitasking - focus on one task at a time  B. Be intentional when you focus. Limit the amount of time you are focusing.    4. Remember: "You are your own project" A. Rest breaks are so essential - listen to your body  B. Focus on the positives

## 2021-06-27 NOTE — Therapy (Signed)
[facility] [address] [place], [place], [address] Phone: [phone]   Fax:  [phone]  Physical Therapy Treatment  Patient Details  Name: Sharon Friedman MRN: [id number] Date of Birth: October 30, 1970 Referring Provider (PT): Naomie Dean, MD   Encounter Date: 06/27/2021   PT End of Session - 06/27/21 1913     Visit Number 3    Number of Visits 13    Date for PT Re-Evaluation 07/22/21    Authorization Type [employer], $70 Copay, VL: 30 (combined) auth reqd for addl visits    PT Start Time 0900   pt arrived 84" late for appt   PT Stop Time 0929    PT Time Calculation (min) 29 min    Activity Tolerance Patient tolerated treatment well    Behavior During Therapy [facility] for tasks assessed/performed             Past Medical History:  Diagnosis Date   Hypertension    Migraines     Past Surgical History:  Procedure Laterality Date   APPENDECTOMY     BREAST ENHANCEMENT SURGERY     DILATION AND CURETTAGE OF UTERUS     EYE SURGERY     lifted cornea (as child done for astigmatism)    There were no vitals filed for this visit.   Subjective Assessment - 06/27/21 0901     Subjective Patient reports she only had 1 black out episode last week - states she did hit the floor but was able to land partly on the sofa - is learning to recognize the feeling but did not get hurt; states she feels pressure in her head - starts behind Left eye and goes to upper trap region; reports she feels the dizziness constantly.  Has difficulty finding the words to talk when she gets tired.    Pertinent History Migraines, HTN    Diagnostic tests MRI scan of the cervical spine without any evidence of nerve root compression, spinal stenosis, fractures or significant arthritis. MRI on the Brain scheduled for Tuesday.    Patient Stated Goals Improve Pain Management; Stop the Falls    Currently in Pain? No/denies    Pain Onset More than a  month ago                               [facility] Adult PT Treatment/Exercise - 06/27/21 0001       Self-Care   Self-Care Other Self-Care Comments    Other Self-Care Comments  discussed concept of  habituation; instructed pt to perform sit to stand, step and pivot turn to each side for 3 reps (slowly) to avoid excessive provocation of dizziness and nausea      Exercises   Exercises Neck      Neck Exercises: Stretches   Other Neck Stretches Lt lateral flexor stretch - 1 rep - 10 sec hold             Vestibular Treatment/Exercise - 06/27/21 0001       Vestibular Treatment/Exercise   Vestibular Treatment Provided Gaze    Gaze Exercises X1 Viewing Horizontal;X1 Viewing Vertical      X1 Viewing Horizontal   Foot Position bil. stance in standing    Time --   15 secs   Reps 1    Comments plain background - standing approx. 5' away from target - pt needed to touch wall due to unsteadiness  X1 Viewing Vertical   Foot Position bil. stance in standing    Time --   10 secs due to c/o nausea   Reps 1    Comments plain background             discussed habituation concept - see self care section above       PT Education - 06/27/21 1934     Education Details x1 viewing exercise    Person(s) Educated Patient    Methods Explanation;Demonstration;Handout    Comprehension Verbalized understanding;Returned demonstration              PT Short Term Goals - 06/27/21 1930       PT SHORT TERM GOAL #1   Title Patient will be indepdent with inital HEP (All STGs Due: 07/08/21)    Baseline to be established    Time 3    Period Weeks    Status New    Target Date 07/08/21      PT SHORT TERM GOAL #2   Title Vestibular Asessment TBA and LTG to be set as applicable    Baseline TBA    Time 3    Period Weeks    Status New      PT SHORT TERM GOAL #3   Title Pt will report 25% improvement in L shoulder/arm pain with functional activities    Baseline  significant pain limiting activities    Time 3    Period Weeks    Status New               PT Long Term Goals - 06/27/21 1931       PT LONG TERM GOAL #1   Title Pt will be independent with final/progressive HEP (ALL LTGs Due: 08/05/21)    Baseline no HEP established    Time 6    Period Weeks    Status New    Target Date 08/05/21      PT LONG TERM GOAL #2   Title Pt will improve all cervical AROM by 5 deg and report </= 2/10 pain with neck movement    Baseline see flowsheet    Time 6    Period Weeks    Status New      PT LONG TERM GOAL #3   Title Pt will report return to cycling/exercise program and report ability to complete >/= 15 minutes without increase in symptoms    Baseline not currently exercising    Time 6    Period Weeks    Status New      PT LONG TERM GOAL #4   Title LTG to be set for Vestibular Assesment                   Plan - 06/27/21 0907     Clinical Impression Statement Pt able to perform x1 viewing exercise in standing horizontally for 15 secs and 10 secs vertically prior to needing to stop exercise due to provocation of dizziness and nausea; symptoms quickly subsided with seated rest period.  Pt reported no neck pain in today's session.  Exercises/activities in today's session were limited by pt's late arrival.  Cont with POC.    Personal Factors and Comorbidities Comorbidity 2    Comorbidities Migraines, HTN    Examination-Activity Limitations Carry;Lift    Examination-Participation Restrictions Occupation;Community Activity;Cleaning    Stability/Clinical Decision Making Stable/Uncomplicated    Rehab Potential Good    PT Frequency 2x / week    PT  Duration 6 weeks    PT Treatment/Interventions ADLs/Self Care Home Management;Aquatic Therapy;Canalith Repostioning;Moist Heat;Cryotherapy;Therapeutic activities;Functional mobility training;Electrical Stimulation;Iontophoresis 4mg /ml Dexamethasone;Stair training;Gait training;Therapeutic  exercise;Balance training;Neuromuscular re-education;Patient/family education;Manual techniques;Taping;Dry needling;Vestibular;Passive range of motion;Joint Manipulations;Spinal Manipulations    PT Next Visit Plan please give pic of lateral cervical flexor stretch and cervical rotation stretch - did not issue due to time constraint on 06-27-21; check x 1 viewing exercise issued for HEP    Consulted and Agree with Plan of Care Patient             Patient will benefit from skilled therapeutic intervention in order to improve the following deficits and impairments:  Dizziness, Decreased range of motion, Decreased activity tolerance, Decreased strength, Impaired flexibility, Postural dysfunction, Pain, Impaired sensation, Increased muscle spasms  Visit Diagnosis: Dizziness and giddiness     Problem List Patient Active Problem List   Diagnosis Date Noted   Neck pain 06/09/2021   Post concussion syndrome 06/05/2021   Syncope 06/05/2021   Persistent cognitive impairment 06/05/2021   Head trauma 06/05/2021   Elevated troponin    LBBB (left bundle branch block)    Acute left-sided weakness 03/17/2020   Occipital headache 06/11/2018   left sided temporal headache 06/11/2018   Occipital neuralgia 06/11/2018   Vitiligo 11/14/2016   Hypertension 07/13/2014   Migraine 07/13/2014    07/15/2014, PT 06/27/2021, 7:35 PM  [facility] [address] [place], [place], [place] Phone: [phone]   Fax:  [phone]  Name: TOCCARA ALFORD MRN: Florina Ou Date of Birth: [birth year]

## 2021-06-27 NOTE — Patient Instructions (Signed)
Tip Card  1.The goal of habituation training is to assist in decreasing symptoms of vertigo, dizziness, or nausea provoked by specific head and body motions. 2.These exercises may initially increase symptoms; however, be persistent and work through symptoms. With repetition and time, the exercises will assist in reducing or eliminating symptoms. 3.Exercises should be stopped and discussed with the therapist if you experience any of the following: - Sudden change or fluctuation in hearing - New onset of ringing in the ears, or increase in current intensity - Any fluid discharge from the ear - Severe pain in neck or back - Extreme nausea  Do slow step and turn  - sit to stand - step with right leg and turn left; step with left leg and turn to right   Gaze Stabilization: Tip Card  1.Target must remain in focus, not blurry, and appear stationary while head is in motion. 2.Perform exercises with small head movements (45 to either side of midline). 3.Increase speed of head motion so long as target is in focus. 4.If you wear eyeglasses, be sure you can see target through lens (therapist will give specific instructions for bifocal / progressive lenses). 5.These exercises may provoke dizziness or nausea. Work through these symptoms. If too dizzy, slow head movement slightly. Rest between each exercise. 6.Exercises demand concentration; avoid distractions. 7.For safety, perform standing exercises close to a counter, wall, corner, or next to someone.     Gaze Stabilization: Standing Feet Apart    Feet shoulder width apart, keeping eyes on target on wall _5_ feet away, tilt head down 15-30 and move head side to side for _60___ seconds. Repeat while moving head up and down for __60__ seconds. Do _3-5___ sessions per day. Repeat using target on pattern background.

## 2021-06-27 NOTE — Therapy (Signed)
[facility] [address] [place], [place], [address] Phone: [phone]   Fax:  [phone]  Speech Language Pathology Treatment  Patient Details  Name: Sharon Friedman MRN: [id number] Date of Birth: 06-Jun-1970 Referring Provider (SLP): Anson Fret, MD   Encounter Date: 06/27/2021   End of Session - 06/27/21 1143     Visit Number 4    Number of Visits 17    Date for SLP Re-Evaluation 08/12/21    Authorization Type [employer], $70 Copay, VL: 30 (combined) auth req'd for addl visits    SLP Start Time 0930    SLP Stop Time  1016    SLP Time Calculation (min) 46 min    Activity Tolerance Patient tolerated treatment well             Past Medical History:  Diagnosis Date   Hypertension    Migraines     Past Surgical History:  Procedure Laterality Date   APPENDECTOMY     BREAST ENHANCEMENT SURGERY     DILATION AND CURETTAGE OF UTERUS     EYE SURGERY     lifted cornea (as child done for astigmatism)    There were no vitals filed for this visit.   Subjective Assessment - 06/27/21 0931     Subjective "I only had one blackout"    Currently in Pain? No/denies                   ADULT SLP TREATMENT - 06/27/21 1139       General Information   Behavior/Cognition Alert;Cooperative;Pleasant mood      Treatment Provided   Treatment provided Cognitive-Linquistic      Cognitive-Linquistic Treatment   Treatment focused on Cognition;Patient/family/caregiver education    Skilled Treatment Fatigue reported following PT session. Pt demonstrated improving metacognitive skills and awareness of how physical/cognitive fatigue impact functioning, including increased word finding and limited attention. SLP provided education and instruction of compensations to aid recall and attention, with handout provided. Pt verbalized understanding and agreement. Pt will utilize recommendations for TedTalk as part of  HEP.      Assessment / Recommendations / Plan   Plan Continue with current plan of care      Progression Toward Goals   Progression toward goals Progressing toward goals              SLP Education - 06/27/21 1143     Education Details strategies for recall/attention    Person(s) Educated Patient    Methods Explanation;Demonstration;Handout    Comprehension Verbalized understanding;Returned demonstration;Need further instruction              SLP Short Term Goals - 06/27/21 1144       SLP SHORT TERM GOAL #1   Title Pt will utilize external/internal memory compensations to aid daily functioning given occasional min A over 2 sessions    Time 2    Period Weeks    Status On-going      SLP SHORT TERM GOAL #2   Title Pt will maintain sustained and focused attention during 15+ minute conversation with used of targeted strategies given occasional A over 2 sessions    Time 2    Period Weeks    Status On-going      SLP SHORT TERM GOAL #3   Title Pt will demonstrate ability to alternate attention successfully between conversation/structured tasks given occasional min A over 2 sessions    Time 2  Period Weeks    Status On-going      SLP SHORT TERM GOAL #4   Title Pt will utilize anomia compensations in 30+ minute conversations given occasional min A over 2 sessions    Time 2    Period Weeks    Status On-going              SLP Long Term Goals - 06/27/21 1145       SLP LONG TERM GOAL #1   Title Pt will utilize external/internal memory compensations to aid daily functioning given rare min A over 2 sessions    Time 6    Period Weeks    Status On-going      SLP LONG TERM GOAL #2   Title Pt will maintain sustained and focused attention during 30+ minute conversations given rare min A over 2 sessions    Time 6    Period Weeks    Status On-going      SLP LONG TERM GOAL #3   Title Pt will accurately manage medications with no missed doses reported > 2 weeks  given rare min A from family    Time 6    Period Weeks    Status On-going      SLP LONG TERM GOAL #4   Title Pt will utilize anomia compensations in work presentations/business calls given rare min A over 2 sessions    Time 6    Period Weeks    Status On-going      SLP LONG TERM GOAL #5   Title Pt will report improved cognitive linguistic functioning via PROM by 5 points at last ST session    Baseline Cognitive Function: 70    Time 6    Period Weeks    Status On-going              Plan - 06/27/21 1143     Clinical Impression Statement Moderate cognitive communication impairments persist. Conducted ongoing education and training of compensations for attention, memory, and sensory over-stimulation. Pt demonstrates improving awareness and increasing independent modifications to accomodate for current cognitive linguistic challenges. Continue skilled ST to return to PLOF, maximize professional success and QOL    Speech Therapy Frequency 2x / week    Duration 8 weeks    Treatment/Interventions Compensatory strategies;Functional tasks;Cueing hierarchy;Environmental controls;Cognitive reorganization;Multimodal communcation approach;Language facilitation;Compensatory techniques;Internal/external aids;SLP instruction and feedback    Potential to Achieve Goals Good    Consulted and Agree with Plan of Care Patient             Patient will benefit from skilled therapeutic intervention in order to improve the following deficits and impairments:   Cognitive communication deficit    Problem List Patient Active Problem List   Diagnosis Date Noted   Neck pain 06/09/2021   Post concussion syndrome 06/05/2021   Syncope 06/05/2021   Persistent cognitive impairment 06/05/2021   Head trauma 06/05/2021   Elevated troponin    LBBB (left bundle branch block)    Acute left-sided weakness 03/17/2020   Occipital headache 06/11/2018   left sided temporal headache 06/11/2018   Occipital  neuralgia 06/11/2018   Vitiligo 11/14/2016   Hypertension 07/13/2014   Migraine 07/13/2014    Gracy Racer, CCC-SLP 06/27/2021, 11:45 AM  [facility] [address] [place], [place], [address] Phone: [phone]   Fax:  [phone]   Name: KEMYAH BUSER MRN: [id number] Date of Birth: [birth year]

## 2021-06-29 ENCOUNTER — Ambulatory Visit: Payer: 59

## 2021-07-04 ENCOUNTER — Other Ambulatory Visit: Payer: Self-pay

## 2021-07-04 ENCOUNTER — Ambulatory Visit: Payer: 59 | Attending: Neurology | Admitting: Physical Therapy

## 2021-07-04 ENCOUNTER — Encounter: Payer: Self-pay | Admitting: Physical Therapy

## 2021-07-04 ENCOUNTER — Ambulatory Visit: Payer: 59

## 2021-07-04 DIAGNOSIS — M79602 Pain in left arm: Secondary | ICD-10-CM | POA: Insufficient documentation

## 2021-07-04 DIAGNOSIS — R42 Dizziness and giddiness: Secondary | ICD-10-CM | POA: Insufficient documentation

## 2021-07-04 DIAGNOSIS — R293 Abnormal posture: Secondary | ICD-10-CM | POA: Insufficient documentation

## 2021-07-04 DIAGNOSIS — R41841 Cognitive communication deficit: Secondary | ICD-10-CM | POA: Insufficient documentation

## 2021-07-04 DIAGNOSIS — M6281 Muscle weakness (generalized): Secondary | ICD-10-CM | POA: Diagnosis present

## 2021-07-04 NOTE — Patient Instructions (Addendum)
Bring your journal to next session please ? ?If you are stressed or overwhelmed by your thoughts, use a "brain dump." Write down all thoughts/feelings onto paper. Then you can review and identify what is important, what needs to be done, or what can wait.  ? ?Again, focus on one task at a time. Be aware if you are attempting to multi-task (ex: talking to your son on the phone while making a Gmail account). Focus 100% of your attention on single task. ? ?Write down important names in a place you can access them easily (phone or notebook). Give yourself grace if you don't remember and look at the answer. That repetition will help your memory.  ? ?Many factors can impact brain functioning - stress, pain, fatigue, etc. If your body is giving your signs (headache, fatigue), listen to your body and rest.  ? ?For homework, read a chapter in your book. See if you can read an extra page OR see how long you can read before having to go back. Continue to write notes/highlight. Be prepared to present about the next chapter next session.    ? ?For MyChart, look for therapy notes and "patient instructions" OR you can bring MyChart password to next session so we can figure it out together ?

## 2021-07-04 NOTE — Therapy (Signed)
[facility] ?[address] ?[place], [place], [address] ?Phone: [phone]   Fax:  [phone] ? ?Speech Language Pathology Treatment ? ?Patient Details  ?Name: Sharon Friedman ?MRN: [id number] ?Date of Birth: 17-Feb-1971 ?Referring Provider (SLP): Anson Fret, MD ? ? ?Encounter Date: 07/04/2021 ? ? End of Session - 07/04/21 0932   ? ? Visit Number 5   ? Number of Visits 17   ? Date for SLP Re-Evaluation 08/12/21   ? Authorization Type [employer], $70 Copay, VL: 30 (combined) auth req'd for addl visits   ? SLP Start Time 0932   ? SLP Stop Time  1019   ? SLP Time Calculation (min) 47 min   ? Activity Tolerance Patient tolerated treatment well   ? ?  ?  ? ?  ? ? ?Past Medical History:  ?Diagnosis Date  ? Hypertension   ? Migraines   ? ? ?Past Surgical History:  ?Procedure Laterality Date  ? APPENDECTOMY    ? BREAST ENHANCEMENT SURGERY    ? DILATION AND CURETTAGE OF UTERUS    ? EYE SURGERY    ? lifted cornea (as child done for astigmatism)  ? ? ?There were no vitals filed for this visit. ? ? Subjective Assessment - 07/04/21 0933   ? ? Subjective "she made me dizzy"   ? Currently in Pain? Other (Comment)   pt reports tinging, cramping sensation on Rt side of head - minimal at this time  ? ?  ?  ? ?  ? ? ? ? ? ? ? ? ADULT SLP TREATMENT - 07/04/21 0932   ? ?  ? General Information  ? Behavior/Cognition Alert;Cooperative;Pleasant mood   ?  ? Treatment Provided  ? Treatment provided Cognitive-Linquistic   ?  ? Cognitive-Linquistic Treatment  ? Treatment focused on Cognition;Patient/family/caregiver education   ? Skilled Treatment Pt reported success using daily journal and writing down information to aid reall and establishing morning routine. Recent personal events has caused signficant stress impacting brain functioning and overall cognitive performance. SLP educated patient on impact of internal/external factors on brain functioning and how to modify  situations to support her cognitive functioning. Pt able to demonstrate use of recommendations with rare min A. Pt exhibits increased awareness of how multitasking impacted error awareness, in which SLP instructed focus on one single task at time to aid accuracy with occasional min A required. Handout provided with information to aid recall.   ?  ? Assessment / Recommendations / Plan  ? Plan Continue with current plan of care   ?  ? Progression Toward Goals  ? Progression toward goals Progressing toward goals   ? ?  ?  ? ?  ? ? ? SLP Education - 07/04/21 0958   ? ? Education Details HEP, strategies to aid brain functioning   ? Person(s) Educated Patient   ? Methods Explanation;Demonstration;Handout   ? Comprehension Verbalized understanding;Returned demonstration;Need further instruction   ? ?  ?  ? ?  ? ? ? SLP Short Term Goals - 07/04/21 0932   ? ?  ? SLP SHORT TERM GOAL #1  ? Title Pt will utilize external/internal memory compensations to aid daily functioning given occasional min A over 2 sessions   ? Baseline 07-04-21   ? Time 1   ? Period Weeks   ? Status On-going   ?  ? SLP SHORT TERM GOAL #2  ? Title Pt will maintain sustained and focused attention during  15+ minute conversation with used of targeted strategies given occasional A over 2 sessions   ? Baseline 07-04-21   ? Time 1   ? Period Weeks   ? Status On-going   ?  ? SLP SHORT TERM GOAL #3  ? Title Pt will demonstrate ability to alternate attention successfully between conversation/structured tasks given occasional min A over 2 sessions   ? Time 1   ? Period Weeks   ? Status On-going   ?  ? SLP SHORT TERM GOAL #4  ? Title Pt will utilize anomia compensations in 30+ minute conversations given occasional min A over 2 sessions   ? Baseline 07-04-21   ? Time 1   ? Period Weeks   ? Status On-going   ? ?  ?  ? ?  ? ? ? SLP Long Term Goals - 07/04/21 0933   ? ?  ? SLP LONG TERM GOAL #1  ? Title Pt will utilize external/internal memory compensations to aid daily  functioning given rare min A over 2 sessions   ? Time 5   ? Period Weeks   ? Status On-going   ?  ? SLP LONG TERM GOAL #2  ? Title Pt will maintain sustained and focused attention during 30+ minute conversations given rare min A over 2 sessions   ? Time 5   ? Period Weeks   ? Status On-going   ?  ? SLP LONG TERM GOAL #3  ? Title Pt will accurately manage medications with no missed doses reported > 2 weeks given rare min A from family   ? Time 5   ? Period Weeks   ? Status On-going   ?  ? SLP LONG TERM GOAL #4  ? Title Pt will utilize anomia compensations in work presentations/business calls given rare min A over 2 sessions   ? Time 5   ? Period Weeks   ? Status On-going   ?  ? SLP LONG TERM GOAL #5  ? Title Pt will report improved cognitive linguistic functioning via PROM by 5 points at last ST session   ? Baseline Cognitive Function: 70   ? Time 5   ? Period Weeks   ? Status On-going   ? ?  ?  ? ?  ? ? ? Plan - 07/04/21 0932   ? ? Clinical Impression Statement Moderate cognitive communication impairments persist. Conducted ongoing education and training of compensations for attention, memory, and sensory over-stimulation. Pt demonstrates improving awareness and increasing independent modifications to accomodate for current cognitive linguistic challenges. Continue skilled ST to return to PLOF, maximize professional success and QOL   ? Speech Therapy Frequency 2x / week   ? Duration 8 weeks   ? Treatment/Interventions Compensatory strategies;Functional tasks;Cueing hierarchy;Environmental controls;Cognitive reorganization;Multimodal communcation approach;Language facilitation;Compensatory techniques;Internal/external aids;SLP instruction and feedback   ? Potential to Achieve Goals Good   ? Consulted and Agree with Plan of Care Patient   ? ?  ?  ? ?  ? ? ?Patient will benefit from skilled therapeutic intervention in order to improve the following deficits and impairments:   ?Cognitive communication  deficit ? ? ? ?Problem List ?Patient Active Problem List  ? Diagnosis Date Noted  ? Neck pain 06/09/2021  ? Post concussion syndrome 06/05/2021  ? Syncope 06/05/2021  ? 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 Head trauma 06/05/2021  ? Elevated troponin   ? LBBB (left bundle branch block)   ? Acute left-sided weakness 03/17/2020  ?  Occipital headache 06/11/2018  ? left sided temporal headache 06/11/2018  ? Occipital neuralgia 06/11/2018  ? Vitiligo 11/14/2016  ? Hypertension 07/13/2014  ? Migraine 07/13/2014  ? ? ?Gracy Racer, CCC-SLP ?07/04/2021, 10:23 AM ? ?[place] ?[address] ?[place], [place], [address] ?Phone: [phone]   Fax:  [phone] ? ? ?Name: Sharon Friedman ?MRN: [id number] ?Date of Birth: Jun 21, 1970 ? ?

## 2021-07-04 NOTE — Therapy (Signed)
?OUTPATIENT PHYSICAL THERAPY TREATMENT NOTE ? ? ?Patient Name: Sharon Friedman ?MRN: [id number] ?DOB:22-Feb-1971, 51 y.o., female ?Today's Date: 07/04/2021 ? ?PCP: Michael Boston, MD ?REFERRING PROVIDER: Melvenia Beam, MD ? ? PT End of Session - 07/04/21 1158   ? ? Visit Number 4   ? Number of Visits 13   ? Date for PT Re-Evaluation 07/22/21   ? Authorization Type [employer], $70 Copay, VL: 30 (combined) auth reqd for addl visits   ? PT Start Time [phone]   pt arrived 31" late for appt  ? PT Stop Time [id number]   ? PT Time Calculation (min) 38 min   ? Activity Tolerance Patient tolerated treatment well   ? Behavior During Therapy [facility] for tasks assessed/performed   ? ?  ?  ? ?  ? ? ?Past Medical History:  ?Diagnosis Date  ? Hypertension   ? Migraines   ? ?Past Surgical History:  ?Procedure Laterality Date  ? APPENDECTOMY    ? BREAST ENHANCEMENT SURGERY    ? DILATION AND CURETTAGE OF UTERUS    ? EYE SURGERY    ? lifted cornea (as child done for astigmatism)  ? ?Patient Active Problem List  ? Diagnosis Date Noted  ? Neck pain 06/09/2021  ? Post concussion syndrome 06/05/2021  ? Syncope 06/05/2021  ? 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 Head trauma 06/05/2021  ? Elevated troponin   ? LBBB (left bundle branch block)   ? Acute left-sided weakness 03/17/2020  ? Occipital headache 06/11/2018  ? left sided temporal headache 06/11/2018  ? Occipital neuralgia 06/11/2018  ? Vitiligo 11/14/2016  ? Hypertension 07/13/2014  ? Migraine 07/13/2014  ? ? ?REFERRING DIAG: S09.90XA (ICD-10-CM) - Traumatic injury of head, initial encounter R53.83 (ICD-10-CM) - Other fatigue S13.4XXA (ICD-10-CM) - Whiplash injury to neck, initial encounter F07.81 (ICD-10-CM) - Post concussion syndrome M54.81 (ICD-10-CM) - Bilateral occipital neuralgia  ? ?THERAPY DIAG:  ?Dizziness and giddiness ? ?Abnormal posture ? ?PERTINENT HISTORY:  ? Hypertension    ? Migraines    ?  ?PRECAUTIONS: Fall;Other (comment)  ?  ?Precaution Comments ?Fall, Migraines,  HTN  ?  ?  ? ?SUBJECTIVE: Pt reports the cervical stretch helps a lot (lateral cervical flexor stretch); states she was unable to come to PT last Wed. Because she was too dizzy ? ?PAIN:  ?Are you having pain? No; pt does report cramping and tingling sensation on Rt side of head but no true pain ? ? ?TODAY'S TREATMENT:  ? ?TherEx; Issued cervical stretches for HEP ?Pt performed lateral cervical stretch for Rt and Lt sides 1 rep 20 sec hold; cervical extension with use of towel 1 rep 20 sec hold ?Cervical rotation with use of towel 2 reps to each side 20 sec hold ? ?Medbridge HEP ; [id number] ? ?NMR: ?Pt performed sit to stand transfers with minimal UE support with EC from mat 3 reps;  ?X1 viewing in standing - 5' away from target; horizontal 5.6 secs on 1st rep;  9.3 secs on 2nd rep with c/o nausea and vertigo ?Vertical x1 viewing - 8.4 secs on 1st rep;  8.62 secs on 2nd rep ? ?Standing Balance: ?Surface: Airex ?Position: Feet Hip Width Apart ?Completed with: Eyes Open; Head Turns x 5 Reps both horizontal and vertical with rest periods after 3 reps due to c/o vertigo and some nausea ? ?  ?   ? ?PATIENT EDUCATION: ?Education details: Reviewed x1 viewing ex.  ?Person educated: Patient ?Education method: Explanation, Demonstration, and  Tactile cues; pt was initially performing incorrectly with too large ROM and not keeping eyes on target ?Education comprehension: returned demonstration and verbal cues required ? ? ?HOME EXERCISE PROGRAM: ?Medbridge [id number] ? ? PT Short Term Goals - 06/27/21 1930   ? ?  ? PT SHORT TERM GOAL #1  ? Title Patient will be indepdent with inital HEP (All STGs Due: 07/08/21)   ? Baseline to be established   ? Time 3   ? Period Weeks   ? Status New   ? Target Date 07/08/21   ?  ? PT SHORT TERM GOAL #2  ? Title Vestibular Asessment TBA and LTG to be set as applicable   ? Baseline TBA   ? Time 3   ? Period Weeks   ? Status New   ?  ? PT SHORT TERM GOAL #3  ? Title Pt will report 25% improvement in L  shoulder/arm pain with functional activities   ? Baseline significant pain limiting activities   ? Time 3   ? Period Weeks   ? Status New   ? ?  ?  ? ?  ? ? ? PT Long Term Goals - 06/27/21 1931   ? ?  ? PT LONG TERM GOAL #1  ? Title Pt will be independent with final/progressive HEP (ALL LTGs Due: 08/05/21)   ? Baseline no HEP established   ? Time 6   ? Period Weeks   ? Status New   ? Target Date 08/05/21   ?  ? PT LONG TERM GOAL #2  ? Title Pt will improve all cervical AROM by 5 deg and report </= 2/10 pain with neck movement   ? Baseline see flowsheet   ? Time 6   ? Period Weeks   ? Status New   ?  ? PT LONG TERM GOAL #3  ? Title Pt will report return to cycling/exercise program and report ability to complete >/= 15 minutes without increase in symptoms   ? Baseline not currently exercising   ? Time 6   ? Period Weeks   ? Status New   ?  ? PT LONG TERM GOAL #4  ? Title LTG to be set for Vestibular Assesment   ? ?  ?  ? ?  ? ? ? Plan - 07/04/21 1159   ? ? Clinical Impression Statement Pt continues to c/o dizziness and nausea with vestibular exercises and with x1 viewing.  Pt reports she initially gets chills & nausea prior to onset of dizziness.  Pt only able to perform x1 viewing horizontally 5 secs on 1st rep and 9 secs on 2nd rep; vertical x1 viewing was performed for 8.4 and 8.6 secs prior to onset of dizziness.  Pt needed cues to perform x1 viewing ex correctly as she was performing without eyes focused on target and with too large ROM initially.  Pt did report that the cervical stretches were beneficial and helped to provide relief of cervical tightness, primarily in Lt upper trap.  Cont with POC.   ? Personal Factors and Comorbidities Comorbidity 2   ? Comorbidities Migraines, HTN   ? Examination-Activity Limitations Carry;Lift   ? Examination-Participation Restrictions Occupation;Community Activity;Cleaning   ? Stability/Clinical Decision Making Stable/Uncomplicated   ? Rehab Potential Good   ? PT Frequency 2x  / week   ? PT Duration 6 weeks   ? PT Treatment/Interventions ADLs/Self Care Home Management;Aquatic Therapy;Canalith Repostioning;Moist Heat;Cryotherapy;Therapeutic activities;Functional mobility training;Electrical Stimulation;Iontophoresis 4mg /ml Dexamethasone;Stair training;Gait training;Therapeutic  exercise;Balance training;Neuromuscular re-education;Patient/family education;Manual techniques;Taping;Dry needling;Vestibular;Passive range of motion;Joint Manipulations;Spinal Manipulations   ? PT Next Visit Plan Check STG's next session; cont with vestibular exs.   ? Consulted and Agree with Plan of Care Patient   ? ?  ?  ? ?  ? ? ? ? ?Alda Lea, PT ?07/04/2021, 12:09 PM ? ?   ?

## 2021-07-07 ENCOUNTER — Ambulatory Visit: Payer: 59 | Admitting: Speech Pathology

## 2021-07-11 ENCOUNTER — Ambulatory Visit: Payer: 59

## 2021-07-14 ENCOUNTER — Encounter: Payer: 59 | Admitting: Physical Therapy

## 2021-07-14 ENCOUNTER — Ambulatory Visit: Payer: 59

## 2021-07-18 ENCOUNTER — Ambulatory Visit: Payer: 59 | Admitting: Speech Pathology

## 2021-07-18 ENCOUNTER — Telehealth: Payer: Self-pay | Admitting: Physical Therapy

## 2021-07-18 ENCOUNTER — Ambulatory Visit: Payer: 59 | Admitting: Physical Therapy

## 2021-07-18 NOTE — Therapy (Deleted)
?OUTPATIENT PHYSICAL THERAPY TREATMENT NOTE ? ? ?Patient Name: Sharon Friedman ?MRN: [id number] ?DOB:Apr 23, 1971, 51 y.o., female ?Today's Date: 07/18/2021 ? ?PCP: Melida Quitter, MD ?REFERRING PROVIDER: Anson Fret, MD ? ? ? ? ?Past Medical History:  ?Diagnosis Date  ? Hypertension   ? Migraines   ? ?Past Surgical History:  ?Procedure Laterality Date  ? APPENDECTOMY    ? BREAST ENHANCEMENT SURGERY    ? DILATION AND CURETTAGE OF UTERUS    ? EYE SURGERY    ? lifted cornea (as child done for astigmatism)  ? ?Patient Active Problem List  ? Diagnosis Date Noted  ? Neck pain 06/09/2021  ? Post concussion syndrome 06/05/2021  ? Syncope 06/05/2021  ? 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 Head trauma 06/05/2021  ? Elevated troponin   ? LBBB (left bundle branch block)   ? Acute left-sided weakness 03/17/2020  ? Occipital headache 06/11/2018  ? left sided temporal headache 06/11/2018  ? Occipital neuralgia 06/11/2018  ? Vitiligo 11/14/2016  ? Hypertension 07/13/2014  ? Migraine 07/13/2014  ? ? ?REFERRING DIAG: S09.90XA (ICD-10-CM) - Traumatic injury of head, initial encounter R53.83 (ICD-10-CM) - Other fatigue S13.4XXA (ICD-10-CM) - Whiplash injury to neck, initial encounter F07.81 (ICD-10-CM) - Post concussion syndrome M54.81 (ICD-10-CM) - Bilateral occipital neuralgia  ? ?THERAPY DIAG:  ?No diagnosis found. ? ?PERTINENT HISTORY:  ? Hypertension    ? Migraines    ?  ?PRECAUTIONS: Fall;Other (comment)  ?  ?Precaution Comments ?Fall, Migraines, HTN  ?  ? ?SUBJECTIVE:  ? ?PAIN:  ?Are you having pain? No; pt does report cramping and tingling sensation on Rt side of head but no true pain ? ? ?TODAY'S TREATMENT:  ? ?07/18/2021: ? ? ?07/04/2021: ?TherEx; Issued cervical stretches for HEP ?Pt performed lateral cervical stretch for Rt and Lt sides 1 rep 20 sec hold; cervical extension with use of towel 1 rep 20 sec hold ?Cervical rotation with use of towel 2 reps to each side 20 sec hold ? ?Medbridge HEP ;  [id number] ? ?NMR: ?Pt performed sit to stand transfers with minimal UE support with EC from mat 3 reps;  ?X1 viewing in standing - 5' away from target; horizontal 5.6 secs on 1st rep;  9.3 secs on 2nd rep with c/o nausea and vertigo ?Vertical x1 viewing - 8.4 secs on 1st rep;  8.62 secs on 2nd rep ? ?Standing Balance: ?Surface: Airex ?Position: Feet Hip Width Apart ?Completed with: Eyes Open; Head Turns x 5 Reps both horizontal and vertical with rest periods after 3 reps due to c/o vertigo and some nausea ? ? ?PATIENT EDUCATION: ?Education details: Reviewed x1 viewing ex.  ?Person educated: Patient ?Education method: Explanation, Demonstration, and Tactile cues; pt was initially performing incorrectly with too large ROM and not keeping eyes on target ?Education comprehension: returned demonstration and verbal cues required ? ? ?HOME EXERCISE PROGRAM: ?Medbridge [id number] ? ? PT Short Term Goals - 06/27/21 1930   ? ?  ? PT SHORT TERM GOAL #1  ? Title Patient will be indepdent with inital HEP (All STGs Due: 07/08/21)   ? Baseline to be established   ? Time 3   ? Period Weeks   ? Status New   ? Target Date 07/08/21   ?  ? PT SHORT TERM GOAL #2  ? Title Vestibular Asessment TBA and LTG to be set as applicable   ? Baseline TBA   ? Time 3   ? Period Weeks   ? Status New   ?  ?  PT SHORT TERM GOAL #3  ? Title Pt will report 25% improvement in L shoulder/arm pain with functional activities   ? Baseline significant pain limiting activities   ? Time 3   ? Period Weeks   ? Status New   ? ?  ?  ? ?  ? ? ? PT Long Term Goals - 06/27/21 1931   ? ?  ? PT LONG TERM GOAL #1  ? Title Pt will be independent with final/progressive HEP (ALL LTGs Due: 08/05/21)   ? Baseline no HEP established   ? Time 6   ? Period Weeks   ? Status New   ? Target Date 08/05/21   ?  ? PT LONG TERM GOAL #2  ? Title Pt will improve all cervical AROM by 5 deg and report </= 2/10 pain with neck movement   ? Baseline see flowsheet   ? Time 6   ? Period Weeks   ?  Status New   ?  ? PT LONG TERM GOAL #3  ? Title Pt will report return to cycling/exercise program and report ability to complete >/= 15 minutes without increase in symptoms   ? Baseline not currently exercising   ? Time 6   ? Period Weeks   ? Status New   ?  ? PT LONG TERM GOAL #4  ? Title LTG to be set for Vestibular Assesment   ? ?  ?  ? ?  ? ?Clinical Impression Statement   ?Personal Factors and Comorbidities Comorbidity 2   ?Comorbidities Migraines, HTN   ?Examination-Activity Limitations Carry;Lift   ?Examination-Participation Restrictions Occupation;Community Activity;Cleaning   ?Stability/Clinical Decision Making Stable/Uncomplicated   ?Rehab Potential Good   ?PT Frequency 2x / week   ?PT Duration 6 weeks   ?PT Treatment/Interventions ADLs/Self Care Home Management;Aquatic Therapy;Canalith Repostioning;Moist Heat;Cryotherapy;Therapeutic activities;Functional mobility training;Electrical Stimulation;Iontophoresis 4mg /ml Dexamethasone;Stair training;Gait training;Therapeutic exercise;Balance training;Neuromuscular re-education;Patient/family education;Manual techniques;Taping;Dry needling;Vestibular;Passive range of motion;Joint Manipulations;Spinal Manipulations   ?PT Next Visit Plan   ?Consulted and Agree with Plan of Care Patient   ? ? ? ? ? ? ? , PT ?07/18/2021, 1:04 PM ? ?   ?

## 2021-07-18 NOTE — Telephone Encounter (Signed)
Pt arrived earlier this morning, thought her appointment was at 8:45 this morning.   ? ?Front office staff notified pt that her Speech appointment was at 12:30 and PT at 1:15.  Pt stated she would return.  Front office also provided pt with new print out of appointments. ? ?Pt did not show for either speech or physical therapy in the afternoon.  PT contacted patient about missed visit.  Pt reports her transportation did not come back to pick her up for her afternoon appointments. Pt reports she has scheduled more reliable transportation for appointments going forwards and plans to be there on Thursday. ? ?Dierdre Highman, PT, DPT ?07/18/21    1:36 PM ? ? ?

## 2021-07-21 ENCOUNTER — Ambulatory Visit: Payer: 59

## 2021-07-21 ENCOUNTER — Ambulatory Visit: Payer: 59 | Admitting: Physical Therapy

## 2021-07-21 ENCOUNTER — Other Ambulatory Visit: Payer: Self-pay

## 2021-07-21 DIAGNOSIS — R42 Dizziness and giddiness: Secondary | ICD-10-CM | POA: Diagnosis not present

## 2021-07-21 DIAGNOSIS — R41841 Cognitive communication deficit: Secondary | ICD-10-CM

## 2021-07-21 NOTE — Therapy (Signed)
[facility] ?[address] ?[place], [place], [address] ?Phone: [phone]   Fax:  [phone] ? ?Speech Language Pathology Treatment ? ?Patient Details  ?Name: Sharon Friedman ?MRN: [id number] ?Date of Birth: 07-13-1970 ?Referring Provider (SLP): Anson Fret, MD ? ? ?Encounter Date: 07/21/2021 ? ? End of Session - 07/21/21 1400   ? ? Visit Number 6   ? Number of Visits 17   ? Date for SLP Re-Evaluation 08/12/21   ? Authorization Type [employer], $70 Copay, VL: 30 (combined) auth req'd for addl visits   ? SLP Start Time 1401   ? SLP Stop Time  1445   ? SLP Time Calculation (min) 44 min   ? Activity Tolerance Patient tolerated treatment well   ? ?  ?  ? ?  ? ? ?Past Medical History:  ?Diagnosis Date  ? Hypertension   ? Migraines   ? ? ?Past Surgical History:  ?Procedure Laterality Date  ? APPENDECTOMY    ? BREAST ENHANCEMENT SURGERY    ? DILATION AND CURETTAGE OF UTERUS    ? EYE SURGERY    ? lifted cornea (as child done for astigmatism)  ? ? ?There were no vitals filed for this visit. ? ? Subjective Assessment - 07/21/21 1401   ? ? Subjective "After COVID-19, I've had blackouts and headaches"   ? Currently in Pain? No/denies   ? ?  ?  ? ?  ? ? ? ? ? ? ? ? ADULT SLP TREATMENT - 07/21/21 1400   ? ?  ? General Information  ? Behavior/Cognition Alert;Cooperative;Pleasant mood   ?  ? Treatment Provided  ? Treatment provided Cognitive-Linquistic   ?  ? Cognitive-Linquistic Treatment  ? Treatment focused on Cognition;Patient/family/caregiver education   ? Skilled Treatment Some setbacks reported related to recent contraction of COVID-19, including increased fatigue, brain fog, and headaches. Success with energy conservation and implementing cognitive strategies (i.e., writing down information) continued. Some improved endurance for reading and conversation indicated (10 up to 20 minutes). SLP provided re-education and recommendations re: limiting  multitasking to aid task completion and energy conservation as well as  establishing weekly goals. Pt verbalized understanding and agreement with SLP recs.   ?  ? Assessment / Recommendations / Plan  ? Plan Continue with current plan of care   ?  ? Progression Toward Goals  ? Progression toward goals Progressing toward goals   ? ?  ?  ? ?  ? ? ? SLP Education - 07/21/21 1619   ? ? Education Details HEP, compensatory strategies   ? Person(s) Educated Patient   ? Methods Explanation;Demonstration;Handout   ? Comprehension Verbalized understanding;Returned demonstration;Need further instruction   ? ?  ?  ? ?  ? ? ? SLP Short Term Goals - 07/21/21 1401   ? ?  ? SLP SHORT TERM GOAL #1  ? Title Pt will utilize external/internal memory compensations to aid daily functioning given occasional min A over 2 sessions   ? Baseline 07-04-21, 07-21-21   ? Time 1   ? Period Weeks   ? Status Achieved   ?  ? SLP SHORT TERM GOAL #2  ? Title Pt will maintain sustained and focused attention during 15+ minute conversation with used of targeted strategies given occasional A over 2 sessions   ? Baseline 07-04-21, 07-21-21   ? Time 1   ? Period Weeks   ? Status Achieved   ?  ? SLP SHORT TERM  GOAL #3  ? Title Pt will demonstrate ability to alternate attention successfully between conversation/structured tasks given occasional min A over 2 sessions   ? Time 1   ? Period Weeks   ? Status On-going   ?  ? SLP SHORT TERM GOAL #4  ? Title Pt will utilize anomia compensations in 30+ minute conversations given occasional min A over 2 sessions   ? Baseline 07-04-21, 07-21-21   ? Time 1   ? Period Weeks   ? Status Achieved   ? ?  ?  ? ?  ? ? ? SLP Long Term Goals - 07/21/21 1401   ? ?  ? SLP LONG TERM GOAL #1  ? Title Pt will utilize external/internal memory compensations to aid daily functioning given rare min A over 2 sessions   ? Time 4   ? Period Weeks   ? Status On-going   ?  ? SLP LONG TERM GOAL #2  ? Title Pt will maintain sustained and focused  attention during 30+ minute conversations given rare min A over 2 sessions   ? Time 4   ? Period Weeks   ? Status On-going   ?  ? SLP LONG TERM GOAL #3  ? Title Pt will accurately manage medications with no missed doses reported > 2 weeks given rare min A from family   ? Time 4   ? Period Weeks   ? Status On-going   ?  ? SLP LONG TERM GOAL #4  ? Title Pt will utilize anomia compensations in work presentations/business calls given rare min A over 2 sessions   ? Time 4   ? Period Weeks   ? Status On-going   ?  ? SLP LONG TERM GOAL #5  ? Title Pt will report improved cognitive linguistic functioning via PROM by 5 points at last ST session   ? Baseline Cognitive Function: 70   ? Time 4   ? Period Weeks   ? Status On-going   ? ?  ?  ? ?  ? ? ? Plan - 07/21/21 1400   ? ? Clinical Impression Statement Improvements in moderate cognitive communication impairments recently plateued by recent contraction of COVID-19. Conducted ongoing education and training of compensations for attention, memory, and sensory over-stimulation. Pt demonstrates improving awareness and increasing independent modifications to accomodate for current cognitive linguistic challenges. Continue skilled ST to return to PLOF, maximize professional success and QOL   ? Speech Therapy Frequency 2x / week   ? Duration 8 weeks   ? Treatment/Interventions Compensatory strategies;Functional tasks;Cueing hierarchy;Environmental controls;Cognitive reorganization;Multimodal communcation approach;Language facilitation;Compensatory techniques;Internal/external aids;SLP instruction and feedback   ? Potential to Achieve Goals Good   ? Consulted and Agree with Plan of Care Patient   ? ?  ?  ? ?  ? ? ?Patient will benefit from skilled therapeutic intervention in order to improve the following deficits and impairments:   ?Cognitive communication deficit ? ? ? ?Problem List ?Patient Active Problem List  ? Diagnosis Date Noted  ? Neck pain 06/09/2021  ? Post concussion  syndrome 06/05/2021  ? Syncope 06/05/2021  ? 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 Head trauma 06/05/2021  ? Elevated troponin   ? LBBB (left bundle branch block)   ? Acute left-sided weakness 03/17/2020  ? Occipital headache 06/11/2018  ? left sided temporal headache 06/11/2018  ? Occipital neuralgia 06/11/2018  ? Vitiligo 11/14/2016  ? Hypertension 07/13/2014  ? Migraine 07/13/2014  ? ? ?Gracy RacerKatherine I Johnson,  CCC-SLP ?07/21/2021, 4:23 PM ? ?[facility] ?[address] ?[place], [place], [address] ?Phone: [phone]   Fax:  [phone] ? ? ?Name: JANYIAH SILVERI ?MRN: [id number] ?Date of Birth: 08-16-1970 ? ?

## 2021-07-21 NOTE — Patient Instructions (Addendum)
While it feels like a setback with COVID, remember you have all the tools to continue to make progress:  ?-Continue to focus on one task at a time. Heighten your awareness by identifying scenarios in which you might be tempted to multitasking (talking on the phone, etc). ?-External aids (I.e., rubber band) are helpful to keep you on track  ? -Setting boundaries and abiding by them (ex: 20 minutes) ? -Remember: quality over quantity  ? -Set a weekly goal (don't overwhelm yourself with trying to do everything all at once)  ? ?Podcast - [employer] ?

## 2021-07-21 NOTE — Therapy (Deleted)
?OUTPATIENT PHYSICAL THERAPY TREATMENT NOTE ? ? ?Patient Name: Sharon Friedman ?MRN: [id number] ?DOB:01-13-1971, 51 y.o., female ?Today's Date: 07/21/2021 ? ?PCP: Michael Boston, MD ?REFERRING PROVIDER: Melvenia Beam, MD ? ? ? ? ?Past Medical History:  ?Diagnosis Date  ? Hypertension   ? Migraines   ? ?Past Surgical History:  ?Procedure Laterality Date  ? APPENDECTOMY    ? BREAST ENHANCEMENT SURGERY    ? DILATION AND CURETTAGE OF UTERUS    ? EYE SURGERY    ? lifted cornea (as child done for astigmatism)  ? ?Patient Active Problem List  ? Diagnosis Date Noted  ? Neck pain 06/09/2021  ? Post concussion syndrome 06/05/2021  ? Syncope 06/05/2021  ? 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 Head trauma 06/05/2021  ? Elevated troponin   ? LBBB (left bundle branch block)   ? Acute left-sided weakness 03/17/2020  ? Occipital headache 06/11/2018  ? left sided temporal headache 06/11/2018  ? Occipital neuralgia 06/11/2018  ? Vitiligo 11/14/2016  ? Hypertension 07/13/2014  ? Migraine 07/13/2014  ? ? ?REFERRING DIAG: S09.90XA (ICD-10-CM) - Traumatic injury of head, initial encounter R53.83 (ICD-10-CM) - Other fatigue S13.4XXA (ICD-10-CM) - Whiplash injury to neck, initial encounter F07.81 (ICD-10-CM) - Post concussion syndrome M54.81 (ICD-10-CM) - Bilateral occipital neuralgia  ? ?THERAPY DIAG:  ?No diagnosis found. ? ?PERTINENT HISTORY:  ? Hypertension    ? Migraines    ?  ?PRECAUTIONS: Fall;Other (comment)  ?  ?Precaution Comments ?Fall, Migraines, HTN  ?  ?  ? ?SUBJECTIVE:  ? ?PAIN:  ?Are you having pain? No; pt does report cramping and tingling sensation on Rt side of head but no true pain ? ? ?TODAY'S TREATMENT:  ? ?07/21/21: ? ?07/04/21: ?TherEx; Issued cervical stretches for HEP ?Pt performed lateral cervical stretch for Rt and Lt sides 1 rep 20 sec hold; cervical extension with use of towel 1 rep 20 sec hold ?Cervical rotation with use of towel 2 reps to each side 20 sec hold ? ?[employer] HEP ; [id number] ? ?NMR: ?Pt  performed sit to stand transfers with minimal UE support with EC from mat 3 reps;  ?X1 viewing in standing - 5' away from target; horizontal 5.6 secs on 1st rep;  9.3 secs on 2nd rep with c/o nausea and vertigo ?Vertical x1 viewing - 8.4 secs on 1st rep;  8.62 secs on 2nd rep ? ?Standing Balance: ?Surface: Airex ?Position: Feet Hip Width Apart ?Completed with: Eyes Open; Head Turns x 5 Reps both horizontal and vertical with rest periods after 3 reps due to c/o vertigo and some nausea  ? ?PATIENT EDUCATION: ?Education details:  ?Person educated: Patient ?Education method: Explanation, Demonstration, and Tactile cues; pt was initially performing incorrectly with too large ROM and not keeping eyes on target ?Education comprehension: returned demonstration and verbal cues required ? ? ?HOME EXERCISE PROGRAM: ?[employer] [id number] ? ? PT Short Term Goals - 06/27/21 1930   ? ?  ? PT SHORT TERM GOAL #1  ? Title Patient will be indepdent with inital HEP (All STGs Due: 07/08/21)   ? Baseline to be established   ? Time 3   ? Period Weeks   ? Status New   ? Target Date 07/08/21   ?  ? PT SHORT TERM GOAL #2  ? Title Vestibular Asessment TBA and LTG to be set as applicable   ? Baseline TBA   ? Time 3   ? Period Weeks   ? Status New   ?  ?  PT SHORT TERM GOAL #3  ? Title Pt will report 25% improvement in L shoulder/arm pain with functional activities   ? Baseline significant pain limiting activities   ? Time 3   ? Period Weeks   ? Status New   ? ?  ?  ? ?  ? ? ? PT Long Term Goals - 06/27/21 1931   ? ?  ? PT LONG TERM GOAL #1  ? Title Pt will be independent with final/progressive HEP (ALL LTGs Due: 08/05/21)   ? Baseline no HEP established   ? Time 6   ? Period Weeks   ? Status New   ? Target Date 08/05/21   ?  ? PT LONG TERM GOAL #2  ? Title Pt will improve all cervical AROM by 5 deg and report </= 2/10 pain with neck movement   ? Baseline see flowsheet   ? Time 6   ? Period Weeks   ? Status New   ?  ? PT LONG TERM GOAL #3  ? Title Pt  will report return to cycling/exercise program and report ability to complete >/= 15 minutes without increase in symptoms   ? Baseline not currently exercising   ? Time 6   ? Period Weeks   ? Status New   ?  ? PT LONG TERM GOAL #4  ? Title LTG to be set for Vestibular Assesment   ? ?  ?  ? ?  ? ?Clinical Impression Statement   ?Personal Factors and Comorbidities Comorbidity 2   ?Comorbidities Migraines, HTN   ?Examination-Activity Limitations Carry;Lift   ?Examination-Participation Restrictions Occupation;Community Activity;Cleaning   ?Stability/Clinical Decision Making Stable/Uncomplicated   ?Rehab Potential Good   ?PT Frequency 2x / week   ?PT Duration 6 weeks   ?PT Treatment/Interventions ADLs/Self Care Home Management;Aquatic Therapy;Canalith Repostioning;Moist Heat;Cryotherapy;Therapeutic activities;Functional mobility training;Electrical Stimulation;Iontophoresis 4mg /ml Dexamethasone;Stair training;Gait training;Therapeutic exercise;Balance training;Neuromuscular re-education;Patient/family education;Manual techniques;Taping;Dry needling;Vestibular;Passive range of motion;Joint Manipulations;Spinal Manipulations   ?PT Next Visit Plan Check STG's next session; cont with vestibular exs.   ?Consulted and Agree with Plan of Care Patient   ? ? ? ? ? ? ?Rico Junker, PT ?07/21/2021, 1:16 PM ? ?   ?

## 2021-07-25 ENCOUNTER — Ambulatory Visit: Payer: 59 | Admitting: Speech Pathology

## 2021-07-25 ENCOUNTER — Encounter: Payer: Self-pay | Admitting: Speech Pathology

## 2021-07-25 ENCOUNTER — Other Ambulatory Visit: Payer: Self-pay

## 2021-07-25 ENCOUNTER — Ambulatory Visit: Payer: 59

## 2021-07-25 DIAGNOSIS — M79602 Pain in left arm: Secondary | ICD-10-CM

## 2021-07-25 DIAGNOSIS — R42 Dizziness and giddiness: Secondary | ICD-10-CM | POA: Diagnosis not present

## 2021-07-25 DIAGNOSIS — M6281 Muscle weakness (generalized): Secondary | ICD-10-CM

## 2021-07-25 DIAGNOSIS — R293 Abnormal posture: Secondary | ICD-10-CM

## 2021-07-25 DIAGNOSIS — R41841 Cognitive communication deficit: Secondary | ICD-10-CM

## 2021-07-25 NOTE — Therapy (Signed)
[facility] ?[facility] ?[address] ?[place], [place], [address] ?Phone: [phone]   Fax:  [phone] ? ?Speech Language Pathology Treatment ? ?Patient Details  ?Name: Sharon Friedman ?MRN: [id number] ?Date of Birth: 18-Nov-1970 ?Referring Provider (SLP): Anson Fret, MD ? ? ?Encounter Date: 07/25/2021 ? ? End of Session - 07/25/21 1114   ? ? Visit Number 7   ? Number of Visits 17   ? Date for SLP Re-Evaluation 08/12/21   ? Authorization Type [employer], $70 Copay, VL: 30 (combined) auth req'd for addl visits   ? SLP Start Time 1015   ? SLP Stop Time  1100   ? SLP Time Calculation (min) 45 min   ? Activity Tolerance Patient tolerated treatment well   ? ?  ?  ? ?  ? ? ?Past Medical History:  ?Diagnosis Date  ? Hypertension   ? Migraines   ? ? ?Past Surgical History:  ?Procedure Laterality Date  ? APPENDECTOMY    ? BREAST ENHANCEMENT SURGERY    ? DILATION AND CURETTAGE OF UTERUS    ? EYE SURGERY    ? lifted cornea (as child done for astigmatism)  ? ? ?There were no vitals filed for this visit. ? ? Subjective Assessment - 07/25/21 1024   ? ? Subjective "I have gotten back into the therapy routine"   ? Currently in Pain? No/denies   ? ?  ?  ? ?  ? ? ? ? ? ? ? ? ADULT SLP TREATMENT - 07/25/21 1023   ? ?  ? General Information  ? Behavior/Cognition Alert;Cooperative;Pleasant mood   ?  ? Treatment Provided  ? Treatment provided Cognitive-Linquistic   ?  ? Cognitive-Linquistic Treatment  ? Treatment focused on Cognition;Patient/family/caregiver education   ? Skilled Treatment Jeris Penta has implemented morning schedule to A with energy conservation. We ID'd limiting conversations to 10 minutes and requesting most information in email form to compensate for slow processing of conversation/verbal information. Zoraid reports anxiety and depression due to reduced attention. Instructed her to reach out to her MD about this. She continues to required usual  mod A to  implement strategies to reduce multitasking including sitting at a table or desk when on the phone instead of talking and trying to complete tasks at the same time. She is completing some mindfulness with guided relaxation on YouTube. Suggested she do this when she feels anxious   ?  ? Assessment / Recommendations / Plan  ? Plan Continue with current plan of care   ?  ? Progression Toward Goals  ? Progression toward goals Progressing toward goals   ? ?  ?  ? ?  ? ? ? SLP Education - 07/25/21 1115   ? ? Education Details compensations for attention, memory, processing   ? Person(s) Educated Patient   ? Methods Explanation;Demonstration;Verbal cues;Handout   ? Comprehension Verbalized understanding;Verbal cues required   ? ?  ?  ? ?  ? ? ? SLP Short Term Goals - 07/25/21 1117   ? ?  ? SLP SHORT TERM GOAL #1  ? Title Pt will utilize external/internal memory compensations to aid daily functioning given occasional min A over 2 sessions   ? Baseline 07-04-21, 07-21-21   ? Time 1   ? Period Weeks   ? Status Achieved   ?  ? SLP SHORT TERM GOAL #2  ? Title Pt will maintain sustained and focused attention during 15+ minute conversation with used of  targeted strategies given occasional A over 2 sessions   ? Baseline 07-04-21, 07-21-21   ? Time 1   ? Period Weeks   ? Status Achieved   ?  ? SLP SHORT TERM GOAL #3  ? Title Pt will demonstrate ability to alternate attention successfully between conversation/structured tasks given occasional min A over 2 sessions   ? Time 1   ? Period Weeks   ? Status On-going   ?  ? SLP SHORT TERM GOAL #4  ? Title Pt will utilize anomia compensations in 30+ minute conversations given occasional min A over 2 sessions   ? Baseline 07-04-21, 07-21-21   ? Time 1   ? Period Weeks   ? Status Achieved   ? ?  ?  ? ?  ? ? ? SLP Long Term Goals - 07/25/21 1117   ? ?  ? SLP LONG TERM GOAL #1  ? Title Pt will utilize external/internal memory compensations to aid daily functioning given rare min A over 2 sessions   ?  Time 3   ? Period Weeks   ? Status On-going   ?  ? SLP LONG TERM GOAL #2  ? Title Pt will maintain sustained and focused attention during 30+ minute conversations given rare min A over 2 sessions   ? Time 3   ? Period Weeks   ? Status On-going   ?  ? SLP LONG TERM GOAL #3  ? Title Pt will accurately manage medications with no missed doses reported > 2 weeks given rare min A from family   ? Time 3   ? Period Weeks   ? Status On-going   ?  ? SLP LONG TERM GOAL #4  ? Title Pt will utilize anomia compensations in work presentations/business calls given rare min A over 2 sessions   ? Time 3   ? Period Weeks   ? Status On-going   ?  ? SLP LONG TERM GOAL #5  ? Title Pt will report improved cognitive linguistic functioning via PROM by 5 points at last ST session   ? Baseline Cognitive Function: 70   ? Time 3   ? Period Weeks   ? Status On-going   ? ?  ?  ? ?  ? ? ? Plan - 07/25/21 1116   ? ? Clinical Impression Statement Improvements in moderate cognitive communication impairments recently plateued by recent contraction of COVID-19. Conducted ongoing education and training of compensations for attention, memory, and sensory over-stimulation. Pt demonstrates improving awareness and increasing independent modifications to accomodate for current cognitive linguistic challenges. Continue skilled ST to return to PLOF, maximize professional success and QOL   ? Speech Therapy Frequency 2x / week   ? Duration 8 weeks   ? Treatment/Interventions Compensatory strategies;Functional tasks;Cueing hierarchy;Environmental controls;Cognitive reorganization;Multimodal communcation approach;Language facilitation;Compensatory techniques;Internal/external aids;SLP instruction and feedback   ? Potential to Achieve Goals Good   ? ?  ?  ? ?  ? ? ?Patient will benefit from skilled therapeutic intervention in order to improve the following deficits and impairments:   ?Cognitive communication deficit ? ? ? ?Problem List ?Patient Active Problem List   ? Diagnosis Date Noted  ? Neck pain 06/09/2021  ? Post concussion syndrome 06/05/2021  ? Syncope 06/05/2021  ? 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 Head trauma 06/05/2021  ? Elevated troponin   ? LBBB (left bundle branch block)   ? Acute left-sided weakness 03/17/2020  ? Occipital headache 06/11/2018  ? left sided  temporal headache 06/11/2018  ? Occipital neuralgia 06/11/2018  ? Vitiligo 11/14/2016  ? Hypertension 07/13/2014  ? Migraine 07/13/2014  ? ? ?Roxie Kreeger, Radene JourneyLaura Ann, CCC-SLP ?07/25/2021, 11:18 AM ? ?[facility] ?[facility] ?[address] ?[place], [place], [address] ?Phone: [phone]   Fax:  [phone] ? ? ?Name: Florina OuZoraida G Whitmire ?MRN: [id number] ?Date of Birth: 09/05/1970 ? ?

## 2021-07-25 NOTE — Therapy (Signed)
?OUTPATIENT PHYSICAL THERAPY TREATMENT NOTE ? ? ?Patient Name: Sharon Friedman ?MRN: [id number] ?DOB:05/18/1970, 51 y.o., female ?Today's Date: 07/25/2021 ? ?PCP: Michael Boston, MD ?REFERRING PROVIDER: Melvenia Beam, MD ? ? PT End of Session - 07/25/21 0936   ? ? Visit Number 5   ? Number of Visits 16   ? Date for PT Re-Evaluation 09/09/21   ? Authorization Type [employer], $70 Copay, VL: 30 (combined) auth reqd for addl visits   ? PT Start Time [phone]   Pt arriving late  ? PT Stop Time 1014   ? PT Time Calculation (min) 38 min   ? Activity Tolerance Patient limited by pain;Other (comment)   Limited by Dizziness  ? Behavior During Therapy [facility] for tasks assessed/performed   ? ?  ?  ? ?  ? ? ?Past Medical History:  ?Diagnosis Date  ? Hypertension   ? Migraines   ? ?Past Surgical History:  ?Procedure Laterality Date  ? APPENDECTOMY    ? BREAST ENHANCEMENT SURGERY    ? DILATION AND CURETTAGE OF UTERUS    ? EYE SURGERY    ? lifted cornea (as child done for astigmatism)  ? ?Patient Active Problem List  ? Diagnosis Date Noted  ? Neck pain 06/09/2021  ? Post concussion syndrome 06/05/2021  ? Syncope 06/05/2021  ? 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 Head trauma 06/05/2021  ? Elevated troponin   ? LBBB (left bundle branch block)   ? Acute left-sided weakness 03/17/2020  ? Occipital headache 06/11/2018  ? left sided temporal headache 06/11/2018  ? Occipital neuralgia 06/11/2018  ? Vitiligo 11/14/2016  ? Hypertension 07/13/2014  ? Migraine 07/13/2014  ? ? ?REFERRING DIAG: S09.90XA (ICD-10-CM) - Traumatic injury of head, initial encounter R53.83 (ICD-10-CM) - Other fatigue S13.4XXA (ICD-10-CM) - Whiplash injury to neck, initial encounter F07.81 (ICD-10-CM) - Post concussion syndrome M54.81 (ICD-10-CM) - Bilateral occipital neuralgia  ? ?THERAPY DIAG:  ?Dizziness and giddiness ? ?Abnormal posture ? ?Muscle weakness (generalized) ? ?Pain in left arm ? ?PERTINENT HISTORY:  ? Hypertension    ? Migraines    ?   ?PRECAUTIONS: Fall;Other (comment)  ?  ?Precaution Comments ?Fall, Migraines, HTN  ?  ?  ? ?SUBJECTIVE: Patient reports that she feels like she has regressed since having COVID, especially noted that she has struggled with the gaze stabilization. Reports the neck stretches are helping to ease the pain. Reports the pain continues to go down into the arm, but reports the tingling has increased. Reports that she continues to have dizziness/nausea, No other new changes/complaints.  ? ?PAIN:  ?Are you having pain? Yes; reports 5/10 pain, Left Neck Region, Pulsing/Tenderness, Constant.  ? ? ?TODAY'S TREATMENT: ? ? ?Cervical AROM/PROM: ? ?A/PROM AROM (06/10/21) A/PROM (deg) ?07/25/2021  ?Flexion 42 37 (increased mod - severe pain on the L side)   ?Extension 38 38 (denies pain)  ?Right lateral flexion 28 35 (feels like a stretch)   ?Left lateral flexion 32 35 (moderate pain with completion)  ?Right rotation 44 42 (stretching)  ?Left rotation 46 39 (moderate pain with completion)  ?(Blank rows = not tested) ? ?Motion Sensitivity Quotient ? ?Intensity: 0 = none, 1 = Lightheaded, 2 = Mild, 3 = Moderate, 4 = Severe, 5 = Vomiting ? Intensity  ?1. Sitting to supine  0  ?2. Supine to L side  3  ?3. Supine to R side  2 (also feels a lot of pressure in head)  ?4. Supine to sitting  3 (reports significant dizziness)  ?5. L Hallpike-Dix   ?6. Up from L    ?7. R Hallpike-Dix   ?8. Up from R    ?9. Sitting, head  ?tipped to L knee 2  ?10. Head up from L  ?knee 4  ?11. Sitting, head  ?tipped to R knee 3  ?12. Head up from R  ?knee 4  ?13. Sitting head turns x5 4 (able to complete 3 reps, had to reach back with support, frequent blinking noted)   ?14.Sitting head nods x5 3 (able to complete all reps, very jerky neck movement noted)  ?15. In stance, 180?  ?turn to L  2 (unsteadiness noted, CGA)  ?16. In stance, 180?  ?turn to R 2 (unsteadiness noted, CGA)  ?- Very Slow En Bloc Movements noted with MSQ Testings, with frequent rest breaks due  to symptoms ? ?Self Care:  ? - PT providing extensive education regarding symptom threshold with exercises, and regression due to COVID. Minimal to no progress made at this time due to COVID and inconsistency with PT services. PT encouraging continued use of HEP and will plan to review/progress as tolerated at next session.  ?  ? ?PATIENT EDUCATION: ?Education details: Progress toward LTGs; Minimal progress due to inconsistency, will need to be more consistent with future appointments. Updated POC.  ?Person educated: Patient ?Education method: Explanation, Demonstration, and Tactile cues; pt was initially performing incorrectly with too large ROM and not keeping eyes on target ?Education comprehension: returned demonstration and verbal cues required ? ? ?HOME EXERCISE PROGRAM: ?Medbridge [id number] ? ? PT Short Term Goals - 06/27/21 1930   ? ?  ? PT SHORT TERM GOAL #1  ? Title Patient will be indepdent with inital HEP (All STGs Due: 07/08/21)   ? Baseline to be established   ? Time 3   ? Period Weeks   ? Status New   ? Target Date 07/08/21   ?  ? PT SHORT TERM GOAL #2  ? Title Vestibular Asessment TBA and LTG to be set as applicable   ? Baseline TBA   ? Time 3   ? Period Weeks   ? Status New   ?  ? PT SHORT TERM GOAL #3  ? Title Pt will report 25% improvement in L shoulder/arm pain with functional activities   ? Baseline significant pain limiting activities; no improvement reported  ? Time 3   ? Period Weeks; Deferred  ? Status New   ? ?  ?  ? ?  ? ? ? PT Long Term Goals - 06/27/21 1931   ? ?  ? PT LONG TERM GOAL #1  ? Title Pt will be independent with final/progressive HEP (ALL LTGs Due: 08/05/21)   ? Baseline no HEP established; not completing consistently  ? Time 6   ? Period Weeks   ? Status On-Going  ? Target Date 08/05/21   ?  ? PT LONG TERM GOAL #2  ? Title Pt will improve all cervical AROM by 5 deg and report </= 2/10 pain with neck movement   ? Baseline see flowsheet; minimal to no improvements (see above)   ?  Time 6   ? Period Weeks   ? Status Not Met  ?  ? PT LONG TERM GOAL #3  ? Title Pt will report return to cycling/exercise program and report ability to complete >/= 15 minutes without increase in symptoms   ? Baseline not currently exercising; tried to get on  bike unable to due to dizziness  ? Time 6   ? Period Weeks   ? Status New; Not Met  ?  ? PT LONG TERM GOAL #4  ? Title LTG to be set for Vestibular Assesment   ? ?  ?  ? ?  ? ?SHORT TERM GOALS: Target date: 08/15/2021 ? ?FGA TBA and LTG to be set as applicable ?Baseline: TBA ?Goal status: INITIAL ? ?2.  Pt will report 25% improvement in L shoulder/arm pain with functional activities ?Baseline: no improvement; significant pain limiting activities ?Goal status: INITIAL ? ?3.  Pt will report </= 3/5 for all movements on MSQ to indicate improvement in motion sensitivity and improved activity tolerance.  ?Baseline: 2-4 ?Goal status: INITIAL ? ? ?LONG TERM GOALS: Target date: 09/09/2021 ? ?Pt will be independent with final/progressive HEP for cervical/vestibular/balance ?Baseline: HEP established, completing inconsistently ?Goal status: IN PROGRESS ? ?2.  Pt will improve all cervical AROM by 5 deg and report </= 2/10 pain with neck movement ?Baseline:  ?A/PROM AROM (06/10/21) A/PROM (deg) ?07/25/2021  ?Flexion 42 37 (increased mod - severe pain on the L side)   ?Extension 38 38 (denies pain)  ?Right lateral flexion 28 35 (feels like a stretch)   ?Left lateral flexion 32 35 (moderate pain with completion)  ?Right rotation 44 42 (stretching)  ?Left rotation 46 39 (moderate pain with completion)  ? ?Goal status: IN PROGRESS ? ?3.  Pt will report return to cycling/exercise program and report ability to complete >/= 10  minutes with reports of </= 5/10 dizziness ?Baseline: not currently exercising ?Goal status: IN PROGRESS ? ?4.  Pt will report </= 2/5 for all movements on MSQ to indicate improvement in motion sensitivity and improved activity tolerance.  ?Baseline: 2-4/5 for  all components ?Goal status: INITIAL ? ?5.  Pt will improve FGA by 4 points from baseline to demonstrate improved balance and reduced fall risk ?Baseline: TBA ?Goal status: INITIAL ? ? ? ? ? Plan - 07/25/21 101

## 2021-07-25 NOTE — Patient Instructions (Addendum)
? ? ?  Your processing is slightly slower in conversation which is rapid  ? ?Great job giving yourself some more time to solve the problem ? ?Ask for information in e mails or texts.  "Thank you for this information. Please e mail me with your concerns and I will get back to you" ? ?Remember speech/conversation is fast and fleeting -- the words don't stick around  ? ?Being mindful about not multitasking - rubber band ? ?When you are on the phone, sit down and focus on the conversation ? ?Set your top 3 priorities each day ? ?Maybe reach out to [facility] about anxiety/depression  or ask about Ritalin or Adderral to temporarily help with attention/distraction ? ?Great job trying to read, recall and limit your time and not push through ? ?Gratitude journal ? ?On IG ? ?Complete concussions ?Concussion_doc ?[facility] ?

## 2021-07-28 ENCOUNTER — Ambulatory Visit: Payer: 59

## 2021-07-28 ENCOUNTER — Ambulatory Visit: Payer: 59 | Admitting: Physical Therapy

## 2021-07-28 DIAGNOSIS — R42 Dizziness and giddiness: Secondary | ICD-10-CM | POA: Diagnosis not present

## 2021-07-28 DIAGNOSIS — R41841 Cognitive communication deficit: Secondary | ICD-10-CM

## 2021-07-28 NOTE — Patient Instructions (Signed)
Gaze Stabilization: Sitting ? ? ? ?Keeping eyes on target on wall 3 feet away, and move head side to side for _15_ seconds. Repeat while moving head up and down for _15_ seconds.  Rest between sets.  Let any dizziness or nausea settle down before starting next repetition. ?Do __2_ sessions per day.   ? ? ?2. 3 Point Turn: ?  ?Standing, move your eyes and head to the left or right, find a target to look at with your eyes, and then turn your body in the same direction.  Perform eyes/head movement first before body when turning.  Perform in standing and when walking/turning.  ?

## 2021-07-28 NOTE — Therapy (Signed)
?OUTPATIENT PHYSICAL THERAPY TREATMENT NOTE ? ? ?Patient Name: Sharon Friedman ?MRN: [id number] ?DOB:05-27-1970, 51 y.o., female ?Today's Date: 07/28/2021 ? ?PCP: Michael Boston, MD ?REFERRING PROVIDER: Melvenia Beam, MD ? ? PT End of Session - 07/28/21 1021   ? ? Visit Number 6   ? Number of Visits 16   ? Date for PT Re-Evaluation 09/09/21   ? Authorization Type [employer], $70 Copay, VL: 30 (combined) auth reqd for addl visits   ? PT Start Time 1020   ? PT Stop Time 1105   ? PT Time Calculation (min) 45 min   ? Activity Tolerance Patient tolerated treatment well   ? Behavior During Therapy [facility] for tasks assessed/performed   ? ?  ?  ? ?  ? ? ? ?Past Medical History:  ?Diagnosis Date  ? Hypertension   ? Migraines   ? ?Past Surgical History:  ?Procedure Laterality Date  ? APPENDECTOMY    ? BREAST ENHANCEMENT SURGERY    ? DILATION AND CURETTAGE OF UTERUS    ? EYE SURGERY    ? lifted cornea (as child done for astigmatism)  ? ?Patient Active Problem List  ? Diagnosis Date Noted  ? Neck pain 06/09/2021  ? Post concussion syndrome 06/05/2021  ? Syncope 06/05/2021  ? 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 Head trauma 06/05/2021  ? Elevated troponin   ? LBBB (left bundle branch block)   ? Acute left-sided weakness 03/17/2020  ? Occipital headache 06/11/2018  ? left sided temporal headache 06/11/2018  ? Occipital neuralgia 06/11/2018  ? Vitiligo 11/14/2016  ? Hypertension 07/13/2014  ? Migraine 07/13/2014  ? ? ?REFERRING DIAG: S09.90XA (ICD-10-CM) - Traumatic injury of head, initial encounter R53.83 (ICD-10-CM) - Other fatigue S13.4XXA (ICD-10-CM) - Whiplash injury to neck, initial encounter F07.81 (ICD-10-CM) - Post concussion syndrome M54.81 (ICD-10-CM) - Bilateral occipital neuralgia  ? ?THERAPY DIAG:  ?Dizziness and giddiness ? ?PERTINENT HISTORY:  ? Hypertension    ? Migraines    ?  ?PRECAUTIONS: Fall;Other (comment)  ?  ?Precaution Comments ?Fall, Migraines, HTN  ?  ?SUBJECTIVE:  Pt reports feeling more  tired and dizzy today but pt slept well last night.  Still experiencing brain fog after COVID.  Pt coming to PT after speech therapy today.   ? ?PAIN:  ?Are you having pain? Yes; reports 5/10 pain, Left Neck Region, Pulsing/Tenderness, Constant.  ? ? ?TODAY'S TREATMENT: ? ? 07/28/21: ?Continued to educate pt on symptom threshold monitoring and purpose of bringing on small amounts of symptoms for brain adaptation and habituation but not pushing past minimal threshold level.   ? ?Downgraded VOR to sitting to improve tolerance and keep symptoms mild.  Pt reported mild nausea but symptoms improved quickly. ?Gaze Stabilization: Sitting ? ? ? ?Keeping eyes on target on wall 3 feet away, and move head side to side for _15_ seconds. Repeat while moving head up and down for _15_ seconds.  Rest between sets.  Let any dizziness or nausea settle down before starting next repetition. ?Do __2_ sessions per day.   ? ? ?2. 3 Point Turn with visual target to R, L, and central - performed statically and then added spotting to ambulation with R and L turns.  Provided visual and verbal cues for technique and sequencing. ?  ?Standing, move your eyes and head to the left or right, find a target to look at with your eyes, and then turn your body in the same direction.  Perform eyes/head movement first  before body when turning.  Perform in standing and when walking/turning.   ? ?3. Stretching  ?  Reviewed levator stretch for L side and added to Jansen. ?   ? ?07/25/21: ?Cervical AROM/PROM: ? ?A/PROM AROM (06/10/21) A/PROM (deg) ?07/28/2021  ?Flexion 42 37 (increased mod - severe pain on the L side)   ?Extension 38 38 (denies pain)  ?Right lateral flexion 28 35 (feels like a stretch)   ?Left lateral flexion 32 35 (moderate pain with completion)  ?Right rotation 44 42 (stretching)  ?Left rotation 46 39 (moderate pain with completion)  ?(Blank rows = not tested) ? ?Motion Sensitivity Quotient ? ?Intensity: 0 = none, 1 = Lightheaded, 2 =  Mild, 3 = Moderate, 4 = Severe, 5 = Vomiting ? Intensity  ?1. Sitting to supine  0  ?2. Supine to L side  3  ?3. Supine to R side  2 (also feels a lot of pressure in head)  ?4. Supine to sitting  3 (reports significant dizziness)  ?5. L Hallpike-Dix   ?6. Up from L    ?7. R Hallpike-Dix   ?8. Up from R    ?9. Sitting, head  ?tipped to L knee 2  ?10. Head up from L  ?knee 4  ?11. Sitting, head  ?tipped to R knee 3  ?12. Head up from R  ?knee 4  ?13. Sitting head turns x5 4 (able to complete 3 reps, had to reach back with support, frequent blinking noted)   ?14.Sitting head nods x5 3 (able to complete all reps, very jerky neck movement noted)  ?15. In stance, 180?  ?turn to L  2 (unsteadiness noted, CGA)  ?16. In stance, 180?  ?turn to R 2 (unsteadiness noted, CGA)  ?- Very Slow En Bloc Movements noted with MSQ Testings, with frequent rest breaks due to symptoms ? ?Self Care:  ? - PT providing extensive education regarding symptom threshold with exercises, and regression due to COVID. Minimal to no progress made at this time due to COVID and inconsistency with PT services. PT encouraging continued use of HEP and will plan to review/progress as tolerated at next session.  ?  ? ?PATIENT EDUCATION: ?Education details: downgraded VOR to sitting due to regression after COVID, visual spotting when performing turns to improve balance and decrease dizziness, levator stretch, adding dry needling to plan and performing next week. ?Person educated: Patient ?Education method: Explanation, Demonstration, and Handouts;  ?Education comprehension: returned demonstration and verbal cues required ? ? ?HOME EXERCISE PROGRAM: ?Medbridge [id number] ? ? ?SHORT TERM GOALS: Target date: 08/15/2021 ? ?FGA TBA and LTG to be set as applicable ?Baseline: TBA ?Goal status: INITIAL ? ?2.  Pt will report 25% improvement in L shoulder/arm pain with functional activities ?Baseline: no improvement; significant pain limiting activities ?Goal status:  INITIAL ? ?3.  Pt will report </= 3/5 for all movements on MSQ to indicate improvement in motion sensitivity and improved activity tolerance.  ?Baseline: 2-4 ?Goal status: INITIAL ? ? ?LONG TERM GOALS: Target date: 09/09/2021 ? ?Pt will be independent with final/progressive HEP for cervical/vestibular/balance ?Baseline: HEP established, completing inconsistently ?Goal status: IN PROGRESS ? ?2.  Pt will improve all cervical AROM by 5 deg and report </= 2/10 pain with neck movement ?Baseline:  ?A/PROM AROM (06/10/21) A/PROM (deg) ?07/25/2021  ?Flexion 42 37 (increased mod - severe pain on the L side)   ?Extension 38 38 (denies pain)  ?Right lateral flexion 28 35 (feels like a stretch)   ?  Left lateral flexion 32 35 (moderate pain with completion)  ?Right rotation 44 42 (stretching)  ?Left rotation 46 39 (moderate pain with completion)  ? ?Goal status: IN PROGRESS ? ?3.  Pt will report return to cycling/exercise program and report ability to complete >/= 10  minutes with reports of </= 5/10 dizziness ?Baseline: not currently exercising ?Goal status: IN PROGRESS ? ?4.  Pt will report </= 2/5 for all movements on MSQ to indicate improvement in motion sensitivity and improved activity tolerance.  ?Baseline: 2-4/5 for all components ?Goal status: INITIAL ? ?5.  Pt will improve FGA by 4 points from baseline to demonstrate improved balance and reduced fall risk ?Baseline: TBA ?Goal status: INITIAL ? ? ? ? ? Plan - 07/25/21 1016   ? ? Clinical Impression Statement  Pt demonstrated improved tolerance for VOR when downgraded to sitting and was able to continue to perform with minimal symptoms and quick resolution of symptoms.  Provided pt with visual spotting to improve safety and balance with body turns.  Added levator stretch due to ongoing symptoms of neck/shoulder pain in levator referral pattern.  No significant increase in symptoms at end of session.  Will continue to address and progress towards LTG.  ? Personal Factors and  Comorbidities Comorbidity 2   ? Comorbidities Migraines, HTN   ? Examination-Activity Limitations Carry;Lift   ? Examination-Participation Restrictions Occupation;Community Activity;Cleaning   ? Stability/Clini

## 2021-07-28 NOTE — Patient Instructions (Signed)
6 Pillars of Brain Health ?Physical Exercise ?Food & Nutrition ?Medical Health ?Sleep & Relaxation ?Mental Fitness ?Social Interaction ? ?10 brain-healthy food groups ?Green leafy vegetables ?Other vegetables ?Nuts ?Berries (especially blueberries and strawberries) ?Beans ?Whole grains ?Fish (see note below) ?Poultry ?Olive oil ?Red wine ? ?Do you own research - look for 10-15 minutes today and tomorrow. Write out a grocery list/meal prep what you want to eat for the week  ?

## 2021-07-28 NOTE — Therapy (Signed)
[facility] ?[address] ?[place], [place], [address] ?Phone: [phone]   Fax:  [phone] ? ?Speech Language Pathology Treatment ? ?Patient Details  ?Name: Sharon Friedman ?MRN: [id number] ?Date of Birth: 11-09-1970 ?Referring Provider (SLP): Anson Fret, MD ? ? ?Encounter Date: 07/28/2021 ? ? End of Session - 07/28/21 0944   ? ? Visit Number 8   ? Number of Visits 17   ? Date for SLP Re-Evaluation 08/12/21   ? Authorization Type [employer], $70 Copay, VL: 30 (combined) auth req'd for addl visits   ? SLP Start Time [phone]   pt arrived late  ? SLP Stop Time  1015   ? SLP Time Calculation (min) 34 min   ? Activity Tolerance Patient tolerated treatment well   ? ?  ?  ? ?  ? ? ?Past Medical History:  ?Diagnosis Date  ? Hypertension   ? Migraines   ? ? ?Past Surgical History:  ?Procedure Laterality Date  ? APPENDECTOMY    ? BREAST ENHANCEMENT SURGERY    ? DILATION AND CURETTAGE OF UTERUS    ? EYE SURGERY    ? lifted cornea (as child done for astigmatism)  ? ? ?There were no vitals filed for this visit. ? ? Subjective Assessment - 07/28/21 0943   ? ? Subjective "I'm having a good morning"   ? Currently in Pain? Yes   ? Pain Score 3    ? Pain Location Head   ? ?  ?  ? ?  ? ? ? ? ? ? ? ? ADULT SLP TREATMENT - 07/28/21 0943   ? ?  ? General Information  ? Behavior/Cognition Alert;Cooperative;Pleasant mood   ?  ? Treatment Provided  ? Treatment provided Cognitive-Linquistic   ?  ? Cognitive-Linquistic Treatment  ? Treatment focused on Cognition;Patient/family/caregiver education   ? Skilled Treatment Increased mindfulness and awareness reported. Discussed with friend who helped patient develop plan for next month as pt has been frustrated and felt lack of accomplishment compared to baseline. SLP educated recommendations to aid implementation of discussed plan and ability to modify plan if that becomes overwhelming or unattainable.   ?  ? Assessment /  Recommendations / Plan  ? Plan Continue with current plan of care   ?  ? Progression Toward Goals  ? Progression toward goals Progressing toward goals   ? ?  ?  ? ?  ? ? ? SLP Education - 07/28/21 1300   ? ? Education Details compensations   ? Person(s) Educated Patient   ? Methods Explanation;Demonstration;Verbal cues   ? Comprehension Verbalized understanding;Returned demonstration;Need further instruction   ? ?  ?  ? ?  ? ? ? SLP Short Term Goals - 07/28/21 0944   ? ?  ? SLP SHORT TERM GOAL #1  ? Title Pt will utilize external/internal memory compensations to aid daily functioning given occasional min A over 2 sessions   ? Baseline 07-04-21, 07-21-21   ? Status Achieved   ?  ? SLP SHORT TERM GOAL #2  ? Title Pt will maintain sustained and focused attention during 15+ minute conversation with used of targeted strategies given occasional A over 2 sessions   ? Baseline 07-04-21, 07-21-21   ? Status Achieved   ?  ? SLP SHORT TERM GOAL #3  ? Title Pt will demonstrate ability to alternate attention successfully between conversation/structured tasks given occasional min A over 2 sessions   ? Status Deferred   ?  ?  SLP SHORT TERM GOAL #4  ? Title Pt will utilize anomia compensations in 30+ minute conversations given occasional min A over 2 sessions   ? Baseline 07-04-21, 07-21-21   ? Status Achieved   ? ?  ?  ? ?  ? ? ? SLP Long Term Goals - 07/28/21 0946   ? ?  ? SLP LONG TERM GOAL #1  ? Title Pt will utilize external/internal memory compensations to aid daily functioning given rare min A over 2 sessions   ? Baseline 07-28-21   ? Time 3   ? Period Weeks   ? Status On-going   ?  ? SLP LONG TERM GOAL #2  ? Title Pt will maintain sustained and focused attention during 30+ minute conversations given rare min A over 2 sessions   ? Baseline 07-28-21   ? Time 3   ? Period Weeks   ? Status On-going   ?  ? SLP LONG TERM GOAL #3  ? Title Pt will accurately manage medications with no missed doses reported > 2 weeks given rare min A from  family   ? Time 3   ? Period Weeks   ? Status On-going   ?  ? SLP LONG TERM GOAL #4  ? Title Pt will utilize anomia compensations in work presentations/business calls given rare min A over 2 sessions   ? Time 3   ? Period Weeks   ? Status On-going   ?  ? SLP LONG TERM GOAL #5  ? Title Pt will report improved cognitive linguistic functioning via PROM by 5 points at last ST session   ? Baseline Cognitive Function: 70   ? Time 3   ? Period Weeks   ? Status On-going   ? ?  ?  ? ?  ? ? ? Plan - 07/28/21 0944   ? ? Clinical Impression Statement Improvements in moderate cognitive communication impairments recently plateued by recent contraction of COVID-19. Conducted ongoing education and training of compensations for attention, memory, and sensory over-stimulation. Pt demonstrates improving awareness and increasing independent modifications to accomodate for current cognitive linguistic challenges. Continue skilled ST to return to PLOF, maximize professional success and QOL   ? Speech Therapy Frequency 2x / week   ? Duration 8 weeks   ? Treatment/Interventions Compensatory strategies;Functional tasks;Cueing hierarchy;Environmental controls;Cognitive reorganization;Multimodal communcation approach;Language facilitation;Compensatory techniques;Internal/external aids;SLP instruction and feedback   ? Potential to Achieve Goals Good   ? ?  ?  ? ?  ? ? ?Patient will benefit from skilled therapeutic intervention in order to improve the following deficits and impairments:   ?Cognitive communication deficit ? ? ? ?Problem List ?Patient Active Problem List  ? Diagnosis Date Noted  ? Neck pain 06/09/2021  ? Post concussion syndrome 06/05/2021  ? Syncope 06/05/2021  ? 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 Head trauma 06/05/2021  ? Elevated troponin   ? LBBB (left bundle branch block)   ? Acute left-sided weakness 03/17/2020  ? Occipital headache 06/11/2018  ? left sided temporal headache 06/11/2018  ? Occipital neuralgia  06/11/2018  ? Vitiligo 11/14/2016  ? Hypertension 07/13/2014  ? Migraine 07/13/2014  ? ? ?Gracy Racer, CCC-SLP ?07/28/2021, 1:01 PM ? ?[facility] ?[address] ?[place], [place], [address] ?Phone: [phone]   Fax:  [phone] ? ? ?Name: Sharon Friedman ?MRN: [id number] ?Date of Birth: 07/29/1970 ? ?

## 2021-08-01 ENCOUNTER — Ambulatory Visit: Payer: Medicaid Other | Attending: Neurology | Admitting: Physical Therapy

## 2021-08-01 ENCOUNTER — Ambulatory Visit: Payer: Medicaid Other

## 2021-08-01 DIAGNOSIS — R293 Abnormal posture: Secondary | ICD-10-CM | POA: Insufficient documentation

## 2021-08-01 DIAGNOSIS — R42 Dizziness and giddiness: Secondary | ICD-10-CM | POA: Insufficient documentation

## 2021-08-01 DIAGNOSIS — R41841 Cognitive communication deficit: Secondary | ICD-10-CM | POA: Insufficient documentation

## 2021-08-01 DIAGNOSIS — M6281 Muscle weakness (generalized): Secondary | ICD-10-CM | POA: Diagnosis present

## 2021-08-01 DIAGNOSIS — M79602 Pain in left arm: Secondary | ICD-10-CM | POA: Insufficient documentation

## 2021-08-01 NOTE — Therapy (Signed)
?OUTPATIENT PHYSICAL THERAPY TREATMENT NOTE ? ? ?Patient Name: Sharon Friedman ?MRN: [id number] ?DOB:1970-09-08, 51 y.o., female ?Today's Date: 08/01/2021 ? ?PCP: Melida Quitter, MD ?REFERRING PROVIDER: Anson Fret, MD ? ? PT End of Session - 08/01/21 0934   ? ? Visit Number 7   ? Number of Visits 16   ? Date for PT Re-Evaluation 09/09/21   ? Authorization Type [employer], $70 Copay, VL: 30 (combined) auth reqd for addl visits   ? PT Start Time [phone]   ? PT Stop Time 1015   ? PT Time Calculation (min) 42 min   ? Activity Tolerance Patient tolerated treatment well   ? Behavior During Therapy [facility] for tasks assessed/performed   ? ?  ?  ? ?  ? ? ? ?Past Medical History:  ?Diagnosis Date  ? Hypertension   ? Migraines   ? ?Past Surgical History:  ?Procedure Laterality Date  ? APPENDECTOMY    ? BREAST ENHANCEMENT SURGERY    ? DILATION AND CURETTAGE OF UTERUS    ? EYE SURGERY    ? lifted cornea (as child done for astigmatism)  ? ?Patient Active Problem List  ? Diagnosis Date Noted  ? Neck pain 06/09/2021  ? Post concussion syndrome 06/05/2021  ? Syncope 06/05/2021  ? 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 Head trauma 06/05/2021  ? Elevated troponin   ? LBBB (left bundle branch block)   ? Acute left-sided weakness 03/17/2020  ? Occipital headache 06/11/2018  ? left sided temporal headache 06/11/2018  ? Occipital neuralgia 06/11/2018  ? Vitiligo 11/14/2016  ? Hypertension 07/13/2014  ? Migraine 07/13/2014  ? ? ?REFERRING DIAG: S09.90XA (ICD-10-CM) - Traumatic injury of head, initial encounter R53.83 (ICD-10-CM) - Other fatigue S13.4XXA (ICD-10-CM) - Whiplash injury to neck, initial encounter F07.81 (ICD-10-CM) - Post concussion syndrome M54.81 (ICD-10-CM) - Bilateral occipital neuralgia  ? ?THERAPY DIAG:  ?Dizziness and giddiness ? ?PERTINENT HISTORY:  ? Hypertension    ? Migraines    ?  ?PRECAUTIONS: Fall;Other (comment)  ?  ?Precaution Comments ?Fall, Migraines, HTN  ?  ?SUBJECTIVE:  Pt reports feeling better  than she was last Thurs. - has more energy and dizziness is less today; rates dizziness 3/10 today ? ?PAIN:  ?Are you having pain? Yes; reports 6/10 intensity in Lt upper trap and levator - lateral side of neck; soreness in neck; numbess and tingling in left fingers - states the tingling gradually becomes pain; mostly constant  ? ?TODAY'S TREATMENT: ? ?08-01-21: ? ?NeuroRe-ed: ? ?Pt performed x1 viewing exercise in seated position approximately 4' away from target - 2 reps horizontal and vertical -  horizontal 22.19 secs 1st rep, 13.5 secs 2nd rep:  vertical 27.6 secs 1st rep and 17.4 secs 2nd rep with c/o nausea and dizziness ? ?Pt performed standing in corner on floor - performed small trunk rotations with hand as targets 5 reps to each side, small turn; progressed to larger turn with pt touching wall with each hand 3 reps only due to c/o increased dizziness. ? ?Pt performed standing with wide BOS on floor with EC 15 secs with 2 LOB initially, requiring use of wall for recovery; pt performed standing with feet together with EC with UE support on chair prn for assist with balance 15 sec hold ? ?Pt performed standing with feet shoulder width apart on floor - hands clasped together - made 5 circles clockwise following with eyes and head moving for improved VOR; 3 circles counterclockwise  - pt  had mild c/o nausea and needed short seated rest period to allow dizziness to subside ? ?Pt performed marching in place EO 5 reps; EC 5 reps with UE support on chair prn with CGA ? ?Pt performed amb. 30' x 2 reps with slow horizontal head turns; slow turn around and stop to minimize dizziness; pt then amb. 30' x 2 reps with focusing on object straight ahead to allow dizziness to subside ? ?   ? ?PATIENT EDUCATION: ?Education details:  [employer] VARADJDX;  pt instructed to continue x1 viewing exercise in seated position but try to increase to 60 secs as able; instructed pt to perform 1 rep and then do another rep later in the day  to allow time between reps to minimize dizziness and nausea (instructed not to do subsequent reps at same time)  ?Person educated: Patient ?Education method: Explanation, Demonstration, and Handouts;  ?Education comprehension: returned demonstration and verbal cues required ? ? ?HOME EXERCISE PROGRAM: ?[employer] VARADJDX (issued on 08-01-21); previous HEP [id number] ([employer])  ? ? ?SHORT TERM GOALS: Target date: 08/15/2021 ? ?FGA TBA and LTG to be set as applicable ?Baseline: TBA ?Goal status: INITIAL ? ?2.  Pt will report 25% improvement in L shoulder/arm pain with functional activities ?Baseline: no improvement; significant pain limiting activities ?Goal status: INITIAL ? ?3.  Pt will report </= 3/5 for all movements on MSQ to indicate improvement in motion sensitivity and improved activity tolerance.  ?Baseline: 2-4 ?Goal status: INITIAL ? ? ?LONG TERM GOALS: Target date: 09/09/2021 ? ?Pt will be independent with final/progressive HEP for cervical/vestibular/balance ?Baseline: HEP established, completing inconsistently ?Goal status: IN PROGRESS ? ?2.  Pt will improve all cervical AROM by 5 deg and report </= 2/10 pain with neck movement ?Baseline:  ?A/PROM AROM (06/10/21) A/PROM (deg) ?07/25/2021  ?Flexion 42 37 (increased mod - severe pain on the L side)   ?Extension 38 38 (denies pain)  ?Right lateral flexion 28 35 (feels like a stretch)   ?Left lateral flexion 32 35 (moderate pain with completion)  ?Right rotation 44 42 (stretching)  ?Left rotation 46 39 (moderate pain with completion)  ? ?Goal status: IN PROGRESS ? ?3.  Pt will report return to cycling/exercise program and report ability to complete >/= 10  minutes with reports of </= 5/10 dizziness ?Baseline: not currently exercising ?Goal status: IN PROGRESS ? ?4.  Pt will report </= 2/5 for all movements on MSQ to indicate improvement in motion sensitivity and improved activity tolerance.  ?Baseline: 2-4/5 for all components ?Goal status: INITIAL ? ?5.  Pt will  improve FGA by 4 points from baseline to demonstrate improved balance and reduced fall risk ?Baseline: TBA ?Goal status: INITIAL ? ? ? ? ? Plan - 07/25/21 1016   ? ? Clinical Impression Statement Pt able to perform x1 viewing with vertical head turns for longer duration than with horizontal head turns.  Pt reported more dizziness and nausea provoked with horizontal than with vertical head turns.  Pt instructed to continue this ex. In seated position and try to increase time to 60 secs as able and to hold on performing in standing position at this time.  Pt demonstrates decreased vestibular input in maintaining balance as evidenced by postural instability with standing on floor with EC and also with walking with horizontal head turns.  Pt required frequent rest periods between exercises to allow dizziness to subside but reported dizziness intensity during session </= 4/10.   Cont with POC.  ? Personal Factors and Comorbidities  Comorbidity 2   ? Comorbidities Migraines, HTN   ? Examination-Activity Limitations Carry;Lift   ? Examination-Participation Restrictions Occupation;Community Activity;Cleaning   ? Stability/Clinical Decision Making Stable/Uncomplicated   ? Rehab Potential Good   ? PT Frequency 2x / week   ? PT Duration 6 weeks   ? PT Treatment/Interventions ADLs/Self Care Home Management;Aquatic Therapy;Canalith Repostioning;Moist Heat;Cryotherapy;Therapeutic activities;Functional mobility training;Electrical Stimulation;Iontophoresis 4mg /ml Dexamethasone;Stair training;Gait training;Therapeutic exercise;Balance training;Neuromuscular re-education;Patient/family education;Manual techniques;Taping;Dry needling;Vestibular;Passive range of motion;Joint Manipulations;Spinal Manipulations   ? PT Next Visit Plan DN of levator and temporalis on L.  Progress VOR to standing if pt able to perform for 60 secs in seated position.  How is standing with EC (on floor) going?   Progress as tolerated by patient. Manual  Therapy. Habituation to Head Turns/Nods, 180 deg turns. Standing Balance.   ? Consulted and Agree with Plan of Care Patient   ? ?  ?  ? ?  ? ? ?ZOXWRU, EAVWU [id number], Avaleen Brownley Suzanne, PT  ?08/01/21, 6:49 PM ?[facility]

## 2021-08-01 NOTE — Patient Instructions (Signed)
For remembering names, think about repeating their name AND making a connection/association. ? ? ?[employer] - look into free online classes. Maybe discuss this with your PT next session.  ? ?

## 2021-08-01 NOTE — Therapy (Signed)
[facility] [place] ?[place], [place], [address] ?Phone: [phone]   Fax:  [phone] ? ?Speech Language Pathology Treatment ? ?Patient Details  ?Name: Sharon Friedman ?MRN: [id number] ?Date of Birth: Jan 01, 1971 ?Referring Provider (SLP): Melvenia Beam, MD ? ? ?Encounter Date: 08/01/2021 ? ? End of Session - 08/01/21 1021   ? ? Visit Number 9   ? Number of Visits 17   ? Date for SLP Re-Evaluation 08/12/21   ? Authorization Type [employer], $70 Copay, VL: 30 (combined) auth req'd for addl visits   ? SLP Start Time 1019   ? SLP Stop Time  1100   ? SLP Time Calculation (min) 41 min   ? Activity Tolerance Patient tolerated treatment well   ? ?  ?  ? ?  ? ? ?Past Medical History:  ?Diagnosis Date  ? Hypertension   ? Migraines   ? ? ?Past Surgical History:  ?Procedure Laterality Date  ? APPENDECTOMY    ? BREAST ENHANCEMENT SURGERY    ? DILATION AND CURETTAGE OF UTERUS    ? EYE SURGERY    ? lifted cornea (as child done for astigmatism)  ? ? ?There were no vitals filed for this visit. ? ? Subjective Assessment - 08/01/21 1020   ? ? Subjective "I am dizzy"   ? Currently in Pain? Yes   ? Pain Score 6    ? Pain Location Head   ? ?  ?  ? ?  ? ? ? ? ? ? ? ? ADULT SLP TREATMENT - 08/01/21 1020   ? ?  ? General Information  ? Behavior/Cognition Alert;Cooperative;Pleasant mood   ?  ? Treatment Provided  ? Treatment provided Cognitive-Linquistic   ?  ? Cognitive-Linquistic Treatment  ? Treatment focused on Cognition;Patient/family/caregiver education   ? Skilled Treatment Pt continues to implement SLP recommendations and structured plan for month of April. SLP provided education and instruction of memory strategies (repetition, associations) to optimize functional recall and attention strategies (checking off lists, avoiding multitasking while reading). SLP also educated and instructed energy conservation techniques (ID'ing tasks that cause more fatigue) to  aid daily functioning. Pt verbalized understanding and able to demonstrate comprehension by providing appropriate examples given rare min A.   ?  ? Assessment / Recommendations / Plan  ? Plan Continue with current plan of care   ?  ? Progression Toward Goals  ? Progression toward goals Progressing toward goals   ? ?  ?  ? ?  ? ? ? SLP Education - 08/01/21 1123   ? ? Education Details functional memory/attention/energy conservation strategies   ? Person(s) Educated Patient   ? Methods Explanation;Demonstration;Handout   ? Comprehension Verbalized understanding;Returned demonstration;Need further instruction   ? ?  ?  ? ?  ? ? ? SLP Short Term Goals - 07/28/21 0944   ? ?  ? SLP SHORT TERM GOAL #1  ? Title Pt will utilize external/internal memory compensations to aid daily functioning given occasional min A over 2 sessions   ? Baseline 07-04-21, 07-21-21   ? Status Achieved   ?  ? SLP SHORT TERM GOAL #2  ? Title Pt will maintain sustained and focused attention during 15+ minute conversation with used of targeted strategies given occasional A over 2 sessions   ? Baseline 07-04-21, 07-21-21   ? Status Achieved   ?  ? SLP SHORT TERM GOAL #3  ? Title Pt will demonstrate ability to alternate  attention successfully between conversation/structured tasks given occasional min A over 2 sessions   ? Status Deferred   ?  ? SLP SHORT TERM GOAL #4  ? Title Pt will utilize anomia compensations in 30+ minute conversations given occasional min A over 2 sessions   ? Baseline 07-04-21, 07-21-21   ? Status Achieved   ? ?  ?  ? ?  ? ? ? SLP Long Term Goals - 08/01/21 1022   ? ?  ? SLP LONG TERM GOAL #1  ? Title Pt will utilize external/internal memory compensations to aid daily functioning given rare min A over 2 sessions   ? Baseline 07-28-21, 08-01-21   ? Time 2   ? Period Weeks   ? Status Achieved   ?  ? SLP LONG TERM GOAL #2  ? Title Pt will maintain sustained and focused attention during 30+ minute conversations given rare min A over 2 sessions    ? Baseline 07-28-21, 08-01-21   ? Time 2   ? Period Weeks   ? Status Achieved   ?  ? SLP LONG TERM GOAL #3  ? Title Pt will accurately manage medications with no missed doses reported > 2 weeks given rare min A from family   ? Time 2   ? Period Weeks   ? Status On-going   ?  ? SLP LONG TERM GOAL #4  ? Title Pt will utilize anomia compensations in work presentations/business calls given rare min A over 2 sessions   ? Time 2   ? Period Weeks   ? Status On-going   ?  ? SLP LONG TERM GOAL #5  ? Title Pt will report improved cognitive linguistic functioning via PROM by 5 points at last ST session   ? Baseline Cognitive Function: 70   ? Time 2   ? Period Weeks   ? Status On-going   ? ?  ?  ? ?  ? ? ? Plan - 08/01/21 1021   ? ? Clinical Impression Statement Improvements in moderate cognitive communication impairments mildly plateued by recent contraction of COVID-19. Conducted ongoing education and training of compensations for attention, memory, and sensory over-stimulation for improved daily functioning and energy conservation. Pt demonstrates improving awareness and increasing independent modifications to accomodate for current cognitive linguistic challenges. Continue skilled ST to return to PLOF, maximize professional success and QOL   ? Speech Therapy Frequency 2x / week   ? Duration 8 weeks   ? Treatment/Interventions Compensatory strategies;Functional tasks;Cueing hierarchy;Environmental controls;Cognitive reorganization;Multimodal communcation approach;Language facilitation;Compensatory techniques;Internal/external aids;SLP instruction and feedback   ? Potential to Achieve Goals Good   ? ?  ?  ? ?  ? ? ?Patient will benefit from skilled therapeutic intervention in order to improve the following deficits and impairments:   ?Cognitive communication deficit ? ? ? ?Problem List ?Patient Active Problem List  ? Diagnosis Date Noted  ? Neck pain 06/09/2021  ? Post concussion syndrome 06/05/2021  ? Syncope 06/05/2021  ?  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 Head trauma 06/05/2021  ? Elevated troponin   ? LBBB (left bundle branch block)   ? Acute left-sided weakness 03/17/2020  ? Occipital headache 06/11/2018  ? left sided temporal headache 06/11/2018  ? Occipital neuralgia 06/11/2018  ? Vitiligo 11/14/2016  ? Hypertension 07/13/2014  ? Migraine 07/13/2014  ? ? ?Marzetta Board, CCC-SLP ?08/01/2021, 11:26 AM ? ?[facility] ?[place], [place], [address] ?Phone: [phone]  Fax:  [phone] ? ? ?Name: Sharon Friedman ?MRN: [id number] ?Date of Birth: [birth year] ? ?

## 2021-08-04 ENCOUNTER — Ambulatory Visit: Payer: Medicaid Other

## 2021-08-04 ENCOUNTER — Ambulatory Visit: Payer: Medicaid Other | Admitting: Physical Therapy

## 2021-08-04 DIAGNOSIS — R42 Dizziness and giddiness: Secondary | ICD-10-CM

## 2021-08-04 DIAGNOSIS — R293 Abnormal posture: Secondary | ICD-10-CM

## 2021-08-04 DIAGNOSIS — M6281 Muscle weakness (generalized): Secondary | ICD-10-CM

## 2021-08-04 DIAGNOSIS — R41841 Cognitive communication deficit: Secondary | ICD-10-CM

## 2021-08-04 DIAGNOSIS — M79602 Pain in left arm: Secondary | ICD-10-CM

## 2021-08-04 NOTE — Therapy (Signed)
?OUTPATIENT PHYSICAL THERAPY TREATMENT NOTE ? ? ?Patient Name: Sharon Friedman ?MRN: [id number] ?DOB:February 27, 1971, 51 y.o., female ?Today's Date: 08/04/2021 ? ?PCP: Melida Quitter, MD ?REFERRING PROVIDER: Anson Fret, MD ? ? PT End of Session - 08/04/21 0936   ? ? Visit Number 8   ? Number of Visits 16   ? Date for PT Re-Evaluation 09/09/21   ? Authorization Type [employer], $70 Copay, VL: 30 (combined) auth reqd for addl visits   ? PT Start Time 0930   ? PT Stop Time 1015   ? PT Time Calculation (min) 45 min   ? Equipment Utilized During Treatment Other (comment)   dry needles  ? Activity Tolerance Patient tolerated treatment well   ? Behavior During Therapy [facility] for tasks assessed/performed   ? ?  ?  ? ?  ? ? ? ?Past Medical History:  ?Diagnosis Date  ? Hypertension   ? Migraines   ? ?Past Surgical History:  ?Procedure Laterality Date  ? APPENDECTOMY    ? BREAST ENHANCEMENT SURGERY    ? DILATION AND CURETTAGE OF UTERUS    ? EYE SURGERY    ? lifted cornea (as child done for astigmatism)  ? ?Patient Active Problem List  ? Diagnosis Date Noted  ? Neck pain 06/09/2021  ? Post concussion syndrome 06/05/2021  ? Syncope 06/05/2021  ? 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 Head trauma 06/05/2021  ? Elevated troponin   ? LBBB (left bundle branch block)   ? Acute left-sided weakness 03/17/2020  ? Occipital headache 06/11/2018  ? left sided temporal headache 06/11/2018  ? Occipital neuralgia 06/11/2018  ? Vitiligo 11/14/2016  ? Hypertension 07/13/2014  ? Migraine 07/13/2014  ? ? ?REFERRING DIAG: S09.90XA (ICD-10-CM) - Traumatic injury of head, initial encounter R53.83 (ICD-10-CM) - Other fatigue S13.4XXA (ICD-10-CM) - Whiplash injury to neck, initial encounter F07.81 (ICD-10-CM) - Post concussion syndrome M54.81 (ICD-10-CM) - Bilateral occipital neuralgia  ? ?THERAPY DIAG:  ?Dizziness and giddiness ? ?Abnormal posture ? ?Muscle weakness (generalized) ? ?Pain in left arm ? ?PERTINENT HISTORY:  ? Hypertension    ?  Migraines    ?  ?PRECAUTIONS: Fall;Other (comment)  ?  ?Precaution Comments ?Fall, Migraines, HTN  ?  ?SUBJECTIVE:  Feeling better today; pain is about a 4/10 today and dizziness is very minimal today. Would still like to dry needling.  Neck is tight but no HA today. ? ?PAIN:  ?Are you having pain? Yes; reports 6/10 intensity in Lt upper trap and levator - lateral side of neck; soreness in neck; numbess and tingling in left fingers - states the tingling gradually becomes pain; mostly constant  ? ?TODAY'S TREATMENT: ? ?08/04/21: ? ? ? 08/04/21 1335  ?Trigger Point Dry Needling  ?Consent Given? Yes  ?Muscles Treated Head and Neck Upper trapezius;Levator scapulae;Temporalis  ?Dry Needling Comments performed in supine; performed to L side only.  After performing levator pt reported significant decrease in tension in neck.  Also reported decreased referred pain behind ear and eye.  Denied dizziness after needling.  ?Upper Trapezius Response Twitch reponse elicited;Palpable increased muscle length  ?Levator Scapulae Response Twitch response elicited;Palpable increased muscle length  ?Temporalis Response Twitch reponse elicited;Palpable increased muscle length  ? ? ? 08/04/21 1337  ?Manual Therapy  ?Manual Therapy Soft tissue mobilization;Myofascial release  ?Manual therapy comments Performed to L side after dry needling; performed in supine  ?Soft tissue mobilization STM to L upper trap, levator and bilat temporalis muscles to promote increased tissue mobility  and decrease pain  ?Myofascial Release Suboccipital release bilaterally with slight manual traction.  Educated pt on use of tennis balls at home for self suboccipital release; will demonstrate at next visit.  ? ? ?08-01-21: ? ?NeuroRe-ed: ? ?Pt performed x1 viewing exercise in seated position approximately 4' away from target - 2 reps horizontal and vertical -  horizontal 22.19 secs 1st rep, 13.5 secs 2nd rep:  vertical 27.6 secs 1st rep and 17.4 secs 2nd rep with c/o  nausea and dizziness ? ?Pt performed standing in corner on floor - performed small trunk rotations with hand as targets 5 reps to each side, small turn; progressed to larger turn with pt touching wall with each hand 3 reps only due to c/o increased dizziness. ? ?Pt performed standing with wide BOS on floor with EC 15 secs with 2 LOB initially, requiring use of wall for recovery; pt performed standing with feet together with EC with UE support on chair prn for assist with balance 15 sec hold ? ?Pt performed standing with feet shoulder width apart on floor - hands clasped together - made 5 circles clockwise following with eyes and head moving for improved VOR; 3 circles counterclockwise  - pt had mild c/o nausea and needed short seated rest period to allow dizziness to subside ? ?Pt performed marching in place EO 5 reps; EC 5 reps with UE support on chair prn with CGA ? ?Pt performed amb. 30' x 2 reps with slow horizontal head turns; slow turn around and stop to minimize dizziness; pt then amb. 30' x 2 reps with focusing on object straight ahead to allow dizziness to subside.   ? ?PATIENT EDUCATION: ?Education details: Educated on purpose and use of trigger point dry needling; discussed possible side effects and ways to mitigate side effects including hydration, stretching, heat/ice.  Reviewed upper trap and levator stretches.   ?Person educated: Patient ?Education method: Explanation, Demonstration, and Handouts;  ?Education comprehension: returned demonstration and verbal cues required ? ? ?HOME EXERCISE PROGRAM: ?[employer] [id number] (issued on 08-01-21); previous HEP [id number] ([employer])  ? ? ?SHORT TERM GOALS: Target date: 08/15/2021 ? ?FGA TBA and LTG to be set as applicable ?Baseline: TBA ?Goal status: INITIAL ? ?2.  Pt will report 25% improvement in L shoulder/arm pain with functional activities ?Baseline: no improvement; significant pain limiting activities ?Goal status: INITIAL ? ?3.  Pt will report </= 3/5 for  all movements on MSQ to indicate improvement in motion sensitivity and improved activity tolerance.  ?Baseline: 2-4 ?Goal status: INITIAL ? ? ?LONG TERM GOALS: Target date: 09/09/2021 ? ?Pt will be independent with final/progressive HEP for cervical/vestibular/balance ?Baseline: HEP established, completing inconsistently ?Goal status: IN PROGRESS ? ?2.  Pt will improve all cervical AROM by 5 deg and report </= 2/10 pain with neck movement ?Baseline:  ?A/PROM AROM (06/10/21) A/PROM (deg) ?07/25/2021  ?Flexion 42 37 (increased mod - severe pain on the L side)   ?Extension 38 38 (denies pain)  ?Right lateral flexion 28 35 (feels like a stretch)   ?Left lateral flexion 32 35 (moderate pain with completion)  ?Right rotation 44 42 (stretching)  ?Left rotation 46 39 (moderate pain with completion)  ? ?Goal status: IN PROGRESS ? ?3.  Pt will report return to cycling/exercise program and report ability to complete >/= 10  minutes with reports of </= 5/10 dizziness ?Baseline: not currently exercising ?Goal status: IN PROGRESS ? ?4.  Pt will report </= 2/5 for all movements on MSQ to indicate improvement in  motion sensitivity and improved activity tolerance.  ?Baseline: 2-4/5 for all components ?Goal status: INITIAL ? ?5.  Pt will improve FGA by 4 points from baseline to demonstrate improved balance and reduced fall risk ?Baseline: TBA ?Goal status: INITIAL ? ? ? ? ? Plan - 07/25/21 1016   ? ? Clinical Impression Statement Treatment session focused on the use of trigger point dry needling and manual therapy to reduce tension, reduce headaches, and improve ROM to allow for ongoing vestibular training.  Pt tolerated well and reported significant decrease in tension and referred pain in neck and L shoulder.    ? Personal Factors and Comorbidities Comorbidity 2   ? Comorbidities Migraines, HTN   ? Examination-Activity Limitations Carry;Lift   ? Examination-Participation Restrictions Occupation;Community Activity;Cleaning   ?  Stability/Clinical Decision Making Stable/Uncomplicated   ? Rehab Potential Good   ? PT Frequency 2x / week   ? PT Duration 6 weeks   ? PT Treatment/Interventions ADLs/Self Care Home Management;Aquatic Therapy;Ca

## 2021-08-04 NOTE — Therapy (Signed)
[facility] ?[place], [place], [address] ?Phone: [phone]   Fax:  [phone] ? ?Speech Language Pathology Treatment ? ?Patient Details  ?Name: Sharon Friedman ?MRN: [id number] ?Date of Birth: July 18, 1970 ?Referring Provider (SLP): Melvenia Beam, MD ? ? ?Encounter Date: 08/04/2021 ? ? End of Session - 08/04/21 1018   ? ? Visit Number 10   ? Number of Visits 17   ? Date for SLP Re-Evaluation 08/12/21   ? Authorization Type [employer], $70 Copay, VL: 30 (combined) auth req'd for addl visits   ? SLP Start Time 1023   arrived late from PT  ? SLP Stop Time  1104   ? SLP Time Calculation (min) 41 min   ? Activity Tolerance Patient tolerated treatment well   ? ?  ?  ? ?  ? ? ?Past Medical History:  ?Diagnosis Date  ? Hypertension   ? Migraines   ? ? ?Past Surgical History:  ?Procedure Laterality Date  ? APPENDECTOMY    ? BREAST ENHANCEMENT SURGERY    ? DILATION AND CURETTAGE OF UTERUS    ? EYE SURGERY    ? lifted cornea (as child done for astigmatism)  ? ? ?There were no vitals filed for this visit. ? ? Subjective Assessment - 08/04/21 1024   ? ? Subjective "I have a sharp pain in my head"   ? Currently in Pain? Yes   ? Pain Score 8    ? Pain Location Head   ? ?  ?  ? ?  ? ? ? ? ? ? ? ? ADULT SLP TREATMENT - 08/04/21 1017   ? ?  ? General Information  ? Behavior/Cognition Alert;Cooperative;Pleasant mood   ?  ? Treatment Provided  ? Treatment provided Cognitive-Linquistic   ?  ? Cognitive-Linquistic Treatment  ? Treatment focused on Cognition;Patient/family/caregiver education   ? Skilled Treatment Pt inquired about duration compensatory techniques may be needed, in which SLP educated and instructed patient to utilize as needed and fade when no longer as necessary. Pt will attempt to recall prior to using external written aid, which will serve as secondary support. SLP recommended use of exernal aids for medication management as pt endorsing  difficulty with recall. Pt continues to identify positive changes (less multitasking, less stress) with use of learned techniques. SLP educated strategy identification and modifcation for current cognitive challenges.   ?  ? Assessment / Recommendations / Plan  ? Plan Continue with current plan of care   ?  ? Progression Toward Goals  ? Progression toward goals Progressing toward goals   ? ?  ?  ? ?  ? ? ? SLP Education - 08/04/21 1259   ? ? Education Details see instructions   ? Person(s) Educated Patient   ? Methods Explanation;Demonstration;Verbal cues;Handout   ? Comprehension Verbalized understanding;Returned demonstration;Need further instruction   ? ?  ?  ? ?  ? ? ? SLP Short Term Goals - 07/28/21 0944   ? ?  ? SLP SHORT TERM GOAL #1  ? Title Pt will utilize external/internal memory compensations to aid daily functioning given occasional min A over 2 sessions   ? Baseline 07-04-21, 07-21-21   ? Status Achieved   ?  ? SLP SHORT TERM GOAL #2  ? Title Pt will maintain sustained and focused attention during 15+ minute conversation with used of targeted strategies given occasional A over 2 sessions   ? Baseline 07-04-21, 07-21-21   ?  Status Achieved   ?  ? SLP SHORT TERM GOAL #3  ? Title Pt will demonstrate ability to alternate attention successfully between conversation/structured tasks given occasional min A over 2 sessions   ? Status Deferred   ?  ? SLP SHORT TERM GOAL #4  ? Title Pt will utilize anomia compensations in 30+ minute conversations given occasional min A over 2 sessions   ? Baseline 07-04-21, 07-21-21   ? Status Achieved   ? ?  ?  ? ?  ? ? ? SLP Long Term Goals - 08/04/21 1018   ? ?  ? SLP LONG TERM GOAL #1  ? Title Pt will utilize external/internal memory compensations to aid daily functioning given rare min A over 2 sessions   ? Baseline 07-28-21, 08-01-21   ? Status Achieved   ?  ? SLP LONG TERM GOAL #2  ? Title Pt will maintain sustained and focused attention during 30+ minute conversations given rare min  A over 2 sessions   ? Baseline 07-28-21, 08-01-21   ? Status Achieved   ?  ? SLP LONG TERM GOAL #3  ? Title Pt will accurately manage medications with no missed doses reported > 2 weeks given rare min A from family   ? Time 2   ? Period Weeks   ? Status On-going   ?  ? SLP LONG TERM GOAL #4  ? Title Pt will utilize anomia compensations in work presentations/business calls given rare min A over 2 sessions   ? Time 2   ? Period Weeks   ? Status On-going   ?  ? SLP LONG TERM GOAL #5  ? Title Pt will report improved cognitive linguistic functioning via PROM by 5 points at last ST session   ? Baseline Cognitive Function: 70   ? Time 2   ? Period Weeks   ? Status On-going   ? ?  ?  ? ?  ? ? ? Plan - 08/04/21 1018   ? ? Clinical Impression Statement Improvements in moderate cognitive communication impairments reported. Conducted ongoing education and training of compensations for attention, memory, and sensory over-stimulation for improved daily functioning and energy conservation. Pt demonstrates improving awareness and increasing independent modifications to accomodate for current cognitive linguistic challenges. Continue skilled ST to return to PLOF, maximize professional success and QOL   ? Speech Therapy Frequency 2x / week   ? Duration 8 weeks   ? Treatment/Interventions Compensatory strategies;Functional tasks;Cueing hierarchy;Environmental controls;Cognitive reorganization;Multimodal communcation approach;Language facilitation;Compensatory techniques;Internal/external aids;SLP instruction and feedback   ? Potential to Achieve Goals Good   ? ?  ?  ? ?  ? ? ?Patient will benefit from skilled therapeutic intervention in order to improve the following deficits and impairments:   ?Cognitive communication deficit ? ? ? ?Problem List ?Patient Active Problem List  ? Diagnosis Date Noted  ? Neck pain 06/09/2021  ? Post concussion syndrome 06/05/2021  ? Syncope 06/05/2021  ? 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 Head  trauma 06/05/2021  ? Elevated troponin   ? LBBB (left bundle branch block)   ? Acute left-sided weakness 03/17/2020  ? Occipital headache 06/11/2018  ? left sided temporal headache 06/11/2018  ? Occipital neuralgia 06/11/2018  ? Vitiligo 11/14/2016  ? Hypertension 07/13/2014  ? Migraine 07/13/2014  ? ? ?Marzetta Board, CCC-SLP ?08/04/2021, 1:01 PM ? ?[facility] ?[place], [place], [address] ?Phone: [phone]   Fax:  [phone] ? ? ?  Name: Sharon Friedman ?MRN: [id number] ?Date of Birth: [birth year] ? ?

## 2021-08-04 NOTE — Patient Instructions (Addendum)
These memory training tools are temporary. For example, writing down information is necessary right now. You can quiz yourself before looking at your written aid. You can try to scale back from your written aids as you feel ready.  ? ?Write down the date and time or check when you took your medication as safeguard to ensure you taking your medication.  ? ?Act immediately to handle your tasks if you are being interrupted. Ask the other people/task to wait until you completed what you need to.  ? ?Focus on your deep breathing when you feel stressed and triggered to help you refocus.  ? ?Look into those apps to work on some cognitive skills  ? ? ? ? ? ? ? ? ? ?

## 2021-08-08 ENCOUNTER — Ambulatory Visit: Payer: Medicaid Other

## 2021-08-11 ENCOUNTER — Ambulatory Visit: Payer: Medicaid Other

## 2021-08-15 ENCOUNTER — Ambulatory Visit: Payer: Medicaid Other | Admitting: Physical Therapy

## 2021-08-15 ENCOUNTER — Ambulatory Visit: Payer: Medicaid Other

## 2021-08-15 DIAGNOSIS — R42 Dizziness and giddiness: Secondary | ICD-10-CM | POA: Diagnosis not present

## 2021-08-15 DIAGNOSIS — R41841 Cognitive communication deficit: Secondary | ICD-10-CM

## 2021-08-15 NOTE — Patient Instructions (Addendum)
Your current job is to focus on your recovery  ? -Set timers to help you set boundaries  ? -Slowly increase your cognitive load (1-5 minutes at max)  ? -You are currently retraining your brain ?  -Reframing in positive way is a great way to motivate yourself  ? ?Next week: practice typing your "brain dump" while typing  ? ?Consider in the next few weeks: re-typing our takeaways in order to summarize what you have learned and what you could teach other people in your same situation  ?

## 2021-08-15 NOTE — Therapy (Signed)
[place] [facility] ?[address] ?[place], [place], [address] ?Phone: [phone]   Fax:  [phone] ? ?Speech Language Pathology Treatment/Recert ? ?Patient Details  ?Name: Sharon Friedman ?MRN: [id number] ?Date of Birth: May 18, 1970 ?Referring Provider (SLP): Anson Fret, MD ? ? ?Encounter Date: 08/15/2021 ? ? End of Session - 08/15/21 0929   ? ? Visit Number 11   ? Number of Visits 24   ? Date for SLP Re-Evaluation 09/30/21   recert 2x/week for 6 weeks  ? Authorization Type [employer], $70 Copay, VL: 30 (combined) auth req'd for addl visits   ? SLP Start Time 1020   ? SLP Stop Time  1102   ? SLP Time Calculation (min) 42 min   ? Activity Tolerance Patient tolerated treatment well   ? ?  ?  ? ?  ? ? ?Past Medical History:  ?Diagnosis Date  ? Hypertension   ? Migraines   ? ? ?Past Surgical History:  ?Procedure Laterality Date  ? APPENDECTOMY    ? BREAST ENHANCEMENT SURGERY    ? DILATION AND CURETTAGE OF UTERUS    ? EYE SURGERY    ? lifted cornea (as child done for astigmatism)  ? ? ?There were no vitals filed for this visit. ? ? Subjective Assessment - 08/15/21 1020   ? ? Subjective "I had a horrible week"   ? Currently in Pain? No/denies   ? ?  ?  ? ?  ? ? ? ? ? ? ? ? ADULT SLP TREATMENT - 08/15/21 0929   ? ?  ? General Information  ? Behavior/Cognition Alert;Cooperative;Pleasant mood   ?  ? Treatment Provided  ? Treatment provided Cognitive-Linquistic   ?  ? Cognitive-Linquistic Treatment  ? Treatment focused on Cognition;Patient/family/caregiver education   ? Skilled Treatment Significant headaches reported over last week resulting in pt inability to attend therapy. Pt ID'd increased research and screen time (30 additional minutes) which could have potentially caused increased headaches. Sleep was benefical to alleviate pain over several days. Pt able to identify cognitive modifications with mod I. SLP educated additional  strategies and techniques to  aid cognitive edurance and fatigue. Handout provided. No missed medication reported since switching medications to PM.   ?  ? Assessment / Recommendations / Plan  ? Plan Continue with current plan of care;Goals updated   ?  ? Progression Toward Goals  ? Progression toward goals Progressing toward goals   ? ?  ?  ? ?  ? ? ? SLP Education - 08/15/21 1240   ? ? Education Details see instructions for education   ? Person(s) Educated Patient   ? Methods Explanation;Demonstration;Verbal cues;Handout   ? Comprehension Verbalized understanding;Returned demonstration;Need further instruction   ? ?  ?  ? ?  ? ? ? SLP Short Term Goals - 07/28/21 0944   ? ?  ? SLP SHORT TERM GOAL #1  ? Title Pt will utilize external/internal memory compensations to aid daily functioning given occasional min A over 2 sessions   ? Baseline 07-04-21, 07-21-21   ? Status Achieved   ?  ? SLP SHORT TERM GOAL #2  ? Title Pt will maintain sustained and focused attention during 15+ minute conversation with used of targeted strategies given occasional A over 2 sessions   ? Baseline 07-04-21, 07-21-21   ? Status Achieved   ?  ? SLP SHORT TERM GOAL #3  ? Title Pt will demonstrate ability to alternate attention successfully  between conversation/structured tasks given occasional min A over 2 sessions   ? Status Deferred   ?  ? SLP SHORT TERM GOAL #4  ? Title Pt will utilize anomia compensations in 30+ minute conversations given occasional min A over 2 sessions   ? Baseline 07-04-21, 07-21-21   ? Status Achieved   ? ?  ?  ? ?  ? ? ? SLP Long Term Goals - 08/15/21 1031   ? ?  ? SLP LONG TERM GOAL #1  ? Title Pt will utilize external/internal memory compensations to aid daily functioning given rare min A over 2 sessions   ? Baseline 07-28-21, 08-01-21   ? Status Achieved   ?  ? SLP LONG TERM GOAL #2  ? Title Pt will maintain sustained and focused attention during 30+ minute conversations given rare min A over 2 sessions   ? Baseline 07-28-21, 08-01-21   ? Status Achieved   ?   ? SLP LONG TERM GOAL #3  ? Title Pt will accurately manage medications with no missed doses reported > 2 weeks given rare min A from family   ? Status Achieved   ?  ? SLP LONG TERM GOAL #4  ? Title Pt will utilize anomia compensations in work presentations/business calls given rare min A over 2 sessions   ? Baseline 08-15-21   ? Time 6   ? Period Weeks   ? Status On-going   ongoing for recert  ?  ? SLP LONG TERM GOAL #5  ? Title Pt will report improved cognitive linguistic functioning via PROM by 5 points at last ST session   ? Baseline Cognitive Function: 70   ? Time 6   ? Period Weeks   ? Status On-going   ongoing for recert  ?  ? Additional Long Term Goals  ? Additional Long Term Goals Yes   ?  ? SLP LONG TERM GOAL #6  ? Title Pt will complete high level cognitive tasks with implementation of learned techniques to manage cognitive endurance given rare min A over 2 sessions   ? Time 6   ? Period Weeks   ? Status New   ? ?  ?  ? ?  ? ? ? Plan - 08/15/21 1242   ? ? Clinical Impression Statement Slow but steady improvements in moderate cognitive communication impairments reported. Conducted ongoing education and training of compensations for attention, memory, and sensory over-stimulation for improved daily functioning and energy conservation. Pt demonstrates improving awareness and increasing independent modifications to accomodate for current cognitive linguistic challenges. Pt would like to extend ST services given significant benefit and potential for increased progress. Recert completed for 2x/week for 6 weeks to account for patient goals. Continue skilled ST to return to PLOF, maximize professional success and QOL   ? Speech Therapy Frequency 2x / week   ? Duration Other (comment)   6 additional weeks  ? Treatment/Interventions Compensatory strategies;Functional tasks;Cueing hierarchy;Environmental controls;Cognitive reorganization;Multimodal communcation approach;Language facilitation;Compensatory  techniques;Internal/external aids;SLP instruction and feedback   ? Potential to Achieve Goals Good   ? ?  ?  ? ?  ? ? ?Patient will benefit from skilled therapeutic intervention in order to improve the following deficits and impairments:   ?Cognitive communication deficit ? ? ? ?Problem List ?Patient Active Problem List  ? Diagnosis Date Noted  ? Neck pain 06/09/2021  ? Post concussion syndrome 06/05/2021  ? Syncope 06/05/2021  ? 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 Head trauma 06/05/2021  ?  Elevated troponin   ? LBBB (left bundle branch block)   ? Acute left-sided weakness 03/17/2020  ? Occipital headache 06/11/2018  ? left sided temporal headache 06/11/2018  ? Occipital neuralgia 06/11/2018  ? Vitiligo 11/14/2016  ? Hypertension 07/13/2014  ? Migraine 07/13/2014  ? ? ?Gracy RacerKatherine I Johnson, CCC-SLP ?08/15/2021, 12:48 PM ? ?[facility] ?[facility] ?[address] ?[place], [place], [address] ?Phone: [phone]   Fax:  [phone] ? ? ?Name: Sharon Friedman ?MRN: [id number] ?Date of Birth: 11/24/1970 ? ?

## 2021-08-15 NOTE — Therapy (Signed)
?OUTPATIENT PHYSICAL THERAPY TREATMENT NOTE ? ? ?Patient Name: Sharon Friedman ?MRN: [id number] ?DOB:August 02, 1970, 51 y.o., female ?Today's Date: 08/15/2021 ? ?PCP: Michael Boston, MD ?REFERRING PROVIDER: Michael Boston, MD ? ? PT End of Session - 08/15/21 2040   ? ? Visit Number 9   ? Number of Visits 16   ? Date for PT Re-Evaluation 09/09/21   ? Authorization Type [employer], $70 Copay, VL: 30 (combined) auth reqd for addl visits   ? PT Start Time [phone]   ? PT Stop Time 1015   ? PT Time Calculation (min) 42 min   ? Activity Tolerance Patient tolerated treatment well   ? Behavior During Therapy [facility] for tasks assessed/performed   ? ?  ?  ? ?  ? ? ? ? ?Past Medical History:  ?Diagnosis Date  ? Hypertension   ? Migraines   ? ?Past Surgical History:  ?Procedure Laterality Date  ? APPENDECTOMY    ? BREAST ENHANCEMENT SURGERY    ? DILATION AND CURETTAGE OF UTERUS    ? EYE SURGERY    ? lifted cornea (as child done for astigmatism)  ? ?Patient Active Problem List  ? Diagnosis Date Noted  ? Neck pain 06/09/2021  ? Post concussion syndrome 06/05/2021  ? Syncope 06/05/2021  ? 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 Head trauma 06/05/2021  ? Elevated troponin   ? LBBB (left bundle branch block)   ? Acute left-sided weakness 03/17/2020  ? Occipital headache 06/11/2018  ? left sided temporal headache 06/11/2018  ? Occipital neuralgia 06/11/2018  ? Vitiligo 11/14/2016  ? Hypertension 07/13/2014  ? Migraine 07/13/2014  ? ? ?REFERRING DIAG: S09.90XA (ICD-10-CM) - Traumatic injury of head, initial encounter R53.83 (ICD-10-CM) - Other fatigue S13.4XXA (ICD-10-CM) - Whiplash injury to neck, initial encounter F07.81 (ICD-10-CM) - Post concussion syndrome M54.81 (ICD-10-CM) - Bilateral occipital neuralgia  ? ?THERAPY DIAG:  ?Dizziness and giddiness ? ?PERTINENT HISTORY:  ? Hypertension    ? Migraines    ?  ?PRECAUTIONS: Fall;Other (comment)  ?  ?Precaution Comments ?Fall, Migraines, HTN  ?  ?SUBJECTIVE:  Pt states the dry needling  helped for about a week; states last week was really rough - had an extremely bad HA - states the only thing that took the pain away was sleeping - slept a lot this weekend/rested - states she is feeling good today ? ?PAIN:  ?Are you having pain? No pain - has the feeling of pressure on left side of her head  ? ?TODAY'S TREATMENT:  08-15-21 ? ? [facility] PT Assessment - 08/15/21 0001   ? ?  ? Functional Gait  Assessment  ? Gait assessed  Yes   ? Gait Level Surface Walks 20 ft, slow speed, abnormal gait pattern, evidence for imbalance or deviates 10-15 in outside of the 12 in walkway width. Requires more than 7 sec to ambulate 20 ft.   10.07, 8.19  ? Change in Gait Speed Able to change speed, demonstrates mild gait deviations, deviates 6-10 in outside of the 12 in walkway width, or no gait deviations, unable to achieve a major change in velocity, or uses a change in velocity, or uses an assistive device.   ? Gait with Horizontal Head Turns Performs head turns smoothly with slight change in gait velocity (eg, minor disruption to smooth gait path), deviates 6-10 in outside 12 in walkway width, or uses an assistive device.   dizziness rating 2-3/10  ? Gait with Vertical Head Turns Performs task  with moderate change in gait velocity, slows down, deviates 10-15 in outside 12 in walkway width but recovers, can continue to walk.   dizziness rating 3-4/10  ? Gait and Pivot Turn Pivot turns safely within 3 sec and stops quickly with no loss of balance.   ? Step Over Obstacle Is able to step over one shoe box (4.5 in total height) without changing gait speed. No evidence of imbalance.   ? Gait with Narrow Base of Support Ambulates 7-9 steps.   ? Gait with Eyes Closed Walks 20 ft, slow speed, abnormal gait pattern, evidence for imbalance, deviates 10-15 in outside 12 in walkway width. Requires more than 9 sec to ambulate 20 ft.   ? Ambulating Backwards Walks 20 ft, uses assistive device, slower speed, mild gait deviations, deviates  6-10 in outside 12 in walkway width.   ? Steps Alternating feet, no rail.   ? Total Score 19   ? ?  ?  ? ?  ? ? Vestibular Assessment - 08/15/21 0001   ? ?  ? Positional Sensitivities  ? Sit to Supine No dizziness   ? Supine to Left Side No dizziness   ? Supine to Right Side No dizziness   ? Supine to Sitting Severe dizziness   ? Right Hallpike No dizziness   ? Nose to Right Knee Mild dizziness   ? Right Knee to Sitting Severe dizziness   ? Nose to Left Knee Mild dizziness   ? Left Knee to Sitting Severe dizziness   ? Head Turning x 5 Moderate dizziness   ? Head Nodding x 5 Moderate dizziness   dizziness increased significantly on 3rd rep  ? Pivot Right in Standing Mild dizziness   ? Pivot Left in Standing Mild dizziness   ? Rolling Right No dizziness   ? Rolling Left No dizziness   ? ?  ?  ? ?  ?   ? ? ?PATIENT EDUCATION: ?Education details:  Discussed STG's and progress ?Person educated: Patient ?Education method: Explanation, Demonstration, and Handouts;  ?Education comprehension: returned demonstration and verbal cues required ? ? ?HOME EXERCISE PROGRAM: ?Medbridge VARADJDX (issued on 08-01-21); previous HEP [id number] ([place])  ? ? ?SHORT TERM GOALS: Target date: 08/15/2021 ? ?FGA TBA and LTG to be set as applicable ?Baseline: FGA 19/30 on 08-15-21 ?Goal status: Achieved 08-15-21 ? ?2.  Pt will report 25% improvement in L shoulder/arm pain with functional activities ?Baseline: inconsistently met - still has bad headaches intermittently ?Goal status: Partially met- 08-15-21 ? ?3.  Pt will report </= 3/5 for all movements on MSQ to indicate improvement in motion sensitivity and improved activity tolerance.  ?Baseline: 2-4 ?Goal status: Partially met - remains 2-4 - see MSQ above for details ? ? ?LONG TERM GOALS: Target date: 09/09/2021 ? ?Pt will be independent with final/progressive HEP for cervical/vestibular/balance ?Baseline: HEP established, completing inconsistently ?Goal status: IN PROGRESS ? ?2.  Pt will  improve all cervical AROM by 5 deg and report </= 2/10 pain with neck movement ?Baseline:  ?A/PROM AROM (06/10/21) A/PROM (deg) ?07/25/2021  ?Flexion 42 37 (increased mod - severe pain on the L side)   ?Extension 38 38 (denies pain)  ?Right lateral flexion 28 35 (feels like a stretch)   ?Left lateral flexion 32 35 (moderate pain with completion)  ?Right rotation 44 42 (stretching)  ?Left rotation 46 39 (moderate pain with completion)  ? ?Goal status: IN PROGRESS ? ?3.  Pt will report return to cycling/exercise program and report ability  to complete >/= 10  minutes with reports of </= 5/10 dizziness ?Baseline: not currently exercising ?Goal status: IN PROGRESS ? ?4.  Pt will report </= 2/5 for all movements on MSQ to indicate improvement in motion sensitivity and improved activity tolerance.  ?Baseline: 2-4/5 for all components ?Goal status: INITIAL ? ?5.  Pt will improve FGA by 4 points from baseline to demonstrate improved balance and reduced fall risk ?Baseline: 19/30 on 08-15-21 ?Goal status: INITIAL ? ? ? ? ? Plan - 07/25/21 1016   ? ? Clinical Impression Statement Pt has met STG #1 and partially met STG's #2 & 3:  FGA score 19/30 with pt having unsteadiness with amb. With EC, decreased gait speed and difficulty amb. With head turns with vertical > horizontal.  Pt able to perform 5 reps horizontal head turns in seated position and 5 reps vertical, compared to only 3 reps performed at initial eval due to provocation of dizziness & nausea. Cont with POC.   ? Personal Factors and Comorbidities Comorbidity 2   ? Comorbidities Migraines, HTN   ? Examination-Activity Limitations Carry;Lift   ? Examination-Participation Restrictions Occupation;Community Activity;Cleaning   ? Stability/Clinical Decision Making Stable/Uncomplicated   ? Rehab Potential Good   ? PT Frequency 2x / week   ? PT Duration 6 weeks   ? PT Treatment/Interventions ADLs/Self Care Home Management;Aquatic Therapy;Canalith Repostioning;Moist  Heat;Cryotherapy;Therapeutic activities;Functional mobility training;Electrical Stimulation;Iontophoresis 3m/ml Dexamethasone;Stair training;Gait training;Therapeutic exercise;Balance training;Neuromuscular re-education;

## 2021-08-18 ENCOUNTER — Ambulatory Visit: Payer: Medicaid Other | Admitting: Physical Therapy

## 2021-08-18 DIAGNOSIS — R42 Dizziness and giddiness: Secondary | ICD-10-CM | POA: Diagnosis not present

## 2021-08-19 NOTE — Therapy (Signed)
?OUTPATIENT PHYSICAL THERAPY TREATMENT NOTE ? ? ?Patient Name: Sharon Friedman ?MRN: [id number] ?DOB:July 06, 1970, 51 y.o., female ?Today's Date: 08/19/2021 ? ?PCP: Michael Boston, MD ?REFERRING PROVIDER: Michael Boston, MD ? ? PT End of Session - 08/19/21 0954   ? ? Visit Number 10   ? Number of Visits 16   ? Date for PT Re-Evaluation 09/09/21   ? Authorization Type [employer], $70 Copay, VL: 30 (combined) auth reqd for addl visits   ? PT Start Time 3546   ? PT Stop Time 1015   ? PT Time Calculation (min) 40 min   ? Activity Tolerance Patient tolerated treatment well   ? Behavior During Therapy [facility] for tasks assessed/performed   ? ?  ?  ? ?  ? ? ? ?Past Medical History:  ?Diagnosis Date  ? Hypertension   ? Migraines   ? ?Past Surgical History:  ?Procedure Laterality Date  ? APPENDECTOMY    ? BREAST ENHANCEMENT SURGERY    ? DILATION AND CURETTAGE OF UTERUS    ? EYE SURGERY    ? lifted cornea (as child done for astigmatism)  ? ?Patient Active Problem List  ? Diagnosis Date Noted  ? Neck pain 06/09/2021  ? Post concussion syndrome 06/05/2021  ? Syncope 06/05/2021  ? 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 Head trauma 06/05/2021  ? Elevated troponin   ? LBBB (left bundle branch block)   ? Acute left-sided weakness 03/17/2020  ? Occipital headache 06/11/2018  ? left sided temporal headache 06/11/2018  ? Occipital neuralgia 06/11/2018  ? Vitiligo 11/14/2016  ? Hypertension 07/13/2014  ? Migraine 07/13/2014  ? ? ?REFERRING DIAG: S09.90XA (ICD-10-CM) - Traumatic injury of head, initial encounter R53.83 (ICD-10-CM) - Other fatigue S13.4XXA (ICD-10-CM) - Whiplash injury to neck, initial encounter F07.81 (ICD-10-CM) - Post concussion syndrome M54.81 (ICD-10-CM) - Bilateral occipital neuralgia  ? ?THERAPY DIAG:  ?Dizziness and giddiness ? ?PERTINENT HISTORY:  ? Hypertension    ? Migraines    ?  ?PRECAUTIONS: Fall;Other (comment)  ?  ?Precaution Comments ?Fall, Migraines, HTN  ?  ?SUBJECTIVE:  Pt reports she is feeling  much better today than she was on Monday; states she has slept a lot this week - has not had headaches like she had last week ? ?PAIN:  ?Are you having pain? No  ? ?TODAY'S TREATMENT: ? ?08-18-21: ? ?NeuroRe-ed: ? ?Pt performed x1 viewing exercise in standing position - approx. 30 secs horizontal - performed 1 rep:  then performed vertical x1 viewing in standing 30 secs 1st trial standing on floor:   then added Airex - pt performed x1 viewing horizontal approx. 17 secs 1st rep, 23 secs 2nd rep;  vertical with standing on Airex 18 secs, 22 secs 2nd rep ? ?Pt performed gaze stabilization exercises - gently bouncing on green physioball - horizontal head turns 5 reps EO and then vertical 5 reps EO;  EC horizontal 5 reps, vertical 5 reps EC ? ? ?Pt performed amb. 35' in clinic slowly moving target side to side with focusing in moving target - 2 reps completed with mild c/o nausea with this activity ? ?Pt performed marching on incline - target chosen as object outside window; EO 5 reps;  EC 5 reps; then added marching with horizontal head turns 5 reps, vertical 5 reps with marching with EO; marching with EC but no head turns added due to c/o dizziness/mild nausea ? ? ?   ? ?PATIENT EDUCATION: ?Education details:  Medbridge VARADJDX;  pt instructed to continue x1 viewing exercise in standing on FLOOR & try to increase duration to 60 secs (instructed not to perform on compliant surface at home yet);  also instructed pt to add head turns with EO and EC while gently bouncing on physioball  ?Person educated: Patient ?Education method: Teacher, adult education and demonstration ?Education comprehension: returned demonstration and verbal cues required ? ? ?HOME EXERCISE PROGRAM: ?Medbridge VARADJDX (issued on 08-01-21); previous HEP [id number] ([place])  ? ? ?SHORT TERM GOALS: Target date: 08/15/2021 ? ?FGA TBA and LTG to be set as applicable ?Baseline: TBA ?Goal status: Achieved - 08-15-21 ? ?2.  Pt will report 25% improvement in L  shoulder/arm pain with functional activities ?Baseline: no improvement; significant pain limiting activities ?Goal status: Partially met - inconsistent - 08-15-21 ? ?3.  Pt will report </= 3/5 for all movements on MSQ to indicate improvement in motion sensitivity and improved activity tolerance.  ?Baseline: 2-4 ?Goal status: Partially met as not met consistently for all movements - 08-15-21 ? ? ?LONG TERM GOALS: Target date: 09/09/2021 ? ?Pt will be independent with final/progressive HEP for cervical/vestibular/balance ?Baseline: HEP established, completing inconsistently ?Goal status: IN PROGRESS ? ?2.  Pt will improve all cervical AROM by 5 deg and report </= 2/10 pain with neck movement ?Baseline:  ?A/PROM AROM (06/10/21) A/PROM (deg) ?07/25/2021  ?Flexion 42 37 (increased mod - severe pain on the L side)   ?Extension 38 38 (denies pain)  ?Right lateral flexion 28 35 (feels like a stretch)   ?Left lateral flexion 32 35 (moderate pain with completion)  ?Right rotation 44 42 (stretching)  ?Left rotation 46 39 (moderate pain with completion)  ? ?Goal status: IN PROGRESS ? ?3.  Pt will report return to cycling/exercise program and report ability to complete >/= 10  minutes with reports of </= 5/10 dizziness ?Baseline: not currently exercising ?Goal status: IN PROGRESS ? ?4.  Pt will report </= 2/5 for all movements on MSQ to indicate improvement in motion sensitivity and improved activity tolerance.  ?Baseline: 2-4/5 for all components ?Goal status: INITIAL ? ?5.  Pt will improve FGA by 4 points from baseline to demonstrate improved balance and reduced fall risk ?Baseline: TBA ?Goal status: INITIAL ? ? ? ? ? Plan - 07/25/21 1016   ? ? Clinical Impression Statement Pt able to tolerate progression of x1 viewing exercise for approx. 20 secs both horizontal and vertical directions with standing on Airex.  Pt continues to c/o mild nausea with vestibular exercises with EC on compliant or uneven surfaces ([place]).  Pt performed  gentle bouncing on physioball with head turns with EO & EC for first time in today's session.  Cont with POC.  ? Personal Factors and Comorbidities Comorbidity 2   ? Comorbidities Migraines, HTN   ? Examination-Activity Limitations Carry;Lift   ? Examination-Participation Restrictions Occupation;Community Activity;Cleaning   ? Stability/Clinical Decision Making Stable/Uncomplicated   ? Rehab Potential Good   ? PT Frequency 2x / week   ? PT Duration 6 weeks   ? PT Treatment/Interventions ADLs/Self Care Home Management;Aquatic Therapy;Canalith Repostioning;Moist Heat;Cryotherapy;Therapeutic activities;Functional mobility training;Electrical Stimulation;Iontophoresis 76m/ml Dexamethasone;Stair training;Gait training;Therapeutic exercise;Balance training;Neuromuscular re-education;Patient/family education;Manual techniques;Taping;Dry needling;Vestibular;Passive range of motion;Joint Manipulations;Spinal Manipulations   ? PT Next Visit Plan Continue balance and vestibular exercises - check how ex. Went at home - bouncing on ball and duration of x1 viewing exercise  ? Consulted and Agree with Plan of Care Patient   ? ?  ?  ? ?  ? ? ?[id number], PJKDT  Vinnie Level, PT  ?08/19/21, 10:18 AM ?[facility] ?[address] ?[place], [place] [address] ?[phone] ?   ?

## 2021-08-22 ENCOUNTER — Encounter: Payer: Self-pay | Admitting: Physical Therapy

## 2021-08-22 ENCOUNTER — Ambulatory Visit: Payer: Medicaid Other | Admitting: Physical Therapy

## 2021-08-22 ENCOUNTER — Ambulatory Visit: Payer: Medicaid Other | Admitting: Speech Pathology

## 2021-08-22 DIAGNOSIS — R42 Dizziness and giddiness: Secondary | ICD-10-CM

## 2021-08-22 NOTE — Therapy (Signed)
?OUTPATIENT PHYSICAL THERAPY TREATMENT NOTE ? ? ?Patient Name: Sharon Friedman ?MRN: [id number] ?DOB:[birth year], 51 y.o., female ?Today's Date: 08/22/2021 ? ?PCP: Michael Boston, MD ?REFERRING PROVIDER: Sarina Ill, MD ? ? PT End of Session - 08/22/21 1247   ? ? Visit Number 11   ? Number of Visits 16   ? Date for PT Re-Evaluation 09/09/21   ? Authorization Type [employer], $70 Copay, VL: 30 (combined) auth reqd for addl visits   ? PT Start Time 0263   ? PT Stop Time 1016   ? PT Time Calculation (min) 44 min   ? Activity Tolerance Patient tolerated treatment well   ? Behavior During Therapy [facility] for tasks assessed/performed   ? ?  ?  ? ?  ? ? ? ? ?Past Medical History:  ?Diagnosis Date  ? Hypertension   ? Migraines   ? ?Past Surgical History:  ?Procedure Laterality Date  ? APPENDECTOMY    ? BREAST ENHANCEMENT SURGERY    ? DILATION AND CURETTAGE OF UTERUS    ? EYE SURGERY    ? lifted cornea (as child done for astigmatism)  ? ?Patient Active Problem List  ? Diagnosis Date Noted  ? Neck pain 06/09/2021  ? Post concussion syndrome 06/05/2021  ? Syncope 06/05/2021  ? 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 Head trauma 06/05/2021  ? Elevated troponin   ? LBBB (left bundle branch block)   ? Acute left-sided weakness 03/17/2020  ? Occipital headache 06/11/2018  ? left sided temporal headache 06/11/2018  ? Occipital neuralgia 06/11/2018  ? Vitiligo 11/14/2016  ? Hypertension 07/13/2014  ? Migraine 07/13/2014  ? ? ?REFERRING DIAG: S09.90XA (ICD-10-CM) - Traumatic injury of head, initial encounter R53.83 (ICD-10-CM) - Other fatigue S13.4XXA (ICD-10-CM) - Whiplash injury to neck, initial encounter F07.81 (ICD-10-CM) - Post concussion syndrome M54.81 (ICD-10-CM) - Bilateral occipital neuralgia  ? ?THERAPY DIAG:  ?Dizziness and giddiness ? ?PERTINENT HISTORY:  ? Hypertension    ? Migraines    ?  ?PRECAUTIONS: Fall;Other (comment)  ?  ?Precaution Comments ?Fall, Migraines, HTN  ?  ?SUBJECTIVE:  Pt reports she is feeling  good today - does not have a headache but continues to sleep a lot which she thinks is helping to reduce her headaches.  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 ?PAIN:  ?Are you having pain? No  ? ?TODAY'S TREATMENT: ? ?4-24- 23: ? ?NeuroRe-ed: ? ?Standing Balance: ?Surface: Airex ?Position: Feet Hip Width Apart ?Completed with: Eyes Open and Eyes Closed; Head Turns x 5 Reps and Head Nods x 5 Reps ? ?Marching on Airex inside // bars with EO 5 reps;  EC 5 reps; EO with horizontal head turns with intermittent UE support 5 reps, then with vertical head turns 5 reps EO ? ?Pt performed rockerboard exercise - 10 reps EO without UE support;  10 reps EC without UE support; head turns horizontal 5 reps holding board steady EO and then vertical EO 5 reps ? ?Standing on Bosu - Squats 3 reps EO; 5 reps EC; holding steady on Bosu 10 secs; SLS activity - each foot in middle of Bosu (compliant surface) - moving each leg forward/back 5 reps ? ?Amb. 115' around track at increased speed without head turns and then 2nd rep 115' with intermittent head turns horizontal ? ?Gaze Stabilization:  Pt performed amb. 35' in clinic slowly moving target side to side with focusing on moving  target; 35' x 1 rep moving ball clockwise; 35' x 1 rep moving ball counterclockwise; 35' x 1 rep "+" pattern - pt c/o nausea after performing this exercise ? ?Pt performed amb. around track at end of session tossing ball and naming foods "A- Z" with intermittent cues for next letter and for foods starting with that specific letter ? ?  ? ?PATIENT EDUCATION: ?Education details:  Medbridge VARADJDX;  pt instructed to continue x1 viewing exercise in standing on FLOOR & try to increase duration to 60 secs (instructed not to perform on compliant surface at home yet);  also instructed pt to add head turns with EO and EC while gently bouncing on physioball; HEP unchanged on 08-22-21 ?Person  educated: Patient ?Education method: Teacher, adult education and demonstration ?Education comprehension: returned demonstration and verbal cues required ? ? ?HOME EXERCISE PROGRAM: ?Medbridge VARADJDX (issued on 08-01-21); previous HEP [id number] ([place])  ? ? ?SHORT TERM GOALS: Target date: 08/15/2021 ? ?FGA TBA and LTG to be set as applicable ?Baseline: TBA ?Goal status: Achieved - 08-15-21 ? ?2.  Pt will report 25% improvement in L shoulder/arm pain with functional activities ?Baseline: no improvement; significant pain limiting activities ?Goal status: Partially met - inconsistent - 08-15-21 ? ?3.  Pt will report </= 3/5 for all movements on MSQ to indicate improvement in motion sensitivity and improved activity tolerance.  ?Baseline: 2-4 ?Goal status: Partially met as not met consistently for all movements - 08-15-21 ? ? ?LONG TERM GOALS: Target date: 09/09/2021 ? ?Pt will be independent with final/progressive HEP for cervical/vestibular/balance ?Baseline: HEP established, completing inconsistently ?Goal status: IN PROGRESS ? ?2.  Pt will improve all cervical AROM by 5 deg and report </= 2/10 pain with neck movement ?Baseline:  ?A/PROM AROM (06/10/21) A/PROM (deg) ?07/25/2021  ?Flexion 42 37 (increased mod - severe pain on the L side)   ?Extension 38 38 (denies pain)  ?Right lateral flexion 28 35 (feels like a stretch)   ?Left lateral flexion 32 35 (moderate pain with completion)  ?Right rotation 44 42 (stretching)  ?Left rotation 46 39 (moderate pain with completion)  ? ?Goal status: IN PROGRESS ? ?3.  Pt will report return to cycling/exercise program and report ability to complete >/= 10  minutes with reports of </= 5/10 dizziness ?Baseline: not currently exercising ?Goal status: IN PROGRESS ? ?4.  Pt will report </= 2/5 for all movements on MSQ to indicate improvement in motion sensitivity and improved activity tolerance.  ?Baseline: 2-4/5 for all components ?Goal status: INITIAL ? ?5.  Pt will improve FGA by 4 points from  baseline to demonstrate improved balance and reduced fall risk ?Baseline: TBA ?Goal status: INITIAL ? ? ? ? ? Plan - 07/25/21 1016   ? ? Clinical Impression Statement Pt able to perform vestibular exercises with EC on compliant surfaces with head turns with minimal UE support with only mild c/o nausea in today's session.  Pt required few rest breaks.  Pt able to perform amb. At faster speed without head turns with pt focusing straight ahead. Able to name foods while amb. Tossing ball straight up with intermittent cues for subsequent letter and for foods starting with specific letter.  Cont with POC.  ? Personal Factors and Comorbidities Comorbidity 2   ? Comorbidities Migraines, HTN   ? Examination-Activity Limitations Carry;Lift   ? Examination-Participation Restrictions Occupation;Community Activity;Cleaning   ? Stability/Clinical Decision Making Stable/Uncomplicated   ? Rehab Potential Good   ? PT Frequency 2x / week   ? PT  Duration 6 weeks   ? PT Treatment/Interventions ADLs/Self Care Home Management;Aquatic Therapy;Canalith Repostioning;Moist Heat;Cryotherapy;Therapeutic activities;Functional mobility training;Electrical Stimulation;Iontophoresis 32m/ml Dexamethasone;Stair training;Gait training;Therapeutic exercise;Balance training;Neuromuscular re-education;Patient/family education;Manual techniques;Taping;Dry needling;Vestibular;Passive range of motion;Joint Manipulations;Spinal Manipulations   ? PT Next Visit Plan Continue balance and vestibular exercises - add cognitive task with multi-tasking with gait  ? Consulted and Agree with Plan of Care Patient   ? ?  ?  ? ?  ? ? ?[id number], [id number] [id number] PT  ?08/19/21, 10:18 AM ?[facility]?[address] ?[place] [address]?[phone]?   ?

## 2021-08-25 ENCOUNTER — Ambulatory Visit: Payer: Medicaid Other

## 2021-08-25 ENCOUNTER — Ambulatory Visit: Payer: Medicaid Other | Admitting: Physical Therapy

## 2021-08-29 ENCOUNTER — Ambulatory Visit: Payer: Medicaid Other | Admitting: Speech Pathology

## 2021-08-29 ENCOUNTER — Ambulatory Visit: Payer: Medicaid Other | Attending: Neurology

## 2021-08-29 DIAGNOSIS — R41841 Cognitive communication deficit: Secondary | ICD-10-CM | POA: Insufficient documentation

## 2021-08-29 DIAGNOSIS — R42 Dizziness and giddiness: Secondary | ICD-10-CM | POA: Insufficient documentation

## 2021-09-01 ENCOUNTER — Ambulatory Visit: Payer: Medicaid Other | Admitting: Physical Therapy

## 2021-09-01 DIAGNOSIS — R42 Dizziness and giddiness: Secondary | ICD-10-CM

## 2021-09-01 NOTE — Therapy (Addendum)
?OUTPATIENT PHYSICAL THERAPY TREATMENT NOTE - ARRIVED NO CHARGE ? ? ?Patient Name: Sharon Friedman ?MRN: [id number] ?DOB:[birth year], 51 y.o., female ?Today's Date: 09/02/2021 ? ?PCP: Michael Boston, MD ?REFERRING PROVIDER: Sarina Ill, MD ? ? PT End of Session - 09/02/21 0743   ? ? Visit Number 11   ? Number of Visits 16   ? Date for PT Re-Evaluation 09/09/21   ? Authorization Type [employer], $70 Copay, VL: 30 (combined) auth reqd for addl visits; pt reports on 09-01-21 that she now has Healthy [facility] as of 08-29-21   ? PT Start Time 9562   ? PT Stop Time 1005   ? PT Time Calculation (min) 33 min   ? Activity Tolerance Patient tolerated treatment well   ? Behavior During Therapy [facility] for tasks assessed/performed   ? ?  ?  ? ?  ? ? ? ? ? ?Past Medical History:  ?Diagnosis Date  ? Hypertension   ? Migraines   ? ?Past Surgical History:  ?Procedure Laterality Date  ? APPENDECTOMY    ? BREAST ENHANCEMENT SURGERY    ? DILATION AND CURETTAGE OF UTERUS    ? EYE SURGERY    ? lifted cornea (as child done for astigmatism)  ? ?Patient Active Problem List  ? Diagnosis Date Noted  ? Neck pain 06/09/2021  ? Post concussion syndrome 06/05/2021  ? Syncope 06/05/2021  ? 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 Head trauma 06/05/2021  ? Elevated troponin   ? LBBB (left bundle branch block)   ? Acute left-sided weakness 03/17/2020  ? Occipital headache 06/11/2018  ? left sided temporal headache 06/11/2018  ? Occipital neuralgia 06/11/2018  ? Vitiligo 11/14/2016  ? Hypertension 07/13/2014  ? Migraine 07/13/2014  ? ? ?REFERRING DIAG: S09.90XA (ICD-10-CM) - Traumatic injury of head, initial encounter R53.83 (ICD-10-CM) - Other fatigue S13.4XXA (ICD-10-CM) - Whiplash injury to neck, initial encounter F07.81 (ICD-10-CM) - Post concussion syndrome M54.81 (ICD-10-CM) - Bilateral occipital neuralgia  ? ?THERAPY DIAG:  ?Dizziness and giddiness - Plan: PT plan of care cert/re-cert ? ?PERTINENT HISTORY:  ? Hypertension    ? Migraines     ?  ?PRECAUTIONS: Fall;Other (comment)  ?  ?Precaution Comments ?Fall, Migraines, HTN  ?  ?SUBJECTIVE:  Pt reports she is feeling terrible today - states headache started last Thursday, the 27th; states it comes in waves - has been really bad in the mornings (around 6 am); Left eye is slightly less open than Rt eye; reports she is having nausea at this time due to the severity of the headache ? ?No treatment on 09-01-21 due to pt having severe headache and c/o nausea; pt states she did not want to cancel due to having cancelled on Monday this week due to not feeling well; pt states "I wanted to come in and let you know what is going on";  pt also states that her insurance has now changed - has [facility] as of 08-29-21, but forgot to bring her card (pt was instructed to bring to next session)  ? ?PAIN:  ?Are you having pain? Yes ?Location- left side of head; radiates from Lt temporal region to deep inside left ear, then radiates down into shoulder, arm and hand/fingers ?Intensity; 7/10 ?Constant ?States left side of face is numb/tingling - feels that it is swollen but doesn't appear to be ?Aggravating factors; unknown ?Alleviating factors: Excedrin - only takes 1x/day and only if HA is severe ? ? ?PATIENT EDUCATION: ?Education details:  Medbridge VARADJDX;  pt instructed to continue x1 viewing exercise in standing on FLOOR & try to increase duration to 60 secs (instructed not to perform on compliant surface at home yet);  also instructed pt to add head turns with EO and EC while gently bouncing on physioball; HEP unchanged on 08-22-21 ?Person educated: Patient ?Education method: Teacher, adult education and demonstration ?Education comprehension: returned demonstration and verbal cues required ? ? ?HOME EXERCISE PROGRAM: ?Medbridge VARADJDX (issued on 08-01-21); previous HEP [id number] ([place])  ? ? ?SHORT TERM GOALS: Target date: 10/07/21 (pushed out due to start date of 09-22-21 due to schedule availability) ? ? Pt will  improve FGA to >/= 24/30 for improved functional mobility. ? Baseline:  19/30 ? Goal Status:  Updated STG ? ?2.   Pt will report 25% improvement in L shoulder/arm pain with functional activities ?Baseline: no improvement; significant pain limiting activities ?Goal status: Partially met - inconsistent - 08-15-21 (Goal ongoing) ? ?3.  Pt will report </= 3/5 for all movements on MSQ to indicate improvement in motion sensitivity and improved activity tolerance.  ?Baseline: 2-4 ?Goal status: Partially met as not met consistently for all movements - 08-15-21;  (Goal ongoing) ? ? ?LONG TERM GOALS: Target date: 09/09/2021 ? ?Pt will be independent with final/progressive HEP for cervical/vestibular/balance ?Baseline: HEP established, completing inconsistently ?Goal status: IN PROGRESS - ONGOING ? ?2.  Pt will improve all cervical AROM by 5 deg and report </= 2/10 pain with neck movement ?Baseline:  ?A/PROM AROM (06/10/21) A/PROM (deg) ?07/25/2021  ?Flexion 42 37 (increased mod - severe pain on the L side)   ?Extension 38 38 (denies pain)  ?Right lateral flexion 28 35 (feels like a stretch)   ?Left lateral flexion 32 35 (moderate pain with completion)  ?Right rotation 44 42 (stretching)  ?Left rotation 46 39 (moderate pain with completion)  ? ?Goal status: IN PROGRESS ? ?3.  Pt will report return to cycling/exercise program and report ability to complete >/= 10  minutes with reports of </= 5/10 dizziness ?Baseline: not currently exercising due to intermittent headaches ?Goal status: INITIAL ? ?4.  Pt will report </= 2/5 for all movements on MSQ to indicate improvement in motion sensitivity and improved activity tolerance.  ?Baseline: 2-4/5 for all components ?Goal status: INITIAL ? ?5.  Pt will improve FGA by 4 points from baseline to demonstrate improved balance and reduced fall risk ?Baseline: 19/30  ?Goal status: INITIAL ? ? ? ? ? Plan -  ? ? Clinical Impression Statement Pt able to perform vestibular exercises with EC on  compliant surfaces with head turns with minimal UE support with only mild c/o nausea in today's session.  Pt required few rest breaks.  Pt able to perform amb. At faster speed without head turns with pt focusing straight ahead. Able to name foods while amb. Tossing ball straight up with intermittent cues for subsequent letter and for foods starting with specific letter.  Cont with POC.  ? Personal Factors and Comorbidities Comorbidity 2   ? Comorbidities Migraines, HTN   ? Examination-Activity Limitations Carry;Lift   ? Examination-Participation Restrictions Occupation;Community Activity;Cleaning   ? Stability/Clinical Decision Making Stable/Uncomplicated   ? Rehab Potential Good   ? PT Frequency 1x / week   ? PT Duration 6 weeks   ? PT Treatment/Interventions ADLs/Self Care Home Management;Aquatic Therapy;Canalith Repostioning;Moist Heat;Cryotherapy;Therapeutic activities;Functional mobility training;Electrical Stimulation;Iontophoresis 47m/ml Dexamethasone;Stair training;Gait training;Therapeutic exercise;Balance training;Neuromuscular re-education;Patient/family education;Manual techniques;Taping;Dry needling;Vestibular;Passive range of motion;Joint Manipulations;Spinal Manipulations   ? PT Next Visit Plan Continue  balance and vestibular exercises - add cognitive task with multi-tasking with gait  ? Consulted and Agree with Plan of Care Patient   ? ?  ?  ? ?  ? ? ?RPRXYV, OPFYT [id number], PT  ?09-01-21, 10:18 AM ?[facility] ?[address] ?[place], [place] [address] ?[phone] ?   ? ?Managed medicaid CPT codes: [phone]- Therapeutic Exercise, [phone]- Neuro Re-education, [phone] - Gait Training, [phone] - Manual Therapy, [id number] - Therapeutic Activities, and [address]35 - Self Care  ?

## 2021-09-02 ENCOUNTER — Ambulatory Visit: Payer: Medicaid Other

## 2021-09-05 ENCOUNTER — Encounter: Payer: 59 | Admitting: Physical Therapy

## 2021-09-22 ENCOUNTER — Ambulatory Visit: Payer: Medicaid Other | Admitting: Physical Therapy

## 2021-09-22 ENCOUNTER — Ambulatory Visit: Payer: Medicaid Other

## 2021-09-22 DIAGNOSIS — R42 Dizziness and giddiness: Secondary | ICD-10-CM | POA: Diagnosis present

## 2021-09-22 DIAGNOSIS — R41841 Cognitive communication deficit: Secondary | ICD-10-CM | POA: Diagnosis present

## 2021-09-22 NOTE — Therapy (Signed)
[facility] [address] [place], [place], [address] Phone: [phone]   Fax:  [phone]  Speech Language Pathology Treatment/Recert  Patient Details  Name: Sharon Friedman MRN: [id number] Date of Birth: 18-Dec-1970 Referring Provider (SLP): Anson Fret, MD   Encounter Date: 09/22/2021   End of Session - 09/22/21 1033     Visit Number 12    Number of Visits 24    Date for SLP Re-Evaluation 11/18/21   recert for 1x/week for 8 weeks   Authorization Type Pt reported change to [employer] - 27 visits max combined    SLP Start Time 1018    SLP Stop Time  1100    SLP Time Calculation (min) 42 min    Activity Tolerance Patient tolerated treatment well             Past Medical History:  Diagnosis Date   Hypertension    Migraines     Past Surgical History:  Procedure Laterality Date   APPENDECTOMY     BREAST ENHANCEMENT SURGERY     DILATION AND CURETTAGE OF UTERUS     EYE SURGERY     lifted cornea (as child done for astigmatism)    There were no vitals filed for this visit.   Subjective Assessment - 09/22/21 1021     Subjective "I missed a couple weeks because of headaches"    Currently in Pain? Yes    Pain Score 2     Pain Location Head    Pain Orientation Left    Pain Descriptors / Indicators Constant;Pressure                   ADULT SLP TREATMENT - 09/22/21 1030       General Information   Behavior/Cognition Alert;Cooperative;Pleasant mood      Treatment Provided   Treatment provided Cognitive-Linquistic      Cognitive-Linquistic Treatment   Treatment focused on Cognition;Patient/family/caregiver education    Skilled Treatment Recalled and explained rationale for missed visits due to intense weeklong headaches. SLP recommended pt seek medical advice on headache management and feelings of depression as these may be limiting factors impacting progress for cognitive  functioning. Due to delay in intervention, SLP requested patient identify new personal goals with pt indicating difficulty with slow processing and reduced short term recall impacting pt ability to code switch (bilingual), recall parts of conversation, and problem solving efficiently. Will plan to repeat CLQT next session and address pt personal goals as pt has not yet returned to baseline.      Assessment / Recommendations / Plan   Plan Continue with current plan of care;Goals updated      Progression Toward Goals   Progression toward goals Progressing toward goals                SLP Short Term Goals - 07/28/21 0944       SLP SHORT TERM GOAL #1   Title Pt will utilize external/internal memory compensations to aid daily functioning given occasional min A over 2 sessions    Baseline 07-04-21, 07-21-21    Status Achieved      SLP SHORT TERM GOAL #2   Title Pt will maintain sustained and focused attention during 15+ minute conversation with used of targeted strategies given occasional A over 2 sessions    Baseline 07-04-21, 07-21-21    Status Achieved      SLP SHORT TERM GOAL #3   Title Pt  will demonstrate ability to alternate attention successfully between conversation/structured tasks given occasional min A over 2 sessions    Status Deferred      SLP SHORT TERM GOAL #4   Title Pt will utilize anomia compensations in 30+ minute conversations given occasional min A over 2 sessions    Baseline 07-04-21, 07-21-21    Status Achieved              SLP Long Term Goals - 09/22/21 1250       SLP LONG TERM GOAL #1   Title Pt will utilize external/internal memory compensations to aid daily functioning given rare min A over 2 sessions    Baseline 07-28-21, 08-01-21    Status Achieved      SLP LONG TERM GOAL #2   Title Pt will maintain sustained and focused attention during 30+ minute conversations given rare min A over 2 sessions    Baseline 07-28-21, 08-01-21    Status Achieved      SLP  LONG TERM GOAL #3   Title Pt will accurately manage medications with no missed doses reported > 2 weeks given rare min A from family    Status Achieved      SLP LONG TERM GOAL #4   Title Pt will utilize anomia compensations in work presentations/business calls given rare min A over 2 sessions    Baseline 08-15-21, 09-22-21    Status Achieved      SLP LONG TERM GOAL #5   Title Pt will report improved cognitive linguistic functioning via PROM by 5 points at last ST session    Baseline Cognitive Function: 70    Time 8    Period Weeks    Status On-going   ongoing for recert     Additional Long Term Goals   Additional Long Term Goals Yes      SLP LONG TERM GOAL #6   Title Pt will complete high level cognitive tasks with implementation of learned techniques to manage cognitive endurance given rare min A over 2 sessions    Baseline reduced cognitive edurance    Time 8    Period Weeks    Status On-going   ongoing for recert     SLP LONG TERM GOAL #7   Title Pt will utilize processing/attention strategies in conversation and bilingual tasks to aid processing speed given rare min A over 2 sessions    Baseline delayed processing speed    Time 8    Period Weeks    Status New      SLP LONG TERM GOAL #8   Title Pt will implement recall strategies to aid immediate and delayed recall of specific information extracted from auditory discourse given rare min A over 2 sessions    Baseline reduced recall of specific information    Time 8    Period Weeks    Status New              Plan - 09/22/21 1257     Clinical Impression Statement Recent fluctuations in health reported, including week long headaches impacting therapy and life participation. SLP recommended pt consult MD to address headaches and depression as these are interfering factors in cognitive performance. Ongoing difficulty with short term recall, attention, and processing reported, which pt is highly motivated to address. Pt  would like to extend ST services given significant benefit and potential for increased progress. Recert completed for 1x/week for 8 weeks to account for patient goals. Continue skilled ST to  return to [facility], maximize professional success and QOL.    Speech Therapy Frequency 1x /week    Duration 8 weeks    Treatment/Interventions Compensatory strategies;Functional tasks;Cueing hierarchy;Environmental controls;Cognitive reorganization;Multimodal communcation approach;Language facilitation;Compensatory techniques;Internal/external aids;SLP instruction and feedback    Potential to Achieve Goals Good    Potential Considerations Pain level    Consulted and Agree with Plan of Care Patient             Patient will benefit from skilled therapeutic intervention in order to improve the following deficits and impairments:   Cognitive communication deficit  Managed medicaid CPT codes: 78242 - Cognitive training (First 15 min) and 97130 - Cognitive training (each additional 15 min)   Problem List Patient Active Problem List   Diagnosis Date Noted   Neck pain 06/09/2021   Post concussion syndrome 06/05/2021   Syncope 06/05/2021   Persistent cognitive impairment 06/05/2021   Head trauma 06/05/2021   Elevated troponin    LBBB (left bundle branch block)    Acute left-sided weakness 03/17/2020   Occipital headache 06/11/2018   left sided temporal headache 06/11/2018   Occipital neuralgia 06/11/2018   Vitiligo 11/14/2016   Hypertension 07/13/2014   Migraine 07/13/2014    Gracy Racer, CCC-SLP 09/22/2021, 1:05 PM  [facility] [address] [place], [place], [address] Phone: [phone]   Fax:  [phone]   Name: Sharon Friedman MRN: [id number] Date of Birth: 15-Jun-1970

## 2021-09-22 NOTE — Therapy (Signed)
OUTPATIENT PHYSICAL THERAPY TREATMENT NOTE   Patient Name: Sharon Friedman MRN: [id number] DOB:1971/04/07, 51 y.o., female Today's Date: 09/23/2021  PCP: Michael Boston, MD REFERRING PROVIDER: Sarina Ill, MD   PT End of Session - 09/23/21 1024     Visit Number 12   visit 1 with Medicaid [employer]   Number of Visits 17    Date for PT Re-Evaluation 11/10/21    Authorization Type [employer], $70 Copay, VL: 30 (combined) auth reqd for addl visits; pt reports on 09-01-21 that she now has Healthy [employer] [facility] [employer] as of 08-29-21    Authorization - Visit Number 1    Authorization - Number of Visits 27    PT Start Time 0850    PT Stop Time 0935    PT Time Calculation (min) 45 min    Activity Tolerance Patient tolerated treatment well    Behavior During Therapy [facility] for tasks assessed/performed                Past Medical History:  Diagnosis Date   Hypertension    Migraines    Past Surgical History:  Procedure Laterality Date   APPENDECTOMY     BREAST ENHANCEMENT SURGERY     DILATION AND CURETTAGE OF UTERUS     EYE SURGERY     lifted cornea (as child done for astigmatism)   Patient Active Problem List   Diagnosis Date Noted   Neck pain 06/09/2021   Post concussion syndrome 06/05/2021   Syncope 06/05/2021   Persistent cognitive impairment 06/05/2021   Head trauma 06/05/2021   Elevated troponin    LBBB (left bundle branch block)    Acute left-sided weakness 03/17/2020   Occipital headache 06/11/2018   left sided temporal headache 06/11/2018   Occipital neuralgia 06/11/2018   Vitiligo 11/14/2016   Hypertension 07/13/2014   Migraine 07/13/2014    REFERRING DIAG: S09.90XA (ICD-10-CM) - Traumatic injury of head, initial encounter R53.83 (ICD-10-CM) - Other fatigue S13.4XXA (ICD-10-CM) - Whiplash injury to neck, initial encounter F07.81 (ICD-10-CM) - Post concussion syndrome M54.81 (ICD-10-CM) - Bilateral occipital neuralgia   THERAPY DIAG:  Dizziness and  giddiness  PERTINENT HISTORY:   Hypertension     Migraines      PRECAUTIONS: Fall;Other (comment)    Precaution Comments Fall, Migraines, HTN    SUBJECTIVE:  Pt reports she is having constant pressure on Lt side of face - had been intermittent in the past but now seems to be more constant - just became aware this week that it is remaining constant and not subsiding; going to contact Dr. Jaynee Eagles for an earlier appt; pt states she has EEG scheduled in the future - was rescheduled due to change in her insurance   PAIN Are you having pain? 2-3/10 intensity - a discomfort, not a true pain  feels like a permanent bruise on left side of face; Alleviating - tries stretching muscles by opening mouth wide and moving lower jaw  TODAY'S TREATMENT: 09-22-21    NeuroRe-ed:  Standing Balance: Surface: Airex Position: Feet Hip Width Apart Completed with: Eyes Open and Eyes Closed; Head Turns x 5 Reps and Head Nods x 5 Reps   Pt performed rockerboard exercise - 10 reps EO without UE support;  10 reps EC without UE support; head turns horizontal 5 reps holding board steady EO and then vertical EO 5 reps  Standing on Bosu - Squats 5 reps EO; 5 reps EC; holding steady on Bosu 10 secs;  weight shifting on  Bosu - anterior/posterior 10 reps and then laterally 10 reps with UE support on //bars  Amb. 40' tossing and catching ball straight up x 2 reps; then amb. Backward 30' x 1 rep tossing/catching ball Amb. 30' forward tossing/catching ball on Rt side/Lt side  Pt stood on Airex - performed visual tracking of ball clockwise 3 reps and then counterclockwise 3 reps only due to c/o dizziness with some mild nausea  Amb.  115' around track with intermittent head turns horizontal with supervision  Pt performed amb. around track at end of session tossing ball and naming animals"A- Z" with intermittent cues for next letter and for animals starting with that specific letter-  pt had some difficulty with this  activity and skipped some letters  - needed cues for correct order  Pt completed [place] - score 72%   PATIENT EDUCATION: Education details:  Medbridge VARADJDX;  pt instructed to continue x1 viewing exercise in standing on FLOOR & try to increase duration to 60 secs (instructed not to perform on compliant surface at home yet);  also instructed pt to add head turns with EO and EC while gently bouncing on physioball; HEP unchanged on 08-22-21 Person educated: Patient Education method: Acupuncturist Education comprehension: returned demonstration and verbal cues required   HOME EXERCISE PROGRAM: Medbridge VARADJDX (issued on 08-01-21); previous HEP [id number] ([place])    SHORT TERM GOALS:  (3 weeks) Target date: 10/07/21 (pushed out due to start date of 09-22-21 due to schedule availability)   FGA TBA and LTG to be set as applicable Baseline: TBA Goal status: Achieved - 08-15-21  2.  Pt will report 25% improvement in L shoulder/arm pain with functional activities Baseline: no improvement; significant pain limiting activities Goal status: Partially met - inconsistent - 08-15-21  3.  Pt will report </= 3/5 for all movements on MSQ to indicate improvement in motion sensitivity and improved activity tolerance.  Baseline: 2-4 Goal status: Partially met as not met consistently for all movements - 08-15-21   LONG TERM GOALS: (6 weeks) Target date: 11/10/2021  Pt will be independent with final/progressive HEP for cervical/vestibular/balance Baseline: HEP established,completing inconsistently Goal status: IN PROGRESS  2.  Pt will improve all cervical AROM by 5 deg and report </= 2/10 pain with neck movement Baseline:  A/PROM AROM (06/10/21) A/PROM (deg) 07/25/2021  Flexion 42 37 (increased mod - severe pain on the L side)   Extension 38 38 (denies pain)  Right lateral flexion 28 35 (feels like a stretch)   Left lateral flexion 32 35 (moderate pain with completion)  Right  rotation 44 42 (stretching)  Left rotation 46 39 (moderate pain with completion)   Goal status: IN PROGRESS  3.  Pt will report return to cycling/exercise program and report ability to complete >/= 10  minutes with reports of </= 5/10 dizziness Baseline: not currently exercising Goal status: IN PROGRESS  4.  Pt will report </= 2/5 for all movements on MSQ to indicate improvement in motion sensitivity and improved activity tolerance.  Baseline: 2-4/5 for all components Goal status: INITIAL  5.  Pt will improve FGA by 4 points from baseline to demonstrate improved balance and reduced fall risk Baseline: score 19/30 on 08-15-21 Goal status: INITIAL    6.  Improve DHI score from 72% to </= 36% to demo improvement in dizziness for improved quality of life. Baseline:  72% on 09-22-21 Goal status:  New goal established on 09-22-21     Plan - 07/25/21 1016  Clinical Impression Statement PT session focused on balance and gait training with increased vestibular input required with activities.  Pt had moderate difficulty amb. Tossing/catching ball while naming animals A-Z; missed some letters with difficulty remembering subsequent letters.  Pt completed [place] and had score 72% - category of severe handicap on this self-perceived disability rating scale. Cont with POC.   Personal Factors and Comorbidities Comorbidity 2    Comorbidities Migraines, HTN    Examination-Activity Limitations Carry;Lift    Examination-Participation Restrictions Occupation;Community Activity;Cleaning    Stability/Clinical Decision Making Stable/Uncomplicated    Rehab Potential Good    PT Frequency 1x / week    PT Duration 6 weeks    PT Treatment/Interventions ADLs/Self Care Home Management;Aquatic Therapy;Canalith Repostioning;Moist Heat;Cryotherapy;Therapeutic activities;Functional mobility training;Electrical Stimulation;Iontophoresis 52m/ml Dexamethasone;Stair training;Gait training;Therapeutic exercise;Balance  training;Neuromuscular re-education;Patient/family education;Manual techniques;Taping;Dry needling;Vestibular;Passive range of motion;Joint Manipulations;Spinal Manipulations    PT Next Visit Plan Check STG's; Continue balance and vestibular exercises - add cognitive task with multi-tasking with gait   Consulted and Agree with Plan of Care Patient             DLajuana RippleLJenness Corner PT  08/19/21, 10:18 AM [facility] [place] [address] [phone]

## 2021-09-22 NOTE — Patient Instructions (Addendum)
Schedule MD appointment to discuss headaches & depression   Continue with translations (try short videos to see what you can translate)   For each day, continue writing a daily summary   Remember, this is a long recovery - take it slowly and work on your goals/skills as you can tolerate. It takes time to build up endurance

## 2021-09-23 ENCOUNTER — Encounter: Payer: Self-pay | Admitting: Physical Therapy

## 2021-09-29 ENCOUNTER — Encounter: Payer: Self-pay | Admitting: Physical Therapy

## 2021-09-29 ENCOUNTER — Ambulatory Visit: Payer: Medicaid Other

## 2021-09-29 ENCOUNTER — Ambulatory Visit: Payer: Medicaid Other | Attending: Neurology | Admitting: Physical Therapy

## 2021-09-29 DIAGNOSIS — R41841 Cognitive communication deficit: Secondary | ICD-10-CM | POA: Diagnosis present

## 2021-09-29 DIAGNOSIS — R42 Dizziness and giddiness: Secondary | ICD-10-CM | POA: Diagnosis present

## 2021-09-29 NOTE — Therapy (Signed)
OUTPATIENT PHYSICAL THERAPY TREATMENT NOTE   Patient Name: Sharon Friedman MRN: [id number] DOB:1970/11/30, 51 y.o., female Today's Date: 09/29/2021  PCP: Michael Boston, MD REFERRING PROVIDER: Sarina Ill, MD   PT End of Session - 09/29/21 1528     Visit Number 13   visit 2 with Medicaid [employer]   Number of Visits 17    Date for PT Re-Evaluation 11/10/21    Authorization Type [employer], $70 Copay, VL: 30 (combined) auth reqd for addl visits; pt reports on 09-01-21 that she now has Healthy [employer] as of 08-29-21    Authorization - Visit Number 2    Authorization - Number of Visits 27    PT Start Time 1148    PT Stop Time 1216    PT Time Calculation (min) 28 min    Activity Tolerance Patient tolerated treatment well    Behavior During Therapy WFL for tasks assessed/performed                 Past Medical History:  Diagnosis Date   Hypertension    Migraines    Past Surgical History:  Procedure Laterality Date   APPENDECTOMY     BREAST ENHANCEMENT SURGERY     DILATION AND CURETTAGE OF UTERUS     EYE SURGERY     lifted cornea (as child done for astigmatism)   Patient Active Problem List   Diagnosis Date Noted   Neck pain 06/09/2021   Post concussion syndrome 06/05/2021   Syncope 06/05/2021   Persistent cognitive impairment 06/05/2021   Head trauma 06/05/2021   Elevated troponin    LBBB (left bundle branch block)    Acute left-sided weakness 03/17/2020   Occipital headache 06/11/2018   left sided temporal headache 06/11/2018   Occipital neuralgia 06/11/2018   Vitiligo 11/14/2016   Hypertension 07/13/2014   Migraine 07/13/2014    REFERRING DIAG: S09.90XA (ICD-10-CM) - Traumatic injury of head, initial encounter R53.83 (ICD-10-CM) - Other fatigue S13.4XXA (ICD-10-CM) - Whiplash injury to neck, initial encounter F07.81 (ICD-10-CM) - Post concussion syndrome M54.81 (ICD-10-CM) - Bilateral occipital neuralgia   THERAPY DIAG:  Dizziness and  giddiness  PERTINENT HISTORY:   Hypertension     Migraines      PRECAUTIONS: Fall;Other (comment)    Precaution Comments Fall, Migraines, HTN    SUBJECTIVE:  Pt reports she is having a bad headache at this time - was late for speech but did not want to cancel PT - wanted to "let you know what is going on" and also "want to know what I should do regarding exercises during the times I am having a bad headache like this"  PAIN Are you having pain? 7/10 intensity - pressure on left side of head  States it started around 1:00 this am and has been continuous since 1:00 this morning Alleviating - sleep helps  TODAY'S TREATMENT: 09-29-21  Self Care:  Discussed recommendation for HEP during the time in which pt is having moderate to severe headache; recommend pt walk (outside if possible) and perform horizontal head turns intermittently to tolerance; informed pt that she should not attempt x 1 viewing exercise or attempt any exercise that would require intense focus and concentration:  pt verbalized understanding  PATIENT EDUCATION: Education details:  Medbridge VARADJDX;  pt instructed to continue x1 viewing exercise in standing on FLOOR & try to increase duration to 60 secs (instructed not to perform on compliant surface at home yet);  also instructed pt to add head  turns with EO and EC while gently bouncing on physioball; HEP unchanged on 08-22-21 Person educated: Patient Education method: Acupuncturist Education comprehension: returned demonstration and verbal cues required   HOME EXERCISE PROGRAM: Medbridge VARADJDX (issued on 08-01-21); previous HEP [id number] ([place])    SHORT TERM GOALS:  (3 weeks) Target date: 10/07/21 (pushed out due to start date of 09-22-21 due to schedule availability)   FGA TBA and LTG to be set as applicable Baseline: TBA Goal status: Achieved - 08-15-21  2.  Pt will report 25% improvement in L shoulder/arm pain with functional  activities Baseline: no improvement; significant pain limiting activities Goal status: Partially met - inconsistent - 08-15-21  3.  Pt will report </= 3/5 for all movements on MSQ to indicate improvement in motion sensitivity and improved activity tolerance.  Baseline: 2-4 Goal status: Partially met as not met consistently for all movements - 08-15-21   LONG TERM GOALS: (6 weeks) Target date: 11/10/2021  Pt will be independent with final/progressive HEP for cervical/vestibular/balance Baseline: HEP established,completing inconsistently Goal status: IN PROGRESS  2.  Pt will improve all cervical AROM by 5 deg and report </= 2/10 pain with neck movement Baseline:  A/PROM AROM (06/10/21) A/PROM (deg) 07/25/2021  Flexion 42 37 (increased mod - severe pain on the L side)   Extension 38 38 (denies pain)  Right lateral flexion 28 35 (feels like a stretch)   Left lateral flexion 32 35 (moderate pain with completion)  Right rotation 44 42 (stretching)  Left rotation 46 39 (moderate pain with completion)   Goal status: IN PROGRESS  3.  Pt will report return to cycling/exercise program and report ability to complete >/= 10  minutes with reports of </= 5/10 dizziness Baseline: not currently exercising Goal status: IN PROGRESS  4.  Pt will report </= 2/5 for all movements on MSQ to indicate improvement in motion sensitivity and improved activity tolerance.  Baseline: 2-4/5 for all components Goal status: INITIAL  5.  Pt will improve FGA by 4 points from baseline to demonstrate improved balance and reduced fall risk Baseline: score 19/30 on 08-15-21 Goal status: INITIAL    6.  Improve DHI score from 72% to </= 36% to demo improvement in dizziness for improved quality of life. Baseline:  72% on 09-22-21 Goal status:  New goal established on 09-22-21     Plan - 07/25/21 1016     Clinical Impression Statement Pt presented to PT with c/o severe HA which she stated started around 1 AM and has  been continuous since that time.  Pt requesting not to do exercise today due to HA, but requests wanting to learn what she should do when she is experiencing moderate to severe HA.  Pt was instructed to hold on x1 viewing exercise or any exercise requiring focus and concentration, but to walk for mobility and vestibular stimulation with head turns intermittently to tolerance.  Pt verbalized understanding.   Cont with POC.   Personal Factors and Comorbidities Comorbidity 2    Comorbidities Migraines, HTN    Examination-Activity Limitations Carry;Lift    Examination-Participation Restrictions Occupation;Community Activity;Cleaning    Stability/Clinical Decision Making Stable/Uncomplicated    Rehab Potential Good    PT Frequency 1x / week    PT Duration 6 weeks    PT Treatment/Interventions ADLs/Self Care Home Management;Aquatic Therapy;Canalith Repostioning;Moist Heat;Cryotherapy;Therapeutic activities;Functional mobility training;Electrical Stimulation;Iontophoresis 17m/ml Dexamethasone;Stair training;Gait training;Therapeutic exercise;Balance training;Neuromuscular re-education;Patient/family education;Manual techniques;Taping;Dry needling;Vestibular;Passive range of motion;Joint Manipulations;Spinal Manipulations    PT Next  Visit Plan Continue balance and vestibular exercises - add cognitive task with multi-tasking with gait   Consulted and Agree with Plan of Care Patient             Lajuana Ripple [place], PT  08/19/21, 10:18 AM [facility] [address], [place], [place] [address] [phone]

## 2021-10-06 ENCOUNTER — Ambulatory Visit: Payer: Medicaid Other

## 2021-10-06 ENCOUNTER — Ambulatory Visit: Payer: Medicaid Other | Admitting: Physical Therapy

## 2021-10-06 DIAGNOSIS — R42 Dizziness and giddiness: Secondary | ICD-10-CM | POA: Diagnosis not present

## 2021-10-06 DIAGNOSIS — R41841 Cognitive communication deficit: Secondary | ICD-10-CM

## 2021-10-06 NOTE — Therapy (Signed)
[facility] [address] [place], [place], [address] Phone: [phone]   Fax:  [phone]  Speech Language Pathology Treatment  Patient Details  Name: Sharon Friedman MRN: [id number] Date of Birth: 11/13/1970 Referring Provider (SLP): Anson FretAhern, Antonia B, MD   Encounter Date: 10/06/2021   End of Session - 10/06/21 0902     Visit Number 13    Number of Visits 24    Date for SLP Re-Evaluation 11/18/21    [facility] - 27 visits max combined    Authorization - Visit Number 2    SLP Start Time [phone]   pt arrived late   SLP Stop Time  0930    SLP Time Calculation (min) 35 min    Activity Tolerance Patient tolerated treatment well             Past Medical History:  Diagnosis Date   Hypertension    Migraines     Past Surgical History:  Procedure Laterality Date   APPENDECTOMY     BREAST ENHANCEMENT SURGERY     DILATION AND CURETTAGE OF UTERUS     EYE SURGERY     lifted cornea (as child done for astigmatism)    There were no vitals filed for this visit.   Subjective Assessment - 10/06/21 0857     Subjective "I'm sorry I missed. I had a headache"    Currently in Pain? No/denies                   ADULT SLP TREATMENT - 10/06/21 0859       General Information   Behavior/Cognition Alert;Cooperative;Pleasant mood      Treatment Provided   Treatment provided Cognitive-Linquistic      Cognitive-Linquistic Treatment   Treatment focused on Cognition;Patient/family/caregiver education    Skilled Treatment Ongoing difficulty with prolonged, intense headaches reported. Upcoming neurology appointment next week. Pt has noted some recent cognitive improvements, including increasing time while translating (10 up to 12 minutes). Re-adminstered CLQT subtests for memory and language to compare to baseline (moderate memory and low WNL language), with same  severities exhibited today. Pt exhibited some delays during confrontation naming (benefited from extra processing time and use of description strategies). Pt recalled 11/18 details from story. Pt recalled 3/6 designs within time frame (additional correct identification x1 outside of 10 seconds). Pt indicated some dizziness while completing design memory subtest, which may have impacted score.      Assessment / Recommendations / Plan   Plan Continue with current plan of care     Progression Toward Goals   Progression toward goals Progressing toward goals              SLP Education - 10/06/21 1038     Education Details building cognitive endurance, impact of headaches of daily functioning    Person(s) Educated Patient    Methods Explanation;Demonstration    Comprehension Verbalized understanding;Returned demonstration;Need further instruction              SLP Short Term Goals - 07/28/21 0944       SLP SHORT TERM GOAL #1   Title Pt will utilize external/internal memory compensations to aid daily functioning given occasional min A over 2 sessions    Baseline 07-04-21, 07-21-21    Status Achieved      SLP SHORT TERM GOAL #2   Title Pt will maintain sustained and focused attention during 15+ minute conversation with  used of targeted strategies given occasional A over 2 sessions    Baseline 07-04-21, 07-21-21    Status Achieved      SLP SHORT TERM GOAL #3   Title Pt will demonstrate ability to alternate attention successfully between conversation/structured tasks given occasional min A over 2 sessions    Status Deferred      SLP SHORT TERM GOAL #4   Title Pt will utilize anomia compensations in 30+ minute conversations given occasional min A over 2 sessions    Baseline 07-04-21, 07-21-21    Status Achieved              SLP Long Term Goals - 10/06/21 1021       SLP LONG TERM GOAL #1   Title Pt will utilize external/internal memory compensations to aid daily functioning given  rare min A over 2 sessions    Baseline 07-28-21, 08-01-21    Status Achieved      SLP LONG TERM GOAL #2   Title Pt will maintain sustained and focused attention during 30+ minute conversations given rare min A over 2 sessions    Baseline 07-28-21, 08-01-21    Status Achieved      SLP LONG TERM GOAL #3   Title Pt will accurately manage medications with no missed doses reported > 2 weeks given rare min A from family    Status Achieved      SLP LONG TERM GOAL #4   Title Pt will utilize anomia compensations in work presentations/business calls given rare min A over 2 sessions    Baseline 08-15-21, 09-22-21    Status Achieved      SLP LONG TERM GOAL #5   Title Pt will report improved cognitive linguistic functioning via PROM by 5 points at last ST session    Baseline Cognitive Function: 70    Time 7    Period Weeks    Status On-going   ongoing for recert     SLP LONG TERM GOAL #6   Title Pt will complete high level cognitive tasks with implementation of learned techniques to manage cognitive endurance given rare min A over 2 sessions    Baseline reduced cognitive edurance    Time 7    Period Weeks    Status On-going   ongoing for recert     SLP LONG TERM GOAL #7   Title Pt will utilize processing/attention strategies in conversation and bilingual tasks to aid processing speed given rare min A over 2 sessions    Baseline delayed processing speed    Time 7    Period Weeks    Status On-going      SLP LONG TERM GOAL #8   Title Pt will implement recall strategies to aid immediate and delayed recall of specific information extracted from auditory discourse given rare min A over 2 sessions    Baseline reduced recall of specific information    Time 7    Period Weeks    Status On-going              Plan - 10/06/21 1039     Clinical Impression Statement Recent fluctuations in health reported, including week long headaches impacting therapy and life participation. SLP recommended pt  consult MD to address headaches and depression as these are interfering factors in cognitive performance. Ongoing difficulty with short term recall, attention, and processing reported, which pt is highly motivated to address. Some improvements in cognitive endurance reported while reading and translating per SLP  recommendations. Continue skilled ST to return to PLOF, maximize professional success and QOL.    Speech Therapy Frequency 1x /week    Duration 8 weeks    Treatment/Interventions Compensatory strategies;Functional tasks;Cueing hierarchy;Environmental controls;Cognitive reorganization;Multimodal communcation approach;Language facilitation;Compensatory techniques;Internal/external aids;SLP instruction and feedback    Potential to Achieve Goals Good    Potential Considerations Pain level    Consulted and Agree with Plan of Care Patient             Patient will benefit from skilled therapeutic intervention in order to improve the following deficits and impairments:   Cognitive communication deficit    Problem List Patient Active Problem List   Diagnosis Date Noted   Neck pain 06/09/2021   Post concussion syndrome 06/05/2021   Syncope 06/05/2021   Persistent cognitive impairment 06/05/2021   Head trauma 06/05/2021   Elevated troponin    LBBB (left bundle branch block)    Acute left-sided weakness 03/17/2020   Occipital headache 06/11/2018   left sided temporal headache 06/11/2018   Occipital neuralgia 06/11/2018   Vitiligo 11/14/2016   Hypertension 07/13/2014   Migraine 07/13/2014    Gracy Racer, CCC-SLP 10/06/2021, 10:41 AM  [facility] [address] [place], [place], [address] Phone: [phone]   Fax:  [phone]   Name: Sharon Friedman MRN: [id number] Date of Birth: 04-19-1971

## 2021-10-07 ENCOUNTER — Encounter: Payer: Self-pay | Admitting: Physical Therapy

## 2021-10-07 NOTE — Therapy (Signed)
OUTPATIENT PHYSICAL THERAPY TREATMENT NOTE   Patient Name: Sharon Friedman MRN: [id number] DOB:Jul 11, 1970, 51 y.o., female Today's Date: 10/07/2021  PCP: Michael Boston, MD REFERRING PROVIDER: Sarina Ill, MD   PT End of Session - 10/07/21 1022     Visit Number 14   visit 3 with Medicaid [employer]   Number of Visits 17    Date for PT Re-Evaluation 11/10/21    Authorization Type [employer], $70 Copay, VL: 30 (combined) auth reqd for addl visits; pt reports on 09-01-21 that she now has Healthy [employer] as of 08-29-21    Authorization - Visit Number 3    Authorization - Number of Visits 27    PT Start Time 0932    PT Stop Time 1015    PT Time Calculation (min) 43 min    Activity Tolerance Patient tolerated treatment well    Behavior During Therapy [facility] for tasks assessed/performed                Past Medical History:  Diagnosis Date   Hypertension    Migraines    Past Surgical History:  Procedure Laterality Date   APPENDECTOMY     BREAST ENHANCEMENT SURGERY     DILATION AND CURETTAGE OF UTERUS     EYE SURGERY     lifted cornea (as child done for astigmatism)   Patient Active Problem List   Diagnosis Date Noted   Neck pain 06/09/2021   Post concussion syndrome 06/05/2021   Syncope 06/05/2021   Persistent cognitive impairment 06/05/2021   Head trauma 06/05/2021   Elevated troponin    LBBB (left bundle branch block)    Acute left-sided weakness 03/17/2020   Occipital headache 06/11/2018   left sided temporal headache 06/11/2018   Occipital neuralgia 06/11/2018   Vitiligo 11/14/2016   Hypertension 07/13/2014   Migraine 07/13/2014    REFERRING DIAG: S09.90XA (ICD-10-CM) - Traumatic injury of head, initial encounter R53.83 (ICD-10-CM) - Other fatigue S13.4XXA (ICD-10-CM) - Whiplash injury to neck, initial encounter F07.81 (ICD-10-CM) - Post concussion syndrome M54.81 (ICD-10-CM) - Bilateral occipital neuralgia   THERAPY DIAG:  Dizziness and  giddiness  PERTINENT HISTORY:   Hypertension     Migraines      PRECAUTIONS: Fall;Other (comment)    Precaution Comments Fall, Migraines, HTN    SUBJECTIVE:  Pt reports she feels much better today - is not having a headache like she was having last week; states stress seems to provoke the headache.  Pt reports she is moving to [place] with her fiancee but wants to remain in therapy at this facility until she has arranged PT and OT services in [place].  Pt states she still has not had EEG done - has appt on Monday with neurologist  PAIN Are you having pain? 2/10 intensity - a discomfort, not a true pain  feels like a permanent bruise on left side of face; Alleviating - tries stretching muscles by opening mouth wide and moving lower jaw  TODAY'S TREATMENT: 10-06-21    NeuroRe-ed:  Standing Balance: Surface: Airex Position: Feet Hip Width Apart Completed with: Eyes Open and Eyes Closed; Head Turns x 5 Reps and Head Nods x 5 Reps  Pt stood on pillows - performed 3 squats with EO: then 5 squats with EC for increased vestibular input with movement  X1 viewing exercise in standing - 30 secs with target on plain background horizontal and vertical; then pt stood on Airex and performed x1 viewing with target on plain  background 30 secs horizontal and vertical; increased dizziness reported vertical than with horizontal head turns  Amb. 40' tossing and catching ball straight up x 2 reps; then amb. Backward 30' x 1 rep tossing/catching ball  Pt performed amb. Forward making various patterns with ball with visual tracking - 30' - circles clockwise and counterclockwise 1 rep each; "S" pattern and then "X" pattern x 1 rep each  Pt stood on Airex - performed visual tracking of ball clockwise 3 reps and then counterclockwise 3 reps only due to c/o dizziness with some mild nausea  Amb.  115' around track with intermittent head turns horizontal with supervision  Pt performed amb. around track at  end of session tossing ball and naming foods "A- Z" with intermittent cues for next letter and for foods starting with that specific letter - needed less cues for correct order with this activity in today's session compared to that in previous session 2 weeks ago     PATIENT EDUCATION: Education details:  Medbridge VARADJDX;  pt instructed to continue x1 viewing exercise in standing on FLOOR & try to increase duration to 60 secs (instructed not to perform on compliant surface at home yet);  also instructed pt to add head turns with EO and EC while gently bouncing on physioball; HEP unchanged on 08-22-21 Person educated: Patient Education method: Acupuncturist Education comprehension: returned demonstration and verbal cues required   HOME EXERCISE PROGRAM: Medbridge VARADJDX (issued on 08-01-21); previous HEP [id number] ([place])    SHORT TERM GOALS:  (3 weeks) Target date: 10/07/21 (pushed out due to start date of 09-22-21 due to schedule availability)   FGA TBA and LTG to be set as applicable Baseline: TBA Goal status: Achieved - 08-15-21  2.  Pt will report 25% improvement in L shoulder/arm pain with functional activities Baseline: no improvement; significant pain limiting activities Goal status: Partially met - inconsistent - 10-06-21  3.  Pt will report </= 3/5 for all movements on MSQ to indicate improvement in motion sensitivity and improved activity tolerance.  Baseline: 2-4 Goal status: Goal met 10-06-21; dizziness varies depending on if pt has a HA; no HA reported in today's session, therefore, less dizziness reported with activity   LONG TERM GOALS: (6 weeks) Target date: 11/10/2021  Pt will be independent with final/progressive HEP for cervical/vestibular/balance Baseline: HEP established,completing inconsistently Goal status: IN PROGRESS  2.  Pt will improve all cervical AROM by 5 deg and report </= 2/10 pain with neck movement Baseline:  A/PROM AROM  (06/10/21) A/PROM (deg) 07/25/2021  Flexion 42 37 (increased mod - severe pain on the L side)   Extension 38 38 (denies pain)  Right lateral flexion 28 35 (feels like a stretch)   Left lateral flexion 32 35 (moderate pain with completion)  Right rotation 44 42 (stretching)  Left rotation 46 39 (moderate pain with completion)   Goal status: IN PROGRESS  3.  Pt will report return to cycling/exercise program and report ability to complete >/= 10  minutes with reports of </= 5/10 dizziness Baseline: not currently exercising Goal status: IN PROGRESS  4.  Pt will report </= 2/5 for all movements on MSQ to indicate improvement in motion sensitivity and improved activity tolerance.  Baseline: 2-4/5 for all components Goal status: INITIAL  5.  Pt will improve FGA by 4 points from baseline to demonstrate improved balance and reduced fall risk Baseline: score 19/30 on 08-15-21 Goal status: INITIAL    6.  Improve DHI score  from 72% to </= 36% to demo improvement in dizziness for improved quality of life. Baseline:  72% on 09-22-21 Goal status:  New goal established on 09-22-21     Plan - 07/25/21 1016     Clinical Impression Statement Pt met STG #3 and STG #2 partially met as she reports LUE pain varies depending on presence of HA.  STG #1 was met on 08-15-21.  PT session focused on balance and gait training with increased vestibular input required with activities.  Pt improved with amb. Tossing ball and naming foods A-Z with consecutive letters with less cues required for correct sequence.  Pt reported no headache at start of session which correlates with activity tolerance;  she is able to tolerate and perform activities and exercises when not experiencing a headache.  Cont with POC.   Personal Factors and Comorbidities Comorbidity 2    Comorbidities Migraines, HTN    Examination-Activity Limitations Carry;Lift    Examination-Participation Restrictions Occupation;Community Activity;Cleaning     Stability/Clinical Decision Making Stable/Uncomplicated    Rehab Potential Good    PT Frequency 1x / week    PT Duration 6 weeks    PT Treatment/Interventions ADLs/Self Care Home Management;Aquatic Therapy;Canalith Repostioning;Moist Heat;Cryotherapy;Therapeutic activities;Functional mobility training;Electrical Stimulation;Iontophoresis 31m/ml Dexamethasone;Stair training;Gait training;Therapeutic exercise;Balance training;Neuromuscular re-education;Patient/family education;Manual techniques;Taping;Dry needling;Vestibular;Passive range of motion;Joint Manipulations;Spinal Manipulations    PT Next Visit Plan Continue balance and vestibular exercises - add cognitive task with multi-tasking with gait   Consulted and Agree with Plan of Care Patient             DLajuana RippleLJenness Corner PT  10/07/21, 10:31 AM [facility], [place] [phone]

## 2021-10-10 ENCOUNTER — Encounter: Payer: Self-pay | Admitting: Neurology

## 2021-10-10 ENCOUNTER — Ambulatory Visit: Payer: 59 | Admitting: Neurology

## 2021-10-10 ENCOUNTER — Telehealth: Payer: Self-pay

## 2021-10-10 NOTE — Telephone Encounter (Signed)
Patient left a voicemail with our office this morning explaining that she will not make her appointment today because she broke her foot last night.  She is currently at urgent care.  She would like to reschedule this appointment.

## 2021-10-17 DIAGNOSIS — Z0271 Encounter for disability determination: Secondary | ICD-10-CM

## 2021-10-20 ENCOUNTER — Ambulatory Visit: Payer: Medicaid Other

## 2021-10-20 ENCOUNTER — Ambulatory Visit: Payer: Medicaid Other | Admitting: Physical Therapy

## 2021-10-27 ENCOUNTER — Ambulatory Visit: Payer: Medicaid Other

## 2021-10-27 DIAGNOSIS — R42 Dizziness and giddiness: Secondary | ICD-10-CM | POA: Diagnosis not present

## 2021-10-27 DIAGNOSIS — R41841 Cognitive communication deficit: Secondary | ICD-10-CM

## 2021-10-27 NOTE — Therapy (Signed)
[facility] [address] [place], [place], [address] Phone: [phone]   Fax:  [phone]  Speech Language Pathology Treatment  Patient Details  Name: Sharon Friedman MRN: [id number] Date of Birth: 05-03-1970 Referring Provider (SLP): Anson Fret, MD   Encounter Date: 10/27/2021   End of Session - 10/27/21 0845     Visit Number 14    Number of Visits 24    Date for SLP Re-Evaluation 11/18/21    Authorization Type Pt reported change to [facility] - 27 visits max combined    Authorization - Visit Number 3    SLP Start Time [phone]   restroom   SLP Stop Time  0930    SLP Time Calculation (min) 40 min    Activity Tolerance Patient tolerated treatment well             Past Medical History:  Diagnosis Date   Hypertension    Migraines     Past Surgical History:  Procedure Laterality Date   APPENDECTOMY     BREAST ENHANCEMENT SURGERY     DILATION AND CURETTAGE OF UTERUS     EYE SURGERY     lifted cornea (as child done for astigmatism)    There were no vitals filed for this visit.   Subjective Assessment - 10/27/21 0846     Subjective "I couldn't come because we moved"    Currently in Pain? Yes    Pain Score 2     Pain Location Head                   ADULT SLP TREATMENT - 10/27/21 0845       General Information   Behavior/Cognition Alert;Cooperative;Pleasant mood      Treatment Provided   Treatment provided Cognitive-Linquistic      Cognitive-Linquistic Treatment   Treatment focused on Cognition;Patient/family/caregiver education    Skilled Treatment Pt provided many positive indications of improving cognition since last ST session, including ability to participate in conversation for longer periods of time (30+ mins), increased alertness, successfully translating into native language faster, improved orientation, and less headaches. Consistency with implementing SLP  recommendations and continuing established routine reported, including focus on diet and physical activity. SLP provided additional recommendations to aid cognitive functioning, including self-advocacy and intermittent pausing to aid processing. Plan to d/c next session pending good carryover of recommendations and continued progress.      Assessment / Recommendations / Plan   Plan Continue with current plan of care (anticipate d/c next session)     Progression Toward Goals   Progression toward goals Progressing toward goals              SLP Education - 10/27/21 1052     Education Details see above    Person(s) Educated Patient    Methods Explanation;Demonstration    Comprehension Verbalized understanding;Returned demonstration              SLP Short Term Goals - 07/28/21 0944       SLP SHORT TERM GOAL #1   Title Pt will utilize external/internal memory compensations to aid daily functioning given occasional min A over 2 sessions    Baseline 07-04-21, 07-21-21    Status Achieved      SLP SHORT TERM GOAL #2   Title Pt will maintain sustained and focused attention during 15+ minute conversation with used of targeted strategies given occasional A over 2 sessions    Baseline 07-04-21,  07-21-21    Status Achieved      SLP SHORT TERM GOAL #3   Title Pt will demonstrate ability to alternate attention successfully between conversation/structured tasks given occasional min A over 2 sessions    Status Deferred      SLP SHORT TERM GOAL #4   Title Pt will utilize anomia compensations in 30+ minute conversations given occasional min A over 2 sessions    Baseline 07-04-21, 07-21-21    Status Achieved              SLP Long Term Goals - 10/27/21 0845       SLP LONG TERM GOAL #1   Title Pt will utilize external/internal memory compensations to aid daily functioning given rare min A over 2 sessions    Baseline 07-28-21, 08-01-21    Status Achieved      SLP LONG TERM GOAL #2   Title  Pt will maintain sustained and focused attention during 30+ minute conversations given rare min A over 2 sessions    Baseline 07-28-21, 08-01-21    Status Achieved      SLP LONG TERM GOAL #3   Title Pt will accurately manage medications with no missed doses reported > 2 weeks given rare min A from family    Status Achieved      SLP LONG TERM GOAL #4   Title Pt will utilize anomia compensations in work presentations/business calls given rare min A over 2 sessions    Baseline 08-15-21, 09-22-21    Status Achieved      SLP LONG TERM GOAL #5   Title Pt will report improved cognitive linguistic functioning via PROM by 5 points at last ST session    Baseline Cognitive Function: 70    Time 6    Period Weeks    Status On-going   ongoing for recert     SLP LONG TERM GOAL #6   Title Pt will complete high level cognitive tasks with implementation of learned techniques to manage cognitive endurance given rare min A over 2 sessions    Baseline reduced cognitive edurance; 10-27-21    Time 6    Period Weeks    Status On-going   ongoing for recert     SLP LONG TERM GOAL #7   Title Pt will utilize processing/attention strategies in conversation and bilingual tasks to aid processing speed given rare min A over 2 sessions    Baseline delayed processing speed; 10-27-21    Time 6    Period Weeks    Status On-going      SLP LONG TERM GOAL #8   Title Pt will implement recall strategies to aid immediate and delayed recall of specific information extracted from auditory discourse given rare min A over 2 sessions    Baseline reduced recall of specific information; 10-27-21    Time 6    Period Weeks    Status On-going              Plan - 10/27/21 0845     Clinical Impression Statement Improvements with short term recall, attention, and processing reported, with overall improved functional independence and daily functioning reported. Ongoing carryover of SLP recommendations reported and demonstrated.  Continue skilled ST to return to PLOF, maximize professional success and QOL but anticipate d/c next ST session.    Speech Therapy Frequency 1x /week    Duration 8 weeks    Treatment/Interventions Compensatory strategies;Functional tasks;Cueing hierarchy;Environmental controls;Cognitive reorganization;Multimodal communcation approach;Language facilitation;Compensatory techniques;Internal/external aids;SLP  instruction and feedback    Potential to Achieve Goals Good    Potential Considerations Pain level    Consulted and Agree with Plan of Care Patient             Patient will benefit from skilled therapeutic intervention in order to improve the following deficits and impairments:   Cognitive communication deficit    Problem List Patient Active Problem List   Diagnosis Date Noted   Neck pain 06/09/2021   Post concussion syndrome 06/05/2021   Syncope 06/05/2021   Persistent cognitive impairment 06/05/2021   Head trauma 06/05/2021   Elevated troponin    LBBB (left bundle branch block)    Acute left-sided weakness 03/17/2020   Occipital headache 06/11/2018   left sided temporal headache 06/11/2018   Occipital neuralgia 06/11/2018   Vitiligo 11/14/2016   Hypertension 07/13/2014   Migraine 07/13/2014    Gracy Racer, CCC-SLP 10/27/2021, 10:54 AM  [facility] [address] [place], [place], [address] Phone: [phone]   Fax:  [phone]   Name: Sharon Friedman MRN: [id number] Date of Birth: 01/02/1971

## 2021-11-10 ENCOUNTER — Ambulatory Visit: Payer: 59 | Admitting: Physical Therapy

## 2021-11-17 ENCOUNTER — Ambulatory Visit: Payer: Medicaid Other | Attending: Neurology | Admitting: Physical Therapy

## 2021-11-17 ENCOUNTER — Ambulatory Visit: Payer: Medicaid Other

## 2021-11-17 DIAGNOSIS — R41841 Cognitive communication deficit: Secondary | ICD-10-CM | POA: Diagnosis present

## 2021-11-17 DIAGNOSIS — R42 Dizziness and giddiness: Secondary | ICD-10-CM | POA: Diagnosis not present

## 2021-11-17 NOTE — Therapy (Signed)
[facility] [address] [place], [place], [address] Phone: [phone]   Fax:  [phone]  Speech Language Pathology Treatment (Discharge Summary)  Patient Details  Name: Sharon Friedman MRN: [id number] Date of Birth: Dec 18, 1970 Referring Provider (SLP): Melvenia Beam, MD   Encounter Date: 11/17/2021   End of Session - 11/17/21 5597     Visit Number 15    Number of Visits 24    Date for SLP Re-Evaluation 11/18/21    Authorization Type Pt reported change to [facility] - 27 visits max combined    Authorization - Visit Number 4    SLP Start Time [id number]    SLP Stop Time  [phone]    SLP Time Calculation (min) 33 min    Activity Tolerance Patient tolerated treatment well             Past Medical History:  Diagnosis Date   Hypertension    Migraines     Past Surgical History:  Procedure Laterality Date   APPENDECTOMY     BREAST ENHANCEMENT SURGERY     DILATION AND CURETTAGE OF UTERUS     EYE SURGERY     lifted cornea (as child done for astigmatism)    There were no vitals filed for this visit.   Subjective Assessment - 11/17/21 0850     Subjective "I found the silver lining"    Currently in Pain? No/denies             SPEECH THERAPY DISCHARGE SUMMARY  Visits from Start of Care: 15  Current functional level related to goals / functional outcomes: Improvements in cognitive linguistic skills demonstrated and reported, including improved attention, awareness, and word retrieval. Pt able to independently implement recommended compensatory strategies to optimize cognitive functioning. Pt is pleased with current level of functioning and will plan to receive additional services as needed closer to new home location.     Remaining deficits: Intermittent concussion symptoms    Education / Equipment: Cognitive compensations, functional tasks and application, metacognitive strategies     Patient agrees to discharge. Patient goals were met. Patient is being discharged due to meeting the stated rehab goals..      ADULT SLP TREATMENT - 11/17/21 0831       General Information   Behavior/Cognition Alert;Cooperative;Pleasant mood      Treatment Provided   Treatment provided Cognitive-Linquistic      Cognitive-Linquistic Treatment   Treatment focused on Cognition;Patient/family/caregiver education    Skilled Treatment Pt reports idenitifcation of influential factors into cognitive and physical funcitoning, including lack of routine, stress, and sleep. Pt continues to utilize external aids (phone calendar) to aid recall and cognitive strategies to manage daily funcitoning and reduce concussion symptoms. Re-administered Cognitive Function PROM  with score of 112 (42 point improvement compared to initial score of 70). Pt is able to independently implement SLP recommendations, compensations, and strategies with ongoing success. Pt is pleased with current progress and agreeable to ST discharge today.      Assessment / Recommendations / Plan   Plan Discharge SLP treatment due to (comment)   POC complete     Progression Toward Goals   Progression toward goals Goals met, education completed, patient discharged from [place] Education - 11/17/21 0847     Education Details discharge summary, compensations/strategies to continue    Person(s) Educated Patient    Methods Explanation;Demonstration  Comprehension Verbalized understanding;Returned demonstration              SLP Short Term Goals - 07/28/21 0944       SLP SHORT TERM GOAL #1   Title Pt will utilize external/internal memory compensations to aid daily functioning given occasional min A over 2 sessions    Baseline 07-04-21, 07-21-21    Status Achieved      SLP SHORT TERM GOAL #2   Title Pt will maintain sustained and focused attention during 15+ minute conversation with used of targeted  strategies given occasional A over 2 sessions    Baseline 07-04-21, 07-21-21    Status Achieved      SLP SHORT TERM GOAL #3   Title Pt will demonstrate ability to alternate attention successfully between conversation/structured tasks given occasional min A over 2 sessions    Status Deferred      SLP SHORT TERM GOAL #4   Title Pt will utilize anomia compensations in 30+ minute conversations given occasional min A over 2 sessions    Baseline 07-04-21, 07-21-21    Status Achieved              SLP Long Term Goals - 11/17/21 0904       SLP LONG TERM GOAL #1   Title Pt will utilize external/internal memory compensations to aid daily functioning given rare min A over 2 sessions    Baseline 07-28-21, 08-01-21    Status Achieved      SLP LONG TERM GOAL #2   Title Pt will maintain sustained and focused attention during 30+ minute conversations given rare min A over 2 sessions    Baseline 07-28-21, 08-01-21    Status Achieved      SLP LONG TERM GOAL #3   Title Pt will accurately manage medications with no missed doses reported > 2 weeks given rare min A from family    Status Achieved      SLP LONG TERM GOAL #4   Title Pt will utilize anomia compensations in work presentations/business calls given rare min A over 2 sessions    Baseline 08-15-21, 09-22-21    Status Achieved      SLP LONG TERM GOAL #5   Title Pt will report improved cognitive linguistic functioning via PROM by 5 points at last ST session    Baseline Cognitive Function: 70; CF at discharge:112    Status Achieved      SLP LONG TERM GOAL #6   Title Pt will complete high level cognitive tasks with implementation of learned techniques to manage cognitive endurance given rare min A over 2 sessions    Baseline reduced cognitive edurance; 10-27-21, 11-17-21    Status Achieved      SLP LONG TERM GOAL #7   Title Pt will utilize processing/attention strategies in conversation and bilingual tasks to aid processing speed given rare min A  over 2 sessions    Baseline delayed processing speed; 10-27-21, 11-17-21    Status Achieved      SLP LONG TERM GOAL #8   Title Pt will implement recall strategies to aid immediate and delayed recall of specific information extracted from auditory discourse given rare min A over 2 sessions    Baseline reduced recall of specific information; 10-27-21, 11-17-21    Status Achieved              Plan - 11/17/21 0832     Clinical Impression Statement Slow but steady improvements with short term recall, attention,  and processing reported and demonstrated, with overall improved functional independence and daily functioning reported. Sucessful carryover and implementation of SLP recommendations reported and demonstrated with no further training required at this time. Discharge from skilled ST as POC complete and ST goals met.    Speech Therapy Frequency 1x /week    Duration 8 weeks    Treatment/Interventions Compensatory strategies;Functional tasks;Cueing hierarchy;Environmental controls;Cognitive reorganization;Multimodal communcation approach;Language facilitation;Compensatory techniques;Internal/external aids;SLP instruction and feedback    Potential to Achieve Goals Good    Potential Considerations Pain level    Consulted and Agree with Plan of Care Patient             Patient will benefit from skilled therapeutic intervention in order to improve the following deficits and impairments:   Cognitive communication deficit    Problem List Patient Active Problem List   Diagnosis Date Noted   Neck pain 06/09/2021   Post concussion syndrome 06/05/2021   Syncope 06/05/2021   Persistent cognitive impairment 06/05/2021   Head trauma 06/05/2021   Elevated troponin    LBBB (left bundle branch block)    Acute left-sided weakness 03/17/2020   Occipital headache 06/11/2018   left sided temporal headache 06/11/2018   Occipital neuralgia 06/11/2018   Vitiligo 11/14/2016   Hypertension  07/13/2014   Migraine 07/13/2014    Marzetta Board, [place] 11/17/2021, 9:26 AM  [facility] [address] [place], [place], [address] Phone: [phone]   Fax:  [phone]   Name: Sharon Friedman MRN: [id number] Date of Birth: May 05, 1970

## 2021-11-17 NOTE — Therapy (Signed)
OUTPATIENT PHYSICAL THERAPY TREATMENT NOTE   Patient Name: Sharon Friedman MRN: [id number] DOB:1970-08-26, 51 y.o., female Today's Date: 11/18/2021  PCP: Michael Boston, MD REFERRING PROVIDER: Sarina Ill, MD   PT End of Session - 11/18/21 1417     Visit Number 15    Number of Visits 17    Date for PT Re-Evaluation 11/10/21    Authorization Type [employer], $70 Copay, VL: 30 (combined) auth reqd for addl visits; pt reports on 09-01-21 that she now has [employer] as of 08-29-21    Authorization - Visit Number 4    Authorization - Number of Visits 27    PT Start Time 0810   pt arrived 10" late   PT Stop Time 0845    PT Time Calculation (min) 35 min    Activity Tolerance Patient tolerated treatment well    Behavior During Therapy [facility] for tasks assessed/performed                 Past Medical History:  Diagnosis Date   Hypertension    Migraines    Past Surgical History:  Procedure Laterality Date   APPENDECTOMY     BREAST ENHANCEMENT SURGERY     DILATION AND CURETTAGE OF UTERUS     EYE SURGERY     lifted cornea (as child done for astigmatism)   Patient Active Problem List   Diagnosis Date Noted   Neck pain 06/09/2021   Post concussion syndrome 06/05/2021   Syncope 06/05/2021   Persistent cognitive impairment 06/05/2021   Head trauma 06/05/2021   Elevated troponin    LBBB (left bundle branch block)    Acute left-sided weakness 03/17/2020   Occipital headache 06/11/2018   left sided temporal headache 06/11/2018   Occipital neuralgia 06/11/2018   Vitiligo 11/14/2016   Hypertension 07/13/2014   Migraine 07/13/2014    REFERRING DIAG: S09.90XA (ICD-10-CM) - Traumatic injury of head, initial encounter R53.83 (ICD-10-CM) - Other fatigue S13.4XXA (ICD-10-CM) - Whiplash injury to neck, initial encounter F07.81 (ICD-10-CM) - Post concussion syndrome M54.81 (ICD-10-CM) - Bilateral occipital neuralgia   THERAPY DIAG:  Dizziness and  giddiness  PERTINENT HISTORY:   Hypertension     Migraines      PRECAUTIONS: Fall;Other (comment)    Precaution Comments Fall, Migraines, HTN    SUBJECTIVE:  Pt reports she is feeling better but is not where she wants to be; had to fill out paperwork for 3 hours intermittently and it caused her to have a headache; pt has [facility] but states she would like to have 1 more follow up appt here at this clinic  PAIN Are you having pain? No   TODAY'S TREATMENT: 11-17-21   Gait:  [facility] PT Assessment - 11/18/21 0001       Functional Gait  Assessment   Gait assessed  Yes    Gait Level Surface Walks 20 ft in less than 7 sec but greater than 5.5 sec, uses assistive device, slower speed, mild gait deviations, or deviates 6-10 in outside of the 12 in walkway width.   10.07, 8.19   Change in Gait Speed Able to smoothly change walking speed without loss of balance or gait deviation. Deviate no more than 6 in outside of the 12 in walkway width.    Gait with Horizontal Head Turns Performs head turns smoothly with no change in gait. Deviates no more than 6 in outside 12 in walkway width   dizziness rating 2-3/10   Gait with  Vertical Head Turns Performs task with slight change in gait velocity (eg, minor disruption to smooth gait path), deviates 6 - 10 in outside 12 in walkway width or uses assistive device   dizziness rating 3-4/10   Gait and Pivot Turn Pivot turns safely within 3 sec and stops quickly with no loss of balance.    Step Over Obstacle Is able to step over 2 stacked shoe boxes taped together (9 in total height) without changing gait speed. No evidence of imbalance.    Gait with Narrow Base of Support Is able to ambulate for 10 steps heel to toe with no staggering.    Gait with Eyes Closed Walks 20 ft, no assistive devices, good speed, no evidence of imbalance, normal gait pattern, deviates no more than 6 in outside 12 in walkway width. Ambulates 20 ft in less than 7 sec.     Ambulating Backwards Walks 20 ft, no assistive devices, good speed, no evidence for imbalance, normal gait    Steps Alternating feet, no rail.    Total Score 28            NeuroRe-ed;   Vestibular Assessment - 11/18/21 0001       Positional Sensitivities   Sit to Supine No dizziness    Supine to Left Side No dizziness    Supine to Right Side No dizziness    Supine to Sitting No dizziness    Nose to Right Knee Mild dizziness    Nose to Left Knee Mild dizziness    Left Knee to Sitting No dizziness    Head Turning x 5 No dizziness    Head Nodding x 5 Mild dizziness    Pivot Right in Standing No dizziness    Pivot Left in Standing No dizziness    Rolling Right No dizziness    Rolling Left No dizziness                PATIENT EDUCATION: Education details:  Med[employer] VARADJDX;  pt instructed to continue x1 viewing exercise in standing on FLOOR & try to increase duration to 60 secs (instructed not to perform on compliant surface at home yet);  also instructed pt to add head turns with EO and EC while gently bouncing on physioball; HEP unchanged on 08-22-21 Person educated: Patient Education method: Acupuncturist Education comprehension: returned demonstration and verbal cues required   HOME EXERCISE PROGRAM: [employer] VARADJDX (issued on 08-01-21); previous HEP [id number] ([place])    SHORT TERM GOALS:  (3 weeks) Target date: 10/07/21 (pushed out due to start date of 09-22-21 due to schedule availability)   FGA TBA and LTG to be set as applicable Baseline: TBA Goal status: Achieved - 08-15-21  2.  Pt will report 25% improvement in L shoulder/arm pain with functional activities Baseline: no improvement; significant pain limiting activities Goal status: Partially met - inconsistent - 10-06-21  3.  Pt will report </= 3/5 for all movements on MSQ to indicate improvement in motion sensitivity and improved activity tolerance.  Baseline: 2-4 Goal status: Goal  met 10-06-21; dizziness varies depending on if pt has a HA; no HA reported in today's session, therefore, less dizziness reported with activity   LONG TERM GOALS: (6 weeks) Target date: 11/10/2020  Pt will be independent with final/progressive HEP for cervical/vestibular/balance Baseline: HEP established,completing inconsistently Goal status:  MET - 11-17-21  2.  Pt will improve all cervical AROM by 5 deg and report </= 2/10 pain with neck movement Baseline:  A/PROM AROM (06/10/21) A/PROM (deg) 07/25/2021  Flexion 42 37 (increased mod - severe pain on the L side)   Extension 38 38 (denies pain)  Right lateral flexion 28 35 (feels like a stretch)   Left lateral flexion 32 35 (moderate pain with completion)  Right rotation 44 42 (stretching)  Left rotation 46 39 (moderate pain with completion)          Cervical ROM is WNL's for all motions ; no pain reported  Goal status: Met 11-17-21  3.  Pt will report return to cycling/exercise program and report ability to complete >/= 10  minutes with reports of </= 5/10 dizziness Baseline: doing 30" - reports initial dizziness 3-4/10 and dizziness subsides - 11-17-21 Goal status: MET  4.  Pt will report </= 2/5 for all movements on MSQ to indicate improvement in motion sensitivity and improved activity tolerance.  Baseline: 2-4/5 for all components Goal status: Goal met 11-17-21  5.  Pt will improve FGA by 4 points from baseline to demonstrate improved balance and reduced fall risk Baseline: score 19/30 on 08-15-21; score 28/30 Goal status: Met    6.  Improve DHI score from 72% to </= 36% to demo improvement in dizziness for improved quality of life. Baseline:  72% on 09-22-21; 42% on 11-17-21 Goal status:  Progressing - ongoing     Plan - 07/25/21 1016     Clinical Impression Statement Pt has made significant progress in improving mobility and reports less dizziness with motion.  DHI score has significantly improved from 72% to 42%, improving  from severe disability to moderate disability self perceived rating scale.  FGA has improved from 19/30 to 28/30 with pt continuing to have slightly decreased walking speed and slight unsteadiness with amb. With vertical head turns.  Plan for 1 visit to update HEP as needed and D/C.    Personal Factors and Comorbidities Comorbidity 2    Comorbidities Migraines, HTN    Examination-Activity Limitations Carry;Lift    Examination-Participation Restrictions Occupation;Community Activity;Cleaning    Stability/Clinical Decision Making Stable/Uncomplicated    Rehab Potential Good    PT Frequency 1x / week    PT Duration 6 weeks    PT Treatment/Interventions ADLs/Self Care Home Management;Aquatic Therapy;Canalith Repostioning;Moist Heat;Cryotherapy;Therapeutic activities;Functional mobility training;Electrical Stimulation;Iontophoresis 69m/ml Dexamethasone;Stair training;Gait training;Therapeutic exercise;Balance training;Neuromuscular re-education;Patient/family education;Manual techniques;Taping;Dry needling;Vestibular;Passive range of motion;Joint Manipulations;Spinal Manipulations    PT Next Visit Plan Continue balance and vestibular exercises - add cognitive task with multi-tasking with gait   Consulted and Agree with Plan of Care Patient             DLajuana RippleLJenness Corner PT  10/07/21, 10:31 AM [facility] [place] [phone]

## 2021-11-18 ENCOUNTER — Encounter: Payer: Self-pay | Admitting: Physical Therapy

## 2021-12-21 ENCOUNTER — Telehealth: Payer: Self-pay | Admitting: Neurology

## 2021-12-21 ENCOUNTER — Ambulatory Visit: Payer: Medicaid Other | Attending: Neurology

## 2021-12-21 ENCOUNTER — Ambulatory Visit (INDEPENDENT_AMBULATORY_CARE_PROVIDER_SITE_OTHER): Payer: Self-pay | Admitting: Neurology

## 2021-12-21 DIAGNOSIS — M6281 Muscle weakness (generalized): Secondary | ICD-10-CM | POA: Diagnosis present

## 2021-12-21 DIAGNOSIS — R42 Dizziness and giddiness: Secondary | ICD-10-CM | POA: Diagnosis present

## 2021-12-21 DIAGNOSIS — R293 Abnormal posture: Secondary | ICD-10-CM | POA: Diagnosis present

## 2021-12-21 DIAGNOSIS — Z91199 Patient's noncompliance with other medical treatment and regimen due to unspecified reason: Secondary | ICD-10-CM

## 2021-12-21 NOTE — Progress Notes (Signed)
Second no show for patient appointment today.  She has an 32% no-show rate in epic.  If patient calls back please have a talk with her, we have a lot of patients awaiting appointments and its really not fair to them when people just do not show up, please ask her not to make an appointment unless she intends on coming.  Something could have happened today and if so we totally understand  but if she no-shows again she will be dismissed from our practice.

## 2021-12-21 NOTE — Telephone Encounter (Addendum)
Second no show for patient appointment today.  She has an 32% no-show rate in epic.  If patient calls back please have a talk with her, we have a lot of patients awaiting appointments and its really not fair to them when people just do not show up, please ask her not to make an appointment unless she intends on coming.  Something could have happened today and if so we totally understand  but if she no-shows again she will be dismissed from our practice. IF patient calls to reschedule please see Dr. Lucia Gaskins first. thanks  Angie, first no show fee was waived.

## 2021-12-21 NOTE — Therapy (Signed)
OUTPATIENT PHYSICAL THERAPY TREATMENT NOTE/DISCHARGE SUMMARY/RE-CERT TO COVER D/C VISIT   Patient Name: Sharon Friedman MRN: [id number] DOB:27-Sep-1970, 51 y.o., female Today's Date: 12/21/2021  PCP: Michael Boston, MD REFERRING PROVIDER: Sarina Ill, MD  PHYSICAL THERAPY DISCHARGE SUMMARY  Visits from Start of Care: 16  Current functional level related to goals / functional outcomes: See Clinical Impression Statement   Remaining deficits: Headaches, Mild Imbalance, Residual Dizziness   Education / Equipment: HEP   Patient agrees to discharge. Patient goals were met. Patient is being discharged due to meeting the stated rehab goals.    PT End of Session - 12/21/21 0800     Visit Number 16    Number of Visits 17    Date for PT Re-Evaluation 12/22/21    Authorization Type [employer], $70 Copay, VL: 30 (combined) auth reqd for addl visits; pt reports on 09-01-21 that she now has [employer] as of 08-29-21    Authorization - Visit Number 5    Authorization - Number of Visits 27    PT Start Time 0801    PT Stop Time 0840    PT Time Calculation (min) 39 min    Activity Tolerance Patient tolerated treatment well    Behavior During Therapy WFL for tasks assessed/performed             Past Medical History:  Diagnosis Date   Hypertension    Migraines    Past Surgical History:  Procedure Laterality Date   APPENDECTOMY     BREAST ENHANCEMENT SURGERY     DILATION AND CURETTAGE OF UTERUS     EYE SURGERY     lifted cornea (as child done for astigmatism)   Patient Active Problem List   Diagnosis Date Noted   Neck pain 06/09/2021   Post concussion syndrome 06/05/2021   Syncope 06/05/2021   Persistent cognitive impairment 06/05/2021   Head trauma 06/05/2021   Elevated troponin    LBBB (left bundle branch block)    Acute left-sided weakness 03/17/2020   Occipital headache 06/11/2018   left sided temporal headache 06/11/2018   Occipital neuralgia  06/11/2018   Vitiligo 11/14/2016   Hypertension 07/13/2014   Migraine 07/13/2014    REFERRING DIAG: S09.90XA (ICD-10-CM) - Traumatic injury of head, initial encounter R53.83 (ICD-10-CM) - Other fatigue S13.4XXA (ICD-10-CM) - Whiplash injury to neck, initial encounter F07.81 (ICD-10-CM) - Post concussion syndrome M54.81 (ICD-10-CM) - Bilateral occipital neuralgia   THERAPY DIAG:  Dizziness and giddiness  Abnormal posture  Muscle weakness (generalized)  PERTINENT HISTORY:   Hypertension     Migraines      PRECAUTIONS: Fall;Other (comment)    Precaution Comments Fall, Migraines, HTN    SUBJECTIVE:  Patient reports she has been doing good, other than the headaches. Reports they have been coming and going. Reports having them 3-4x/weeks. Had appt with Neurology but was late. Upset because she drove from [facility] for that appt.   PAIN Are you having pain? No   TODAY'S TREATMENT: Gaze Stabilization:  VOR x 1 Horizontal: standing bil stance on firm surface > airex, x 60 seconds x 2 reps VOR x 1 Vertical: standing bil stance on firm surface > airex, x 60 seconds x 2 reps  Progressed VOR HEP: (see below) Gaze Stabilization: Standing Feet Apart (Compliant Surface)    Feet apart on pillow, keeping eyes on target on wall 2-3 feet away, tilt head down 15-30 and move head side to side for 60 seconds. Repeat while moving head  up and down for 40 seconds. Do 2 sessions per day.  Bouncing on Physioball:  Seated on physioball, completed gentle bouncing with addition of eyes closed and head turns and head nods, completed 2 x 30 seconds each.     Marching on Airex:  While standing on airex with gentle marching, complete the the following while marching:              - Eyes Open with Horizontal Head Turns x 10 reps - Eyes Open with Horizontal Head Nods x 10 reps - Eyes Closed (No Head Movement) x 30 seconds   Updated Balance HEP:  Access Code: [email or web address]  URL:  [email or web address] Date: 12/21/2021 Prepared by: Baldomero Lamy  Exercises - Standing Balance with Eyes Closed on Foam  - 1 x daily - 5 x weekly - 1 sets - 3 reps - 30 seconds hold - Standing on Foam Pad with Head Rotation and Eyes Closed  - 1 x daily - 5 x weekly - 1 sets - 10 reps - Standing on Foam Pad with Head Rotation and Eyes Closed  - 1 x daily - 5 x weekly - 1 sets - 10 reps   PATIENT EDUCATION: Education details: See Above, Updated HEP. D/C this visit. How to obtain information from medical records if she needs to transfer care due to moving to [place] educated: Patient Education method: Pharmacist, hospital comprehension: returned demonstration and verbal cues required   HOME EXERCISE PROGRAM: [employer] VARADJDX    SHORT TERM GOALS:  (3 weeks) Target date: 10/07/21 (pushed out due to start date of 09-22-21 due to schedule availability)   FGA TBA and LTG to be set as applicable Baseline: TBA Goal status: Achieved - 08-15-21  2.  Pt will report 25% improvement in L shoulder/arm pain with functional activities Baseline: no improvement; significant pain limiting activities Goal status: Partially met - inconsistent - 10-06-21  3.  Pt will report </= 3/5 for all movements on MSQ to indicate improvement in motion sensitivity and improved activity tolerance.  Baseline: 2-4 Goal status: Goal met 10-06-21; dizziness varies depending on if pt has a HA; no HA reported in today's session, therefore, less dizziness reported with activity   LONG TERM GOALS: (6 weeks) Target date: 11/10/2020  Pt will be independent with final/progressive HEP for cervical/vestibular/balance Baseline: HEP established,completing inconsistently; reports independence with updated HEP Goal status:  MET - 12-21-2021  2.  Pt will improve all cervical AROM by 5 deg and report </= 2/10 pain with neck movement Baseline:  A/PROM AROM (06/10/21) A/PROM (deg) 07/25/2021   Flexion 42 37 (increased mod - severe pain on the L side)   Extension 38 38 (denies pain)  Right lateral flexion 28 35 (feels like a stretch)   Left lateral flexion 32 35 (moderate pain with completion)  Right rotation 44 42 (stretching)  Left rotation 46 39 (moderate pain with completion)          Cervical ROM is WNL's for all motions ; no pain reported  Goal status: Met 11-17-21  3.  Pt will report return to cycling/exercise program and report ability to complete >/= 10  minutes with reports of </= 5/10 dizziness Baseline: doing 30" - reports initial dizziness 3-4/10 and dizziness subsides - 11-17-21 Goal status: MET  4.  Pt will report </= 2/5 for all movements on MSQ to indicate improvement in motion sensitivity and improved activity tolerance.  Baseline: 2-4/5 for all components Goal status: Goal met 11-17-21  5.  Pt will improve FGA by 4 points from baseline to demonstrate improved balance and reduced fall risk Baseline: score 19/30 on 08-15-21; score 28/30 Goal status: Met    6.  Improve DHI score from 72% to </= 36% to demo improvement in dizziness for improved quality of life. Baseline:  72% on 09-22-21; 42% on 11-17-21 Goal status:  Progressing - ongoing     Plan - 07/25/21 1016     Clinical Impression Statement Today's skilled PT session focused on entire review and update of HEP, patient tolerating progression well. Provided new handout and educated patient on possible progressions that can be completed for self management of symptoms.  Has made progress with PT services and ability to meet LTGs. Pt agreeable to d/c today.    Personal Factors and Comorbidities Comorbidity 2    Comorbidities Migraines, HTN    Examination-Activity Limitations Carry;Lift    Examination-Participation Restrictions Occupation;Community Activity;Cleaning    Stability/Clinical Decision Making Stable/Uncomplicated    Rehab Potential Good    PT Frequency 1x / week    PT Duration 6 weeks    PT  Treatment/Interventions ADLs/Self Care Home Management;Aquatic Therapy;Canalith Repostioning;Moist Heat;Cryotherapy;Therapeutic activities;Functional mobility training;Electrical Stimulation;Iontophoresis 4m/ml Dexamethasone;Stair training;Gait training;Therapeutic exercise;Balance training;Neuromuscular re-education;Patient/family education;Manual techniques;Taping;Dry needling;Vestibular;Passive range of motion;Joint Manipulations;Spinal Manipulations    PT Next Visit Plan D/c this visit    Consulted and Agree with Plan of Care Patient             BKirtland Bouchard PT, DPT 10/07/21, 10:31 AM [facility], [place] [phone]

## 2021-12-21 NOTE — Patient Instructions (Addendum)
Gaze Stabilization: Standing Feet Apart (Compliant Surface)    Feet apart on pillow, keeping eyes on target on wall 2-3 feet away, tilt head down 15-30 and move head side to side for 60 seconds. Repeat while moving head up and down for 40 seconds. Do 2 sessions per day.    Bouncing on Physioball:  Seated on physioball, completed gentle bouncing with addition of eyes closed and head turns and head nods, completed 2 x 30 seconds each.    Marching on Airex:  While standing on airex with gentle marching, complete the the following while marching:   - Eyes Open with Horizontal Head Turns x 10 reps - Eyes Open with Horizontal Head Nods x 10 reps - Eyes Closed (No Head Movement) x 30 seconds

## 2022-01-04 ENCOUNTER — Encounter: Payer: Self-pay | Admitting: Neurology

## 2022-01-04 ENCOUNTER — Ambulatory Visit: Payer: Medicaid Other | Admitting: Neurology

## 2022-01-04 VITALS — BP 137/90 | HR 65 | Ht 61.0 in | Wt 135.8 lb

## 2022-01-04 DIAGNOSIS — S069XAD Unspecified intracranial injury with loss of consciousness status unknown, subsequent encounter: Secondary | ICD-10-CM

## 2022-01-04 DIAGNOSIS — H814 Vertigo of central origin: Secondary | ICD-10-CM

## 2022-01-04 DIAGNOSIS — R42 Dizziness and giddiness: Secondary | ICD-10-CM

## 2022-01-04 DIAGNOSIS — G43709 Chronic migraine without aura, not intractable, without status migrainosus: Secondary | ICD-10-CM

## 2022-01-04 DIAGNOSIS — F0781 Postconcussional syndrome: Secondary | ICD-10-CM

## 2022-01-04 DIAGNOSIS — R569 Unspecified convulsions: Secondary | ICD-10-CM

## 2022-01-04 DIAGNOSIS — H832X9 Labyrinthine dysfunction, unspecified ear: Secondary | ICD-10-CM

## 2022-01-04 DIAGNOSIS — R9401 Abnormal electroencephalogram [EEG]: Secondary | ICD-10-CM

## 2022-01-04 DIAGNOSIS — G8929 Other chronic pain: Secondary | ICD-10-CM

## 2022-01-04 DIAGNOSIS — R519 Headache, unspecified: Secondary | ICD-10-CM

## 2022-01-04 MED ORDER — TOPIRAMATE 25 MG PO TABS: 25.0000 mg | ORAL_TABLET | Freq: Every evening | ORAL | 3 refills | Status: DC

## 2022-01-04 NOTE — Patient Instructions (Addendum)
Occipital neuralgia headache First-line treatment is occipital nerve blocks EEG and occipital nerve block in the office [facility]  Marlee at [facility] for first botox and if we can get it approved  Botox for migraine is an option need to try topiramate, amitriptyline/nortriptyline and/or propranolol - start with topiramate try it and let me know and we can try all these medications prior to trying to get botox for migraine approved      Occipital Neuralgia  Occipital neuralgia is a type of headache that causes brief episodes of very bad pain in the back of the head. Pain from occipital neuralgia may spread (radiate) to other parts of the head. These headaches may be caused by irritation of the nerves that leave the spinal cord high up in the neck, just below the base of the skull (occipital nerves). The occipital nerves transmit sensations from the back of the head, the top of the head, and the areas behind the ears. What are the causes? This condition can occur without any known cause (primary headache syndrome). In other cases, this condition is caused by pressure on or irritation of one of the two occipital nerves. Pressure and irritation may be due to: Muscle spasm in the neck. Neck injury. Wear and tear of the vertebrae in the neck (osteoarthritis). Disease of the disks that separate the vertebrae. Swollen blood vessels that put pressure on the occipital nerves. Infections. Tumors. Diabetes. What are the signs or symptoms? This condition causes brief burning, stabbing, electric, shocking, or shooting pain in the back of the head that can radiate to the top of the head. It can happen on one side or both sides of the head. It can also cause: Pain behind the eye. Pain triggered by neck movement or hair brushing. Scalp tenderness. Aching in the back of the head between episodes of very bad pain. Pain that gets worse with exposure to bright lights. How is this diagnosed? Your  health care provider may diagnose the condition based on a physical exam and your symptoms. Tests may be done, such as: Imaging studies of the brain and neck (cervical spine), such as an MRI or CT scan. These look for causes of pinched nerves. Applying pressure to the nerves in the neck to try to re-create the pain. Injection of numbing medicine into the occipital nerve areas to see if pain goes away (diagnostic nerve block). How is this treated? Treatment for this condition may begin with simple measures, such as: Rest. Massage. Applying heat or cold to the area. Over-the-counter pain relievers. If these measures do not work, you may need other treatments, including: Medicines, such as: Prescription-strength anti-inflammatory medicines. Muscle relaxants. Anti-seizure medicines, which can relieve pain. Antidepressants, which can relieve pain. Injected medicines, such as medicines that numb the area (local anesthetic) and steroids. Pulsed radiofrequency ablation. This is when wires are implanted to deliver electrical impulses that block pain signals from the occipital nerve. Surgery to relieve nerve pressure. Physical therapy. Follow these instructions at home: Managing pain     Avoid any activities that cause pain. Rest when you have an attack of pain. Try gentle massage to relieve pain. Try a different pillow or sleeping position. If directed, apply heat to the affected area as often as told by your health care provider. Use the heat source that your health care provider recommends, such as a moist heat pack or a heating pad. Place a towel between your skin and the heat source. Leave the heat on  for 20-30 minutes. Remove the heat if your skin turns bright red. This is especially important if you are unable to feel pain, heat, or cold. You have a greater risk of getting burned. If directed, put ice on the back of your head and neck area. To do this: Put ice in a plastic bag. Place  a towel between your skin and the bag. Leave the ice on for 20 minutes, 2-3 times a day. Remove the ice if your skin turns bright red. This is very important. If you cannot feel pain, heat, or cold, you have a greater risk of damage to the area. General instructions Take over-the-counter and prescription medicines only as told by your health care provider. Avoid things that make your symptoms worse, such as bright lights. Try to stay active. Get regular exercise that does not cause pain. Ask your health care provider to suggest safe exercises for you. Work with a physical therapist to learn stretching exercises you can do at home. Practice good posture. Keep all follow-up visits. This is important. Contact a health care provider if: Your medicine is not working. You have new or worsening symptoms. Get help right away if: You have very bad head pain that does not go away. You have a sudden change in vision, balance, or speech. These symptoms may represent a serious problem that is an emergency. Do not wait to see if the symptoms will go away. Get medical help right away. Call your local emergency services (911 in the [place] Do not drive yourself to the hospital. Summary Occipital neuralgia is a type of headache that causes brief episodes of very bad pain in the back of the head. Pain from occipital neuralgia may spread (radiate) to other parts of the head. Treatment for this condition includes rest, massage, and medicines. This information is not intended to replace advice given to you by your health care provider. Make sure you discuss any questions you have with your health care provider. Document Revised: 02/15/2020 Document Reviewed: 02/15/2020 Elsevier Patient Education  2023 [employer]  Occipital Nerve Block Patient Information  Description: The occipital nerves originate in the cervical (neck) spinal cord and travel upward through muscle and tissue to supply sensation to the  back of the head and top of the scalp.  In addition, the nerves control some of the muscles of the scalp.  Occipital neuralgia is an irritation of these nerves which can cause headaches, numbness of the scalp, and neck discomfort.     The occipital nerve block will interrupt nerve transmission through these nerves and can relieve pain and spasm.  The block consists of insertion of a small needle under the skin in the back of the head to deposit local anesthetic (numbing medicine) and/or steroids around the nerve.  The entire block usually lasts less than 5 minutes.  Conditions which may be treated by occipital blocks:  Muscular pain and spasm of the scalp Nerve irritation, back of the head Headaches Upper neck pain  Possible side-effects:  Bleeding from needle site Infection (rare, may require surgery) Nerve injury (rare) Hair on back of neck can be tinged with iodine scrub (this will wash out) Light-headedness (temporary) Pain at injection site (several days) Decreased blood pressure (rare, temporary) Seizure (very rare)  Call if you experience:  Hives or difficulty breathing ( go to the emergency room) Inflammation or drainage at the injection site(s)  Please note:  Although the local anesthetic injected can often make your painful muscles or  headache feel good for several hours after the injection, the pain may return.  It takes 3-7 days for steroids to work.  You may not notice any pain relief for at least one week.  If effective, we will often do a series of injections spaced 3-6 weeks apart to maximally decrease your pain.  Topiramate Tablets What is this medication? TOPIRAMATE (toe PYRE a mate) prevents and controls seizures in people with epilepsy. It may also be used to prevent migraine headaches. It works by calming overactive nerves in your body. This medicine may be used for other purposes; ask your health care provider or pharmacist if you have questions. COMMON BRAND  NAME(S): Topamax, Topiragen What should I tell my care team before I take this medication? They need to know if you have any of these conditions: Bleeding disorder Kidney disease Lung disease Suicidal thoughts, plans, or attempt by you or a family member An unusual or allergic reaction to topiramate, other medications, foods, dyes, or preservatives Pregnant or trying to get pregnant Breast-feeding How should I use this medication? Take this medication by mouth with water. Take it as directed on the prescription label at the same time every day. Do not cut, crush or chew this medicine. Swallow the tablets whole. You can take it with or without food. If it upsets your stomach, take it with food. Keep taking it unless your care team tells you to stop. A special MedGuide will be given to you by the pharmacist with each prescription and refill. Be sure to read this information carefully each time. Talk to your care team about the use of this medication in children. While it may be prescribed for children as young as 2 years for selected conditions, precautions do apply. Overdosage: If you think you have taken too much of this medicine contact a poison control center or emergency room at once. NOTE: This medicine is only for you. Do not share this medicine with others. What if I miss a dose? If you miss a dose, take it as soon as you can unless it is within 6 hours of the next dose. If it is within 6 hours of the next dose, skip the missed dose. Take the next dose at the normal time. Do not take double or extra doses. What may interact with this medication? Acetazolamide Alcohol Antihistamines for allergy, cough, and cold Aspirin and aspirin-like medications Atropine Certain medications for anxiety or sleep Certain medications for bladder problems, such as oxybutynin, tolterodine Certain medications for depression, such as amitriptyline, fluoxetine, sertraline Certain medications for Parkinson  disease, such as benztropine, trihexyphenidyl Certain medications for seizures, such as carbamazepine, lamotrigine, phenobarbital, phenytoin, primidone, valproic acid, zonisamide Certain medications for stomach problems, such as dicyclomine, hyoscyamine Certain medications for travel sickness, such as scopolamine Certain medications that treat or prevent blood clots, such as warfarin, enoxaparin, dalteparin, apixaban, dabigatran, rivaroxaban Digoxin Diltiazem Estrogen and progestin hormones General anesthetics, such as halothane, isoflurane, methoxyflurane, propofol Glyburide Hydrochlorothiazide Ipratropium Lithium Medications that relax muscles Metformin NSAIDs, medications for pain and inflammation, such as ibuprofen or naproxen Opioid medications for pain Phenothiazines, such as chlorpromazine, mesoridazine, prochlorperazine, thioridazine Pioglitazone This list may not describe all possible interactions. Give your health care provider a list of all the medicines, herbs, non-prescription drugs, or dietary supplements you use. Also tell them if you smoke, drink alcohol, or use illegal drugs. Some items may interact with your medicine. What should I watch for while using this medication? Visit your care team for  regular checks on your progress. Tell your care team if your symptoms do not start to get better or if they get worse. Do not suddenly stop taking this medication. You may develop a severe reaction. Your care team will tell you how much medication to take. If your care team wants you to stop the medication, the dose may be slowly lowered over time to avoid any side effects. Wear a medical ID bracelet or chain. Carry a card that describes your condition. List the medications and doses you take on the card. This medication may affect your coordination, reaction time, or judgment. Do not drive or operate machinery until you know how this medication affects you. Sit up or stand slowly to  reduce the risk of dizzy or fainting spells. Drinking alcohol with this medication can increase the risk of these side effects. This medication may cause serious skin reactions. They can happen weeks to months after starting the medication. Contact your care team right away if you notice fevers or flu-like symptoms with a rash. The rash may be red or purple and then turn into blisters or peeling of the skin. You may also notice a red rash with swelling of the face, lips, or lymph nodes in your neck or under your arms. This medication may cause thoughts of suicide or depression. This includes sudden changes in mood, behaviors, or thoughts. These changes can happen at any time but are more common in the beginning of treatment or after a change in dose. Call your care team right away if you experience these thoughts or worsening depression. This medication may slow your child's growth if it is taken for a long time at high doses. Your child's care team will monitor your child's growth. Using this medication for a long time may weaken your bones. The risk of bone fractures may be increased. Talk to your care team about your bone health. Discuss this medication with your care team if you may be pregnant. Serious birth defects can occur if you take this medication during pregnancy. There are benefits and risks to taking medications during pregnancy. Your care team can help you find the option that works for you. Contraception is recommended while taking this medication. Estrogen and progestin hormones may not work as well while you are taking this medication. Your care team can help you find the option that works for you. Talk to your care team before breastfeeding. Changes to your treatment plan may be needed. What side effects may I notice from receiving this medication? Side effects that you should report to your care team as soon as possible: Allergic reactions--skin rash, itching, hives, swelling of the  face, lips, tongue, or throat High acid level--trouble breathing, unusual weakness or fatigue, confusion, headache, fast or irregular heartbeat, nausea, vomiting High ammonia level--unusual weakness or fatigue, confusion, loss of appetite, nausea, vomiting, seizures Fever that does not go away, decrease in sweat Kidney stones--blood in the urine, pain or trouble passing urine, pain in the lower back or sides Redness, blistering, peeling or loosening of the skin, including inside the mouth Sudden eye pain or change in vision such as blurry vision, seeing halos around lights, vision loss Thoughts of suicide or self-harm, worsening mood, feelings of depression Side effects that usually do not require medical attention (report to your care team if they continue or are bothersome): Burning or tingling sensation in hands or feet Difficulty with paying attention, memory, or speech Dizziness Drowsiness Fatigue Loss of appetite with weight  loss Slow or sluggish movements of the body This list may not describe all possible side effects. Call your doctor for medical advice about side effects. You may report side effects to FDA at 1-800-FDA-1088. Where should I keep my medication? Keep out of the reach of children and pets. Store between 15 and 30 degrees C (59 and 86 degrees F). Protect from moisture. Keep the container tightly closed. Get rid of any unused medication after the expiration date. To get rid of medications that are no longer needed or have expired: Take the medication to a medication take-back program. Check with your pharmacy or law enforcement to find a location. If you cannot return the medication, check the label or package insert to see if the medication should be thrown out in the garbage or flushed down the toilet. If you are not sure, ask your care team. If it is safe to put it in the trash, empty the medication out of the container. Mix the medication with cat litter, dirt, coffee  grounds, or other unwanted substance. Seal the mixture in a bag or container. Put it in the trash. NOTE: This sheet is a summary. It may not cover all possible information. If you have questions about this medicine, talk to your doctor, pharmacist, or health care provider.  2023 Elsevier/Gold Standard (2007-06-08 00:00:00)
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
## 2022-01-09 ENCOUNTER — Telehealth: Payer: Self-pay | Admitting: Neurology

## 2022-01-09 ENCOUNTER — Ambulatory Visit (INDEPENDENT_AMBULATORY_CARE_PROVIDER_SITE_OTHER): Payer: Medicaid Other | Admitting: Neurology

## 2022-01-09 DIAGNOSIS — R569 Unspecified convulsions: Secondary | ICD-10-CM

## 2022-01-09 DIAGNOSIS — S069XAD Unspecified intracranial injury with loss of consciousness status unknown, subsequent encounter: Secondary | ICD-10-CM

## 2022-01-09 DIAGNOSIS — R9401 Abnormal electroencephalogram [EEG]: Secondary | ICD-10-CM

## 2022-01-09 NOTE — Telephone Encounter (Signed)
healthy blue Sharon Friedman: [id number] exp. 01/09/22-03/09/22 sent to [facility]

## 2022-01-10 ENCOUNTER — Telehealth: Payer: Self-pay | Admitting: Neurology

## 2022-01-10 NOTE — Procedures (Signed)
    History:  51 year old woman with concussion and history of transient AMS  EEG classification: Awake and drowsy  Description of the recording: The background rhythms of this recording consists of a fairly well modulated medium amplitude alpha rhythm of 10-11 Hz that is reactive to eye opening and closure. As the record progresses, the patient appears to remain in the waking state throughout the recording. Photic stimulation was performed, did not show any abnormalities. Hyperventilation was also performed, did not show any abnormalities. Toward the end of the recording, the patient enters the drowsy state with slight symmetric slowing seen. The patient never enters stage II sleep. There was presence of left temporal sharps. There was intermittent left focal slowing. EKG monitor shows no evidence of cardiac rhythm abnormalities with a heart rate of 60.  Abnormality:  1. Left temporal sharps   2. Left focal slowing   Impression: This is an abnormal EEG recording in the waking and drowsy state due to presence of left temporal sharps and left focal slowing. Left temporal sharps are consistent with an area of epileptogenic potential in the left temporal region and left focal slowing is also consistent with an area of neuronal dysfunction in the left temporal region.    Windell Norfolk, MD [facility]

## 2022-01-10 NOTE — Telephone Encounter (Signed)
Sent referral to [facility].  Requested clinic [facility] concussion clinic) could not be found. Confirmed with [facility] have a physician treats concussion.  Dr. Lucia Gaskins do you approve?

## 2022-01-12 ENCOUNTER — Telehealth: Payer: Self-pay | Admitting: Neurology

## 2022-01-12 NOTE — Telephone Encounter (Signed)
Pattricia Boss, per Dr. Lucia Gaskins, please fax them the EEG results that just became available as well. Thanks

## 2022-01-12 NOTE — Telephone Encounter (Signed)
Informed pt nurse will check with Dr. Lucia Gaskins approve scheduling injection on 01/23/22 at 4 pm

## 2022-01-12 NOTE — Telephone Encounter (Signed)
Pt would like a call back to discuss scheduling a 4 pm appt on 12/22/21. Had to cancel appt scheduled on 12/16/21. Would like a call from the nurse.

## 2022-01-12 NOTE — Telephone Encounter (Signed)
Yes she would like to see if can get an appt next Monday 01/23/22 at 4 pm

## 2022-01-13 NOTE — Telephone Encounter (Signed)
Spoke with patient. She accepted appt for nerve block on 9/25 at 4 pm.

## 2022-01-16 ENCOUNTER — Ambulatory Visit: Payer: Medicaid Other | Admitting: Neurology

## 2022-01-23 ENCOUNTER — Telehealth: Payer: Self-pay | Admitting: Neurology

## 2022-01-23 ENCOUNTER — Ambulatory Visit (HOSPITAL_COMMUNITY)
Admission: RE | Admit: 2022-01-23 | Discharge: 2022-01-23 | Disposition: A | Discharge status: Home / Self Care | Payer: Medicaid Other | Source: Ambulatory Visit | Attending: Neurology | Admitting: Neurology

## 2022-01-23 ENCOUNTER — Ambulatory Visit: Payer: Medicaid Other | Admitting: Neurology

## 2022-01-23 DIAGNOSIS — S069XAD Unspecified intracranial injury with loss of consciousness status unknown, subsequent encounter: Secondary | ICD-10-CM | POA: Diagnosis present

## 2022-01-23 DIAGNOSIS — H832X9 Labyrinthine dysfunction, unspecified ear: Secondary | ICD-10-CM | POA: Diagnosis present

## 2022-01-23 DIAGNOSIS — R9401 Abnormal electroencephalogram [EEG]: Secondary | ICD-10-CM | POA: Diagnosis present

## 2022-01-23 DIAGNOSIS — F0781 Postconcussional syndrome: Secondary | ICD-10-CM | POA: Insufficient documentation

## 2022-01-23 DIAGNOSIS — R569 Unspecified convulsions: Secondary | ICD-10-CM | POA: Insufficient documentation

## 2022-01-23 DIAGNOSIS — R42 Dizziness and giddiness: Secondary | ICD-10-CM | POA: Insufficient documentation

## 2022-01-23 DIAGNOSIS — H814 Vertigo of central origin: Secondary | ICD-10-CM | POA: Insufficient documentation

## 2022-01-23 DIAGNOSIS — R519 Headache, unspecified: Secondary | ICD-10-CM | POA: Insufficient documentation

## 2022-01-23 DIAGNOSIS — G8929 Other chronic pain: Secondary | ICD-10-CM | POA: Diagnosis present

## 2022-01-23 MED ORDER — GADOBUTROL 1 MMOL/ML IV SOLN
6.0000 mL | Freq: Once | INTRAVENOUS | Status: AC | PRN
  Administered 2022-01-23: 6 mL via INTRAVENOUS

## 2022-01-23 NOTE — Telephone Encounter (Signed)
Pt's nerve block cx due to MD emergency, LVM and sent mychart msg.

## 2022-01-26 ENCOUNTER — Encounter: Payer: Self-pay | Admitting: Neurology

## 2022-01-26 NOTE — Telephone Encounter (Signed)
Botox for migraine is an option need to try topiramate, amitriptyline/nortriptyline and/or propranolol - start with topiramate try it and let me know and we can try all these medications prior to trying to get botox for migraine approved

## 2022-01-30 MED ORDER — NORTRIPTYLINE HCL 25 MG PO CAPS: 25.0000 mg | ORAL_CAPSULE | Freq: Every day | ORAL | 3 refills | Status: DC

## 2022-01-31 ENCOUNTER — Ambulatory Visit: Payer: Medicaid Other | Admitting: Neurology

## 2022-02-23 ENCOUNTER — Telehealth (INDEPENDENT_AMBULATORY_CARE_PROVIDER_SITE_OTHER): Payer: Medicaid Other | Admitting: Neurology

## 2022-02-23 ENCOUNTER — Telehealth: Payer: Self-pay | Admitting: Neurology

## 2022-02-23 ENCOUNTER — Other Ambulatory Visit (HOSPITAL_COMMUNITY): Payer: Self-pay

## 2022-02-23 DIAGNOSIS — G8929 Other chronic pain: Secondary | ICD-10-CM

## 2022-02-23 DIAGNOSIS — G43709 Chronic migraine without aura, not intractable, without status migrainosus: Secondary | ICD-10-CM

## 2022-02-23 DIAGNOSIS — S069XAD Unspecified intracranial injury with loss of consciousness status unknown, subsequent encounter: Secondary | ICD-10-CM | POA: Diagnosis not present

## 2022-02-23 DIAGNOSIS — H832X9 Labyrinthine dysfunction, unspecified ear: Secondary | ICD-10-CM

## 2022-02-23 DIAGNOSIS — H814 Vertigo of central origin: Secondary | ICD-10-CM

## 2022-02-23 DIAGNOSIS — R9401 Abnormal electroencephalogram [EEG]: Secondary | ICD-10-CM | POA: Diagnosis not present

## 2022-02-23 DIAGNOSIS — R519 Headache, unspecified: Secondary | ICD-10-CM

## 2022-02-23 DIAGNOSIS — F0781 Postconcussional syndrome: Secondary | ICD-10-CM | POA: Diagnosis not present

## 2022-02-23 DIAGNOSIS — R404 Transient alteration of awareness: Secondary | ICD-10-CM

## 2022-02-23 DIAGNOSIS — M5481 Occipital neuralgia: Secondary | ICD-10-CM

## 2022-02-23 DIAGNOSIS — R4189 Other symptoms and signs involving cognitive functions and awareness: Secondary | ICD-10-CM

## 2022-02-23 MED ORDER — BOTOX 200 UNITS IJ SOLR
INTRAMUSCULAR | 1 refills | Status: DC
  Filled 2022-02-23: qty 1, fill #0
  Filled 2022-02-24: qty 1, 84d supply, fill #0
  Filled 2022-05-16 – 2022-05-18 (×2): qty 1, 84d supply, fill #1

## 2022-02-23 NOTE — Telephone Encounter (Signed)
Pt scheduled with Dr. Jaynee Eagles for botox on 11/7 at 1:00pm

## 2022-02-23 NOTE — Telephone Encounter (Signed)
Chronic migraine  headaches: at leat 10 moderate to severe migraine days a month affecting life, >15 headache days a  month, not aura, no medication overuse, ongling 6 months, pulsating/pounding throbbing, can last 24 hours - 48 hours, photophobia, phonophobia, nausea, dry heaving, hurts to move, Botox for migraine is an option need to tired topiramate(side effects), tried nortriptyline(side effects), propranolol contraindicated due to hypotension bc already on atenolol  G43.709 start botox protocol for chronic migraines   PLEASE CALL AND SCHEDULE can see NP or me.
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
## 2022-02-23 NOTE — Addendum Note (Signed)
Addended by: Gildardo Griffes on: 02/23/2022 12:03 PM   Modules accepted: Orders

## 2022-02-23 NOTE — Telephone Encounter (Signed)
Chronic Migraine CPT 64615  Botox [id number] Units:200  G43.709 Chronic Migraine without aura, not intractable, without status migrainous  New start Botox auth with Dr Jaynee Eagles please

## 2022-02-23 NOTE — Telephone Encounter (Addendum)
Patient Advocate Encounter  Prior Authorization for Botox 200UNIT solution has been approved.    PA# [id number] Key: [id number] Payment Effective dates: 02/23/2022 through 08/22/2022  Can Be Filled at [place], [place] Patient Advocate Specialist [place] Patient Advocate Team Direct Number: [phone]  Fax: [phone]
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
## 2022-02-24 ENCOUNTER — Other Ambulatory Visit (HOSPITAL_COMMUNITY): Payer: Self-pay

## 2022-03-01 NOTE — Telephone Encounter (Signed)
Do we need to set up delivery?

## 2022-03-02 ENCOUNTER — Other Ambulatory Visit (HOSPITAL_COMMUNITY): Payer: Self-pay

## 2022-03-07 ENCOUNTER — Encounter: Payer: Self-pay | Admitting: Neurology

## 2022-03-07 ENCOUNTER — Ambulatory Visit: Payer: Medicaid Other | Admitting: Neurology

## 2022-03-07 DIAGNOSIS — G43709 Chronic migraine without aura, not intractable, without status migrainosus: Secondary | ICD-10-CM

## 2022-03-07 MED ORDER — ONABOTULINUMTOXINA 200 UNITS IJ SOLR
155.0000 [IU] | Freq: Once | INTRAMUSCULAR | Status: AC
  Administered 2022-03-07: 155 [IU] via INTRAMUSCULAR

## 2022-03-07 NOTE — Progress Notes (Signed)
Consent Form Botulism Toxin Injection For Chronic Migraine  03/07/2022: first botox:   Reviewed orally with patient, additionally signature is on file:  Botulism toxin has been approved by the Federal drug administration for treatment of chronic migraine. Botulism toxin does not cure chronic migraine and it may not be effective in some patients.  The administration of botulism toxin is accomplished by injecting a small amount of toxin into the muscles of the neck and head. Dosage must be titrated for each individual. Any benefits resulting from botulism toxin tend to wear off after 3 months with a repeat injection required if benefit is to be maintained. Injections are usually done every 3-4 months with maximum effect peak achieved by about 2 or 3 weeks. Botulism toxin is expensive and you should be sure of what costs you will incur resulting from the injection.  The side effects of botulism toxin use for chronic migraine may include:   -Transient, and usually mild, facial weakness with facial injections  -Transient, and usually mild, head or neck weakness with head/neck injections  -Reduction or loss of forehead facial animation due to forehead muscle weakness  -Eyelid drooping  -Dry eye  -Pain at the site of injection or bruising at the site of injection  -Double vision  -Potential unknown long term risks  Contraindications: You should not have Botox if you are pregnant, nursing, allergic to albumin, have an infection, skin condition, or muscle weakness at the site of the injection, or have myasthenia gravis, Lambert-Eaton syndrome, or ALS.  It is also possible that as with any injection, there may be an allergic reaction or no effect from the medication. Reduced effectiveness after repeated injections is sometimes seen and rarely infection at the injection site may occur. All care will be taken to prevent these side effects. If therapy is given over a long time, atrophy and wasting in the  muscle injected may occur. Occasionally the patient's become refractory to treatment because they develop antibodies to the toxin. In this event, therapy needs to be modified.  I have read the above information and consent to the administration of botulism toxin.    BOTOX PROCEDURE NOTE FOR MIGRAINE HEADACHE    Contraindications and precautions discussed with patient(above). Aseptic procedure was observed and patient tolerated procedure. Procedure performed by Dr. Georgia Dom  The condition has existed for more than 6 months, and pt does not have a diagnosis of ALS, Myasthenia Gravis or Lambert-Eaton Syndrome.  Risks and benefits of injections discussed and pt agrees to proceed with the procedure.  Written consent obtained  These injections are medically necessary. Pt  receives good benefits from these injections. These injections do not cause sedations or hallucinations which the oral therapies may cause.  Description of procedure:  The patient was placed in a sitting position. The standard protocol was used for Botox as follows, with 5 units of Botox injected at each site:   -Procerus muscle, midline injection  -Corrugator muscle, bilateral injection  -Frontalis muscle, bilateral injection, with 2 sites each side, medial injection was performed in the upper one third of the frontalis muscle, in the region vertical from the medial inferior edge of the superior orbital rim. The lateral injection was again in the upper one third of the forehead vertically above the lateral limbus of the cornea, 1.5 cm lateral to the medial injection site.  -Temporalis muscle injection, 4 sites, bilaterally. The first injection was 3 cm above the tragus of the ear, second injection site was 1.5  cm to 3 cm up from the first injection site in line with the tragus of the ear. The third injection site was 1.5-3 cm forward between the first 2 injection sites. The fourth injection site was 1.5 cm posterior to the  second injection site.   -Occipitalis muscle injection, 3 sites, bilaterally. The first injection was done one half way between the occipital protuberance and the tip of the mastoid process behind the ear. The second injection site was done lateral and superior to the first, 1 fingerbreadth from the first injection. The third injection site was 1 fingerbreadth superiorly and medially from the first injection site.  -Cervical paraspinal muscle injection, 2 sites, bilateral knee first injection site was 1 cm from the midline of the cervical spine, 3 cm inferior to the lower border of the occipital protuberance. The second injection site was 1.5 cm superiorly and laterally to the first injection site.  -Trapezius muscle injection was performed at 3 sites, bilaterally. The first injection site was in the upper trapezius muscle halfway between the inflection point of the neck, and the acromion. The second injection site was one half way between the acromion and the first injection site. The third injection was done between the first injection site and the inflection point of the neck.   Will return for repeat injection in 3 months.   200 units of Botox was used, any Botox not injected was wasted. The patient tolerated the procedure well, there were no complications of the above procedure.

## 2022-03-07 NOTE — Progress Notes (Signed)
Botox- 200 units x 1 vial Lot: [id number] Expiration: 06/2024 NDC: 0023-3921-02  Bacteriostatic 0.9% Sodium Chloride- 4mL total Lot: [id number] Expiration: 12/31/2022 NDC: 0409-1966-02  Dx: G43.709 S/P 

## 2022-05-16 ENCOUNTER — Other Ambulatory Visit (HOSPITAL_COMMUNITY): Payer: Self-pay

## 2022-05-17 ENCOUNTER — Other Ambulatory Visit (HOSPITAL_COMMUNITY): Payer: Self-pay

## 2022-05-18 ENCOUNTER — Other Ambulatory Visit (HOSPITAL_COMMUNITY): Payer: Self-pay

## 2022-05-18 ENCOUNTER — Other Ambulatory Visit: Payer: Self-pay

## 2022-05-19 ENCOUNTER — Other Ambulatory Visit (HOSPITAL_COMMUNITY): Payer: Self-pay

## 2022-05-23 ENCOUNTER — Other Ambulatory Visit: Payer: Self-pay

## 2022-05-24 ENCOUNTER — Other Ambulatory Visit (HOSPITAL_COMMUNITY): Payer: Self-pay

## 2022-05-30 ENCOUNTER — Other Ambulatory Visit (HOSPITAL_COMMUNITY): Payer: Self-pay

## 2022-05-30 ENCOUNTER — Ambulatory Visit: Payer: Medicaid Other | Admitting: Neurology

## 2022-05-30 DIAGNOSIS — G43709 Chronic migraine without aura, not intractable, without status migrainosus: Secondary | ICD-10-CM

## 2022-05-30 MED ORDER — ONABOTULINUMTOXINA 200 UNITS IJ SOLR
155.0000 [IU] | Freq: Once | INTRAMUSCULAR | Status: AC
  Administered 2022-05-30: 155 [IU] via INTRAMUSCULAR

## 2022-05-30 NOTE — Progress Notes (Signed)
Botox- 200 units x 1 vial Lot: [id number] Expiration: 06/2024 NDC: 9935-7017-79  Bacteriostatic 0.9% Sodium Chloride- 43mL total Lot: [id number] Expiration: 11/25 NDC: 23300-762-26  Dx: J33.545 S/P

## 2022-05-30 NOTE — Progress Notes (Signed)
Consent Form Botulism Toxin Injection For Chronic Migraine  05/30/2022: she did great,  03/07/2022: first botox:   Reviewed orally with patient, additionally signature is on file:  Botulism toxin has been approved by the Federal drug administration for treatment of chronic migraine. Botulism toxin does not cure chronic migraine and it may not be effective in some patients.  The administration of botulism toxin is accomplished by injecting a small amount of toxin into the muscles of the neck and head. Dosage must be titrated for each individual. Any benefits resulting from botulism toxin tend to wear off after 3 months with a repeat injection required if benefit is to be maintained. Injections are usually done every 3-4 months with maximum effect peak achieved by about 2 or 3 weeks. Botulism toxin is expensive and you should be sure of what costs you will incur resulting from the injection.  The side effects of botulism toxin use for chronic migraine may include:   -Transient, and usually mild, facial weakness with facial injections  -Transient, and usually mild, head or neck weakness with head/neck injections  -Reduction or loss of forehead facial animation due to forehead muscle weakness  -Eyelid drooping  -Dry eye  -Pain at the site of injection or bruising at the site of injection  -Double vision  -Potential unknown long term risks  Contraindications: You should not have Botox if you are pregnant, nursing, allergic to albumin, have an infection, skin condition, or muscle weakness at the site of the injection, or have myasthenia gravis, Lambert-Eaton syndrome, or ALS.  It is also possible that as with any injection, there may be an allergic reaction or no effect from the medication. Reduced effectiveness after repeated injections is sometimes seen and rarely infection at the injection site may occur. All care will be taken to prevent these side effects. If therapy is given over a long time,  atrophy and wasting in the muscle injected may occur. Occasionally the patient's become refractory to treatment because they develop antibodies to the toxin. In this event, therapy needs to be modified.  I have read the above information and consent to the administration of botulism toxin.    BOTOX PROCEDURE NOTE FOR MIGRAINE HEADACHE    Contraindications and precautions discussed with patient(above). Aseptic procedure was observed and patient tolerated procedure. Procedure performed by Dr. Georgia Dom  The condition has existed for more than 6 months, and pt does not have a diagnosis of ALS, Myasthenia Gravis or Lambert-Eaton Syndrome.  Risks and benefits of injections discussed and pt agrees to proceed with the procedure.  Written consent obtained  These injections are medically necessary. Pt  receives good benefits from these injections. These injections do not cause sedations or hallucinations which the oral therapies may cause.  Description of procedure:  The patient was placed in a sitting position. The standard protocol was used for Botox as follows, with 5 units of Botox injected at each site:   -Procerus muscle, midline injection  -Corrugator muscle, bilateral injection  -Frontalis muscle, bilateral injection, with 2 sites each side, medial injection was performed in the upper one third of the frontalis muscle, in the region vertical from the medial inferior edge of the superior orbital rim. The lateral injection was again in the upper one third of the forehead vertically above the lateral limbus of the cornea, 1.5 cm lateral to the medial injection site.  -Temporalis muscle injection, 4 sites, bilaterally. The first injection was 3 cm above the tragus of the ear,  second injection site was 1.5 cm to 3 cm up from the first injection site in line with the tragus of the ear. The third injection site was 1.5-3 cm forward between the first 2 injection sites. The fourth injection site  was 1.5 cm posterior to the second injection site.   -Occipitalis muscle injection, 3 sites, bilaterally. The first injection was done one half way between the occipital protuberance and the tip of the mastoid process behind the ear. The second injection site was done lateral and superior to the first, 1 fingerbreadth from the first injection. The third injection site was 1 fingerbreadth superiorly and medially from the first injection site.  -Cervical paraspinal muscle injection, 2 sites, bilateral knee first injection site was 1 cm from the midline of the cervical spine, 3 cm inferior to the lower border of the occipital protuberance. The second injection site was 1.5 cm superiorly and laterally to the first injection site.  -Trapezius muscle injection was performed at 3 sites, bilaterally. The first injection site was in the upper trapezius muscle halfway between the inflection point of the neck, and the acromion. The second injection site was one half way between the acromion and the first injection site. The third injection was done between the first injection site and the inflection point of the neck.   Will return for repeat injection in 3 months.   200 units of Botox was used, 45 U  Botox not injected was wasted. The patient tolerated the procedure well, there were no complications of the above procedure.

## 2022-07-26 ENCOUNTER — Telehealth: Payer: Self-pay | Admitting: Neurology

## 2022-07-26 NOTE — Telephone Encounter (Signed)
New Botox auth needed before 08/22/22 appt.

## 2022-07-28 ENCOUNTER — Other Ambulatory Visit (HOSPITAL_COMMUNITY): Payer: Self-pay

## 2022-07-28 NOTE — Telephone Encounter (Signed)
Since this will be the PT third [facility] is asking for clarification that an assessment has been noted after the first two injections-I do not see documentation in chart and also has the average number of HA decreased by 6 or more days from baseline-I do not see documentation on that either. Will not submit PA yet until we have clarification so the PA will have a better chance of not getting denied.

## 2022-07-31 NOTE — Telephone Encounter (Signed)
I spoke with the patient.  She reports only 3 migraine days per month now, which is a decrease by at least 7 headache days per month from baseline.  She states she currently cannot remember the last time she had one. They are coming at the end of the cycle when her next round of injections is due.

## 2022-08-02 ENCOUNTER — Other Ambulatory Visit: Payer: Self-pay

## 2022-08-02 ENCOUNTER — Other Ambulatory Visit (HOSPITAL_COMMUNITY): Payer: Self-pay

## 2022-08-02 MED ORDER — BOTOX 200 UNITS IJ SOLR
INTRAMUSCULAR | 3 refills | Status: DC
  Filled 2022-08-02: qty 1, fill #0
  Filled 2022-08-14: qty 1, 84d supply, fill #0
  Filled 2022-11-01 – 2022-11-21 (×2): qty 1, 84d supply, fill #1

## 2022-08-02 NOTE — Addendum Note (Signed)
Addended by: Gildardo Griffes on: 08/02/2022 11:23 AM   Modules accepted: Orders

## 2022-08-02 NOTE — Telephone Encounter (Signed)
Pharmacy Patient Advocate Encounter- Botox BIV-Pharmacy Benefit:  PA was submitted to [facility] [employer] and has been approved through: 08/02/2023 Authorization# PA Case ID: [id number]  Please send prescription to Specialty Pharmacy: [facility]: [phone]  Estimated Copay is: $4.00  Patient Is Not eligible for Botox Copay Card, which will make patient's copay as little as zero. Copay card will be provided to pharmacy.

## 2022-08-02 NOTE — Telephone Encounter (Signed)
Appt note updated and Rx sent to [facility].

## 2022-08-11 ENCOUNTER — Other Ambulatory Visit (HOSPITAL_COMMUNITY): Payer: Self-pay

## 2022-08-11 ENCOUNTER — Other Ambulatory Visit: Payer: Self-pay

## 2022-08-14 ENCOUNTER — Other Ambulatory Visit: Payer: Self-pay

## 2022-08-22 ENCOUNTER — Ambulatory Visit: Payer: Medicaid Other | Admitting: Neurology

## 2022-08-22 DIAGNOSIS — G43709 Chronic migraine without aura, not intractable, without status migrainosus: Secondary | ICD-10-CM | POA: Diagnosis not present

## 2022-08-22 MED ORDER — NURTEC 75 MG PO TBDP: 75.0000 mg | ORAL_TABLET | Freq: Every day | ORAL | 0 refills | Status: DC | PRN

## 2022-08-22 MED ORDER — ONABOTULINUMTOXINA 200 UNITS IJ SOLR
155.0000 [IU] | Freq: Once | INTRAMUSCULAR | Status: AC
  Administered 2022-08-22: 155 [IU] via INTRAMUSCULAR

## 2022-08-22 NOTE — Patient Instructions (Signed)
Rimegepant Disintegrating Tablets What is this medication? RIMEGEPANT (ri ME je pant) prevents and treats migraines. It works by blocking a substance in the body that causes migraines. This medicine may be used for other purposes; ask your health care provider or pharmacist if you have questions. COMMON BRAND NAME(S): NURTEC ODT What should I tell my care team before I take this medication? They need to know if you have any of these conditions: Kidney disease Liver disease An unusual or allergic reaction to rimegepant, other medications, foods, dyes, or preservatives Pregnant or trying to get pregnant Breast-feeding How should I use this medication? Take this medication by mouth. Take it as directed on the prescription label. Leave the tablet in the sealed pack until you are ready to take it. With dry hands, open the pack and gently remove the tablet. If the tablet breaks or crumbles, throw it away. Use a new tablet. Place the tablet in the mouth and allow it to dissolve. Then, swallow it. Do not cut, crush, or chew this medication. You do not need water to take this medication. Talk to your care team about the use of this medication in children. Special care may be needed. Overdosage: If you think you have taken too much of this medicine contact a poison control center or emergency room at once. NOTE: This medicine is only for you. Do not share this medicine with others. What if I miss a dose? This does not apply. This medication is not for regular use. What may interact with this medication? Certain medications for fungal infections, such as fluconazole, itraconazole Rifampin This list may not describe all possible interactions. Give your health care provider a list of all the medicines, herbs, non-prescription drugs, or dietary supplements you use. Also tell them if you smoke, drink alcohol, or use illegal drugs. Some items may interact with your medicine. What should I watch for while  using this medication? Visit your care team for regular checks on your progress. Tell your care team if your symptoms do not start to get better or if they get worse. What side effects may I notice from receiving this medication? Side effects that you should report to your care team as soon as possible: Allergic reactions--skin rash, itching, hives, swelling of the face, lips, tongue, or throat Side effects that usually do not require medical attention (report to your care team if they continue or are bothersome): Nausea Stomach pain This list may not describe all possible side effects. Call your doctor for medical advice about side effects. You may report side effects to FDA at 1-800-FDA-1088. Where should I keep my medication? Keep out of the reach of children and pets. Store at room temperature between 20 and 25 degrees C (68 and 77 degrees F). Get rid of any unused medication after the expiration date. To get rid of medications that are no longer needed or have expired: Take the medication to a medication take-back program. Check with your pharmacy or law enforcement to find a location. If you cannot return the medication, check the label or package insert to see if the medication should be thrown out in the garbage or flushed down the toilet. If you are not sure, ask your care team. If it is safe to put it in the trash, take the medication out of the container. Mix the medication with cat litter, dirt, coffee grounds, or other unwanted substance. Seal the mixture in a bag or container. Put it in the trash. NOTE: This   sheet is a summary. It may not cover all possible information. If you have questions about this medicine, talk to your doctor, pharmacist, or health care provider.  2023 Elsevier/Gold Standard (2021-06-08 00:00:00)  

## 2022-08-22 NOTE — Progress Notes (Signed)
Botox- 200 units x 1 vial Lot: [id number] Expiration: 09/2024 NDC: 4540-9811-91  Bacteriostatic 0.9% Sodium Chloride- 4 mL total Lot: [id number] Expiration: 11/25 NDC: 21308-657-84  Dx: O96.295 S/P  Witnessed by April J RN

## 2022-08-22 NOTE — Progress Notes (Signed)
Consent Form Botulism Toxin Injection For Chronic Migraine  08/22/2022: Put forehead injection higher up to avoid brow droop. >70% improvement in migraine freq and severity. Need to discuss acute management gave her some nurtec samples she had a migraine today. May need to try triptans if she has not.  05/30/2022: she did great,  03/07/2022: first botox:   Reviewed orally with patient, additionally signature is on file:  Botulism toxin has been approved by the Federal drug administration for treatment of chronic migraine. Botulism toxin does not cure chronic migraine and it may not be effective in some patients.  The administration of botulism toxin is accomplished by injecting a small amount of toxin into the muscles of the neck and head. Dosage must be titrated for each individual. Any benefits resulting from botulism toxin tend to wear off after 3 months with a repeat injection required if benefit is to be maintained. Injections are usually done every 3-4 months with maximum effect peak achieved by about 2 or 3 weeks. Botulism toxin is expensive and you should be sure of what costs you will incur resulting from the injection.  The side effects of botulism toxin use for chronic migraine may include:   -Transient, and usually mild, facial weakness with facial injections  -Transient, and usually mild, head or neck weakness with head/neck injections  -Reduction or loss of forehead facial animation due to forehead muscle weakness  -Eyelid drooping  -Dry eye  -Pain at the site of injection or bruising at the site of injection  -Double vision  -Potential unknown long term risks  Contraindications: You should not have Botox if you are pregnant, nursing, allergic to albumin, have an infection, skin condition, or muscle weakness at the site of the injection, or have myasthenia gravis, Lambert-Eaton syndrome, or ALS.  It is also possible that as with any injection, there may be an allergic reaction  or no effect from the medication. Reduced effectiveness after repeated injections is sometimes seen and rarely infection at the injection site may occur. All care will be taken to prevent these side effects. If therapy is given over a long time, atrophy and wasting in the muscle injected may occur. Occasionally the patient's become refractory to treatment because they develop antibodies to the toxin. In this event, therapy needs to be modified.  I have read the above information and consent to the administration of botulism toxin.    BOTOX PROCEDURE NOTE FOR MIGRAINE HEADACHE    Contraindications and precautions discussed with patient(above). Aseptic procedure was observed and patient tolerated procedure. Procedure performed by Dr. Artemio Aly  The condition has existed for more than 6 months, and pt does not have a diagnosis of ALS, Myasthenia Gravis or Lambert-Eaton Syndrome.  Risks and benefits of injections discussed and pt agrees to proceed with the procedure.  Written consent obtained  These injections are medically necessary. Pt  receives good benefits from these injections. These injections do not cause sedations or hallucinations which the oral therapies may cause.  Description of procedure:  The patient was placed in a sitting position. The standard protocol was used for Botox as follows, with 5 units of Botox injected at each site:   -Procerus muscle, midline injection  -Corrugator muscle, bilateral injection  -Frontalis muscle, bilateral injection, with 2 sites each side, medial injection was performed in the upper one third of the frontalis muscle, in the region vertical from the medial inferior edge of the superior orbital rim. The lateral injection was again in  the upper one third of the forehead vertically above the lateral limbus of the cornea, 1.5 cm lateral to the medial injection site.  -Temporalis muscle injection, 4 sites, bilaterally. The first injection was 3 cm  above the tragus of the ear, second injection site was 1.5 cm to 3 cm up from the first injection site in line with the tragus of the ear. The third injection site was 1.5-3 cm forward between the first 2 injection sites. The fourth injection site was 1.5 cm posterior to the second injection site.   -Occipitalis muscle injection, 3 sites, bilaterally. The first injection was done one half way between the occipital protuberance and the tip of the mastoid process behind the ear. The second injection site was done lateral and superior to the first, 1 fingerbreadth from the first injection. The third injection site was 1 fingerbreadth superiorly and medially from the first injection site.  -Cervical paraspinal muscle injection, 2 sites, bilateral knee first injection site was 1 cm from the midline of the cervical spine, 3 cm inferior to the lower border of the occipital protuberance. The second injection site was 1.5 cm superiorly and laterally to the first injection site.  -Trapezius muscle injection was performed at 3 sites, bilaterally. The first injection site was in the upper trapezius muscle halfway between the inflection point of the neck, and the acromion. The second injection site was one half way between the acromion and the first injection site. The third injection was done between the first injection site and the inflection point of the neck.   Will return for repeat injection in 3 months.   200 units of Botox was used, 45 U  Botox not injected was wasted. The patient tolerated the procedure well, there were no complications of the above procedure.

## 2022-09-18 ENCOUNTER — Telehealth: Payer: Medicaid Other | Admitting: Neurology

## 2022-11-01 ENCOUNTER — Other Ambulatory Visit (HOSPITAL_COMMUNITY): Payer: Self-pay

## 2022-11-06 ENCOUNTER — Other Ambulatory Visit (HOSPITAL_COMMUNITY): Payer: Self-pay

## 2022-11-07 ENCOUNTER — Other Ambulatory Visit (HOSPITAL_COMMUNITY): Payer: Self-pay

## 2022-11-08 ENCOUNTER — Other Ambulatory Visit (HOSPITAL_COMMUNITY): Payer: Self-pay

## 2022-11-09 ENCOUNTER — Other Ambulatory Visit (HOSPITAL_COMMUNITY): Payer: Self-pay

## 2022-11-10 ENCOUNTER — Other Ambulatory Visit (HOSPITAL_COMMUNITY): Payer: Self-pay

## 2022-11-13 ENCOUNTER — Other Ambulatory Visit (HOSPITAL_COMMUNITY): Payer: Self-pay

## 2022-11-13 ENCOUNTER — Telehealth: Payer: Self-pay | Admitting: Adult Health

## 2022-11-13 NOTE — Telephone Encounter (Signed)
Patient called in upset about appointment being cancelled. She was confused as to why she needed authorization through [facility] when she was already authorized through [facility] and that she had gotten confirmation from her pharmacy that her Botox was being delivered two weeks ago. I checked the Botox fridge with Toma Copier RN and did not see anything for this patient.   I let her know that since the [employer] was added to her chart, that would be her primary insurance and that the medicaid would be secondary, which is why we would need the new authorization. She was still questioning this process. I let her know that I could have the Botox coordinator give her a call in the morning.  Please reach out to the patient to discuss this. Thanks!

## 2022-11-13 NOTE — Telephone Encounter (Signed)
Unable to reach pt by phone, sent multiple mychart messages and texts cancelling tomorrow's appt- Drucie Opitz still pending.

## 2022-11-14 ENCOUNTER — Ambulatory Visit: Payer: Medicaid Other | Admitting: Adult Health

## 2022-11-14 ENCOUNTER — Ambulatory Visit: Payer: Medicaid Other | Admitting: Neurology

## 2022-11-14 NOTE — Telephone Encounter (Signed)
I returned pt's call and explained to her that we can't bring her in for an appointment without the approval from [facility]. She did raise some concerns about missing her dose and already starting to be in pain. I informed her that as soon as we received approval we'd do our best to get her Botox here and get her in as soon as possible.

## 2022-11-15 ENCOUNTER — Other Ambulatory Visit (HOSPITAL_COMMUNITY): Payer: Self-pay

## 2022-11-16 ENCOUNTER — Other Ambulatory Visit (HOSPITAL_COMMUNITY): Payer: Self-pay

## 2022-11-17 ENCOUNTER — Other Ambulatory Visit (HOSPITAL_COMMUNITY): Payer: Self-pay

## 2022-11-21 ENCOUNTER — Other Ambulatory Visit (HOSPITAL_COMMUNITY): Payer: Self-pay

## 2022-11-21 NOTE — Telephone Encounter (Signed)
Pt checking on if have [facility] has received the approval for Botox. Would like a call back.

## 2022-11-21 NOTE — Telephone Encounter (Signed)
Called pt back and rescheduled appt for 7/24 at 12:45 pm.

## 2022-11-21 NOTE — Telephone Encounter (Signed)
Received approval from [employer]: [id number] (11/08/22-11/08/23). I am checking with delivery dates available with [facility], will reach out to r/s pt after.

## 2022-11-22 ENCOUNTER — Ambulatory Visit: Payer: 59 | Admitting: Adult Health

## 2022-11-22 ENCOUNTER — Encounter: Payer: Self-pay | Admitting: Adult Health

## 2022-11-22 DIAGNOSIS — G43709 Chronic migraine without aura, not intractable, without status migrainosus: Secondary | ICD-10-CM | POA: Diagnosis not present

## 2022-11-22 MED ORDER — UBRELVY 100 MG PO TABS: 100.0000 mg | ORAL_TABLET | ORAL | 11 refills | Status: DC | PRN

## 2022-11-22 MED ORDER — ONABOTULINUMTOXINA 200 UNITS IJ SOLR
155.0000 [IU] | Freq: Once | INTRAMUSCULAR | Status: AC
  Administered 2022-11-22: 155 [IU] via INTRAMUSCULAR

## 2022-11-22 NOTE — Progress Notes (Signed)
Botox- 200 units x 1 vial Lot: [id number] Expiration: 03/2025 NDC: 2130-8657-84  Bacteriostatic 0.9% Sodium Chloride- * mL  Lot: [id number] Expiration: 12/17/2022 NDC: 2841-3244-01  Dx: U27.253  B/B Witnessed by Elane Fritz

## 2022-11-22 NOTE — Progress Notes (Signed)
11/22/2022 JM: returns for repeat botox.  Reports continued benefit with Botox injection. She was slightly overdue for this injection due to insurance reasons and has noticed some worsening migraines. Use of Nurtec for rescue, decreases severity but does not abort migraine. Will trial Ubrelvy for rescue, samples provided.  Tolerated injections well today. Will return in 3 months for repeat injection.       Consent Form Botulism Toxin Injection For Chronic Migraine    Reviewed orally with patient, additionally signature is on file:  Botulism toxin has been approved by the Federal drug administration for treatment of chronic migraine. Botulism toxin does not cure chronic migraine and it may not be effective in some patients.  The administration of botulism toxin is accomplished by injecting a small amount of toxin into the muscles of the neck and head. Dosage must be titrated for each individual. Any benefits resulting from botulism toxin tend to wear off after 3 months with a repeat injection required if benefit is to be maintained. Injections are usually done every 3-4 months with maximum effect peak achieved by about 2 or 3 weeks. Botulism toxin is expensive and you should be sure of what costs you will incur resulting from the injection.  The side effects of botulism toxin use for chronic migraine may include:   -Transient, and usually mild, facial weakness with facial injections  -Transient, and usually mild, head or neck weakness with head/neck injections  -Reduction or loss of forehead facial animation due to forehead muscle weakness  -Eyelid drooping  -Dry eye  -Pain at the site of injection or bruising at the site of injection  -Double vision  -Potential unknown long term risks   Contraindications: You should not have Botox if you are pregnant, nursing, allergic to albumin, have an infection, skin condition, or muscle weakness at the site of the injection, or have  myasthenia gravis, Lambert-Eaton syndrome, or ALS.  It is also possible that as with any injection, there may be an allergic reaction or no effect from the medication. Reduced effectiveness after repeated injections is sometimes seen and rarely infection at the injection site may occur. All care will be taken to prevent these side effects. If therapy is given over a long time, atrophy and wasting in the muscle injected may occur. Occasionally the patient's become refractory to treatment because they develop antibodies to the toxin. In this event, therapy needs to be modified.  I have read the above information and consent to the administration of botulism toxin.    BOTOX PROCEDURE NOTE FOR MIGRAINE HEADACHE  Contraindications and precautions discussed with patient(above). Aseptic procedure was observed and patient tolerated procedure. Procedure performed by Ihor Austin, AGNP-BC.   The condition has existed for more than 6 months, and pt does not have a diagnosis of ALS, Myasthenia Gravis or Lambert-Eaton Syndrome.  Risks and benefits of injections discussed and pt agrees to proceed with the procedure.  Written consent obtained  These injections are medically necessary. Pt  receives good benefits from these injections. These injections do not cause sedations or hallucinations which the oral therapies may cause.   Description of procedure:  The patient was placed in a sitting position. The standard protocol was used for Botox as follows, with 5 units of Botox injected at each site:  -Procerus muscle, midline injection  -Corrugator muscle, bilateral injection  -Frontalis muscle, bilateral injection, with 2 sites each side, medial injection was performed in the upper one third of the  frontalis muscle, in the region vertical from the medial inferior edge of the superior orbital rim. The lateral injection was again in the upper one third of the forehead vertically above the lateral limbus of the  cornea, 1.5 cm lateral to the medial injection site.  -Temporalis muscle injection, 4 sites, bilaterally. The first injection was 3 cm above the tragus of the ear, second injection site was 1.5 cm to 3 cm up from the first injection site in line with the tragus of the ear. The third injection site was 1.5-3 cm forward between the first 2 injection sites. The fourth injection site was 1.5 cm posterior to the second injection site. 5th site laterally in the temporalis  muscleat the level of the outer canthus.  -Occipitalis muscle injection, 3 sites, bilaterally. The first injection was done one half way between the occipital protuberance and the tip of the mastoid process behind the ear. The second injection site was done lateral and superior to the first, 1 fingerbreadth from the first injection. The third injection site was 1 fingerbreadth superiorly and medially from the first injection site.  -Cervical paraspinal muscle injection, 2 sites, bilaterally. The first injection site was 1 cm from the midline of the cervical spine, 3 cm inferior to the lower border of the occipital protuberance. The second injection site was 1.5 cm superiorly and laterally to the first injection site.  -Trapezius muscle injection was performed at 3 sites, bilaterally. The first injection site was in the upper trapezius muscle halfway between the inflection point of the neck, and the acromion. The second injection site was one half way between the acromion and the first injection site. The third injection was done between the first injection site and the inflection point of the neck.    A total of 200 units of Botox was prepared, 155 units of Botox was injected as documented above, any Botox not injected was wasted. The patient tolerated the procedure well, there were no complications of the above procedure.   Ihor Austin, AGNP-BC  [facility] [address] [place], [place]  [address]  Phone [phone] Fax [phone] Note: This document was prepared with digital dictation and possible smart phrase technology. Any transcriptional errors that result from this process are unintentional.

## 2023-02-15 ENCOUNTER — Ambulatory Visit (INDEPENDENT_AMBULATORY_CARE_PROVIDER_SITE_OTHER): Payer: 59 | Admitting: Adult Health

## 2023-02-15 DIAGNOSIS — G43709 Chronic migraine without aura, not intractable, without status migrainosus: Secondary | ICD-10-CM | POA: Diagnosis not present

## 2023-02-15 MED ORDER — ONABOTULINUMTOXINA 200 UNITS IJ SOLR
155.0000 [IU] | Freq: Once | INTRAMUSCULAR | Status: AC
  Administered 2023-02-15: 125 [IU] via INTRAMUSCULAR

## 2023-02-15 NOTE — Progress Notes (Signed)
Botox- 200 units x 1 vial Lot: [id number] Expiration: 06/2025 NDC: 9147-8295-62  Bacteriostatic 0.9% Sodium Chloride- * mL  Lot: [id number] Expiration: 07/31/2023 NDC: 7846-9629-52  Dx: 43.709 B/B Witnessed by Marlana Latus, RN

## 2023-02-15 NOTE — Progress Notes (Signed)
Patient returns for repeat botox. Prior injection 11/22/2022. Reports first 2 months after injection, migraines well controlled but during the 3rd month, migraines gradually become more persistent and about 2 weeks prior to next injection, having about 3-4 migraines per week. She also can have worsening cognition and difficulty with her speech during this time (chronic from postconcussive syndrome). Has been recently using Bernita Raisin which has been helpful. Discussed considering switching treatment to [facility] but declines interest at this time. She requested to eliminate injections in upper trapezius muscles today.  Tolerated procedure well today. Return in 3 months for repeat injection.          Consent Form Botulism Toxin Injection For Chronic Migraine    Reviewed orally with patient, additionally signature is on file:  Botulism toxin has been approved by the Federal drug administration for treatment of chronic migraine. Botulism toxin does not cure chronic migraine and it may not be effective in some patients.  The administration of botulism toxin is accomplished by injecting a small amount of toxin into the muscles of the neck and head. Dosage must be titrated for each individual. Any benefits resulting from botulism toxin tend to wear off after 3 months with a repeat injection required if benefit is to be maintained. Injections are usually done every 3-4 months with maximum effect peak achieved by about 2 or 3 weeks. Botulism toxin is expensive and you should be sure of what costs you will incur resulting from the injection.  The side effects of botulism toxin use for chronic migraine may include:   -Transient, and usually mild, facial weakness with facial injections  -Transient, and usually mild, head or neck weakness with head/neck injections  -Reduction or loss of forehead facial animation due to forehead muscle weakness  -Eyelid drooping  -Dry eye  -Pain at the site of injection  or bruising at the site of injection  -Double vision  -Potential unknown long term risks   Contraindications: You should not have Botox if you are pregnant, nursing, allergic to albumin, have an infection, skin condition, or muscle weakness at the site of the injection, or have myasthenia gravis, Lambert-Eaton syndrome, or ALS.  It is also possible that as with any injection, there may be an allergic reaction or no effect from the medication. Reduced effectiveness after repeated injections is sometimes seen and rarely infection at the injection site may occur. All care will be taken to prevent these side effects. If therapy is given over a long time, atrophy and wasting in the muscle injected may occur. Occasionally the patient's become refractory to treatment because they develop antibodies to the toxin. In this event, therapy needs to be modified.  I have read the above information and consent to the administration of botulism toxin.    BOTOX PROCEDURE NOTE FOR MIGRAINE HEADACHE  Contraindications and precautions discussed with patient(above). Aseptic procedure was observed and patient tolerated procedure. Procedure performed by Ihor Austin, AGNP-BC.   The condition has existed for more than 6 months, and pt does not have a diagnosis of ALS, Myasthenia Gravis or Lambert-Eaton Syndrome.  Risks and benefits of injections discussed and pt agrees to proceed with the procedure.  Written consent obtained  These injections are medically necessary. Pt  receives good benefits from these injections. These injections do not cause sedations or hallucinations which the oral therapies may cause.   Description of procedure:  The patient was placed in a sitting position. The standard protocol was used for Botox as  follows, with 5 units of Botox injected at each site:  -Procerus muscle, midline injection  -Corrugator muscle, bilateral injection  -Frontalis muscle, bilateral injection, with 2 sites  each side, medial injection was performed in the upper one third of the frontalis muscle, in the region vertical from the medial inferior edge of the superior orbital rim. The lateral injection was again in the upper one third of the forehead vertically above the lateral limbus of the cornea, 1.5 cm lateral to the medial injection site.  -Temporalis muscle injection, 4 sites, bilaterally. The first injection was 3 cm above the tragus of the ear, second injection site was 1.5 cm to 3 cm up from the first injection site in line with the tragus of the ear. The third injection site was 1.5-3 cm forward between the first 2 injection sites. The fourth injection site was 1.5 cm posterior to the second injection site. 5th site laterally in the temporalis  muscleat the level of the outer canthus.  -Occipitalis muscle injection, 3 sites, bilaterally. The first injection was done one half way between the occipital protuberance and the tip of the mastoid process behind the ear. The second injection site was done lateral and superior to the first, 1 fingerbreadth from the first injection. The third injection site was 1 fingerbreadth superiorly and medially from the first injection site.  -Cervical paraspinal muscle injection, 2 sites, bilaterally. The first injection site was 1 cm from the midline of the cervical spine, 3 cm inferior to the lower border of the occipital protuberance. The second injection site was 1.5 cm superiorly and laterally to the first injection site.  -Trapezius muscle declined by patient    A total of 200 units of Botox was prepared, 125 units of Botox was injected as documented above, any Botox not injected was wasted. The patient tolerated the procedure well, there were no complications of the above procedure.   Ihor Austin, AGNP-BC  [facility] [address] [place], [place] [address]  Phone [phone] Fax [phone] Note: This document was  prepared with digital dictation and possible smart phrase technology. Any transcriptional errors that result from this process are unintentional.

## 2023-04-16 ENCOUNTER — Telehealth: Payer: Self-pay | Admitting: Orthopaedic Surgery

## 2023-04-16 NOTE — Telephone Encounter (Signed)
Records re-faxed to [facility] [phone]. Records originally faxed 11/14

## 2023-04-17 ENCOUNTER — Telehealth: Payer: Self-pay | Admitting: Neurology

## 2023-04-17 ENCOUNTER — Telehealth: Payer: 59 | Admitting: Neurology

## 2023-04-17 ENCOUNTER — Encounter: Payer: Self-pay | Admitting: Neurology

## 2023-04-17 DIAGNOSIS — F0781 Postconcussional syndrome: Secondary | ICD-10-CM | POA: Diagnosis not present

## 2023-04-17 DIAGNOSIS — H539 Unspecified visual disturbance: Secondary | ICD-10-CM

## 2023-04-17 DIAGNOSIS — R519 Headache, unspecified: Secondary | ICD-10-CM

## 2023-04-17 DIAGNOSIS — G4719 Other hypersomnia: Secondary | ICD-10-CM

## 2023-04-17 DIAGNOSIS — H499 Unspecified paralytic strabismus: Secondary | ICD-10-CM

## 2023-04-17 DIAGNOSIS — G43711 Chronic migraine without aura, intractable, with status migrainosus: Secondary | ICD-10-CM | POA: Diagnosis not present

## 2023-04-17 DIAGNOSIS — R410 Disorientation, unspecified: Secondary | ICD-10-CM

## 2023-04-17 DIAGNOSIS — G8929 Other chronic pain: Secondary | ICD-10-CM

## 2023-04-17 DIAGNOSIS — R4701 Aphasia: Secondary | ICD-10-CM

## 2023-04-17 DIAGNOSIS — H5712 Ocular pain, left eye: Secondary | ICD-10-CM

## 2023-04-17 DIAGNOSIS — H5500 Unspecified nystagmus: Secondary | ICD-10-CM

## 2023-04-17 DIAGNOSIS — R51 Headache with orthostatic component, not elsewhere classified: Secondary | ICD-10-CM

## 2023-04-17 DIAGNOSIS — S0990XS Unspecified injury of head, sequela: Secondary | ICD-10-CM

## 2023-04-17 MED ORDER — EMGALITY 120 MG/ML ~~LOC~~ SOAJ: 240.0000 mg | Freq: Once | SUBCUTANEOUS | 0 refills | Status: AC

## 2023-04-17 NOTE — Telephone Encounter (Signed)
Can you squeeze her in a botox spot or a work in sometime in mid to mate march for follow up can be video.

## 2023-04-17 NOTE — Patient Instructions (Addendum)
-   Ordered MRI brain and orbits - ask for [facility]/[place] office [phone] - Speech/cognitive therapy at [facility] - we will send you a mychart message with the phone number to call to be proactive - Sleep evaluation: morning headaches "terrible daily morning headaches" and excessive fatigue and hx of concussion:  January 7th be here at 1030 - Start emgality 2 injections first month and 1 injection every subsequent month can do it at home yourself or bring it January and we can inject it here and train you    Orders Placed This Encounter  Procedures   MR BRAIN W WO CONTRAST   MR ORBITS W WO CONTRAST   Ambulatory referral to Speech Therapy   Ambulatory referral to Sleep Studies   Meds ordered this encounter  Medications   Galcanezumab-gnlm (EMGALITY) 120 MG/ML SOAJ    Sig: Inject 240 mg into the skin once for 1 dose. Loading dose. PLEASE USE COPAY CARD: [id number] PCN [id number] GRP [id number] ID [id number] EXP 05/01/2023    Dispense:  2 mL    Refill:  0    Loading dose. PLEASE USE COPAY CARD: [id number] PCN [id number] GRP [id number] ID [id number] EXP 05/01/2023
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
## 2023-04-17 NOTE — Telephone Encounter (Signed)
Pattricia Boss, I have placed a referral for speech/cognitivetherapy for patient at [facility](she lives in [place]) when you send referral can you mychart her with the numbner so she can call and schedule so she doesn't have to wait for them to call.

## 2023-04-17 NOTE — Telephone Encounter (Signed)
LVM asking pt to call back and schedule f/u in mid-March. Can use work-in spot per Dr. Lucia Gaskins.

## 2023-04-19 ENCOUNTER — Telehealth: Payer: Self-pay | Admitting: Neurology

## 2023-04-19 NOTE — Telephone Encounter (Signed)
sent to GI to schedule at [facility], they obtain Drucie Opitz, healthy blue Berkley Harvey: [id number] exp. 04/19/23-06/17/23  [phone]

## 2023-04-24 ENCOUNTER — Telehealth: Payer: Self-pay | Admitting: Neurology

## 2023-04-24 NOTE — Telephone Encounter (Signed)
Referral for Speech Therapy fax to [facility]. Phone: [phone], Fax: [phone]

## 2023-04-24 NOTE — Telephone Encounter (Signed)
Referral for speech therapy fax to [facility]. Phone: [phone], Fax: [phone]

## 2023-05-08 ENCOUNTER — Institutional Professional Consult (permissible substitution): Payer: 59 | Admitting: Neurology

## 2023-05-08 ENCOUNTER — Telehealth: Payer: Self-pay | Admitting: Neurology
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
## 2023-05-14 ENCOUNTER — Telehealth: Payer: Self-pay | Admitting: Neurology

## 2023-05-14 ENCOUNTER — Ambulatory Visit: Payer: 59 | Admitting: Adult Health

## 2023-05-14 NOTE — Telephone Encounter (Signed)
 Pt has called back to be r/s for her Botox appointment.

## 2023-05-14 NOTE — Telephone Encounter (Signed)
 Pt called asking about r/s botox for next available.  Pt was sent to billing to address no show fee before appointment could be r/s.

## 2023-05-15 ENCOUNTER — Other Ambulatory Visit: Payer: Self-pay | Admitting: Neurology

## 2023-05-15 DIAGNOSIS — G43711 Chronic migraine without aura, intractable, with status migrainosus: Secondary | ICD-10-CM

## 2023-05-15 MED ORDER — EMGALITY 120 MG/ML ~~LOC~~ SOAJ: 120.0000 mg | SUBCUTANEOUS | 11 refills | Status: DC

## 2023-05-18 ENCOUNTER — Ambulatory Visit (INDEPENDENT_AMBULATORY_CARE_PROVIDER_SITE_OTHER): Payer: 59 | Admitting: Neurology

## 2023-05-18 VITALS — BP 142/54 | HR 62 | Ht 61.0 in | Wt 138.0 lb

## 2023-05-18 DIAGNOSIS — R519 Headache, unspecified: Secondary | ICD-10-CM | POA: Insufficient documentation

## 2023-05-18 DIAGNOSIS — G44019 Episodic cluster headache, not intractable: Secondary | ICD-10-CM | POA: Diagnosis not present

## 2023-05-18 DIAGNOSIS — I447 Left bundle-branch block, unspecified: Secondary | ICD-10-CM

## 2023-05-18 DIAGNOSIS — S0990XA Unspecified injury of head, initial encounter: Secondary | ICD-10-CM

## 2023-05-18 DIAGNOSIS — F0781 Postconcussional syndrome: Secondary | ICD-10-CM
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
## 2023-05-21 ENCOUNTER — Ambulatory Visit (INDEPENDENT_AMBULATORY_CARE_PROVIDER_SITE_OTHER): Payer: 59 | Admitting: Adult Health

## 2023-05-21 VITALS — BP 140/78 | HR 59

## 2023-05-21 DIAGNOSIS — G43711 Chronic migraine without aura, intractable, with status migrainosus: Secondary | ICD-10-CM | POA: Diagnosis not present

## 2023-05-21 MED ORDER — ONABOTULINUMTOXINA 200 UNITS IJ SOLR
155.0000 [IU] | Freq: Once | INTRAMUSCULAR | Status: AC
  Administered 2023-05-21: 125 [IU] via INTRAMUSCULAR

## 2023-05-21 NOTE — Progress Notes (Signed)
Botox- 200 units x 1 vial [id number] Expiration: 06/2025 NDC: 1610-9604-54  Bacteriostatic 0.9% Sodium Chloride- 4mL total [id number] Expiration: 03/02/24 NDC: 8295-6213-08  Dx: G43.709  BB  Witnessed by: Clemencia Course

## 2023-05-21 NOTE — Progress Notes (Signed)
Update 05/21/2023 JM: Patient returns for repeat botox. Prior injection 02/15/2023.  She was seen by Dr. Lucia Gaskins 12/17 and started on Emgality in addition to Botox with reported 8 migraines per month and 15 headache days.  Since the last Botox, she reports having about 2 migraines per week.  Never started [facility], she wants to wait until after today's repeat Botox and if migraines persist over the next 1 to 2 weeks, she plans on starting Emgality.  Use of Ubrelvy with benefit.  Declines upper trapezius muscle injections.  Return in 3 months for repeat injection.          Consent Form Botulism Toxin Injection For Chronic Migraine    Reviewed orally with patient, additionally signature is on file:  Botulism toxin has been approved by the Federal drug administration for treatment of chronic migraine. Botulism toxin does not cure chronic migraine and it may not be effective in some patients.  The administration of botulism toxin is accomplished by injecting a small amount of toxin into the muscles of the neck and head. Dosage must be titrated for each individual. Any benefits resulting from botulism toxin tend to wear off after 3 months with a repeat injection required if benefit is to be maintained. Injections are usually done every 3-4 months with maximum effect peak achieved by about 2 or 3 weeks. Botulism toxin is expensive and you should be sure of what costs you will incur resulting from the injection.  The side effects of botulism toxin use for chronic migraine may include:   -Transient, and usually mild, facial weakness with facial injections  -Transient, and usually mild, head or neck weakness with head/neck injections  -Reduction or loss of forehead facial animation due to forehead muscle weakness  -Eyelid drooping  -Dry eye  -Pain at the site of injection or bruising at the site of injection  -Double vision  -Potential unknown long term risks   Contraindications: You  should not have Botox if you are pregnant, nursing, allergic to albumin, have an infection, skin condition, or muscle weakness at the site of the injection, or have myasthenia gravis, Lambert-Eaton syndrome, or ALS.  It is also possible that as with any injection, there may be an allergic reaction or no effect from the medication. Reduced effectiveness after repeated injections is sometimes seen and rarely infection at the injection site may occur. All care will be taken to prevent these side effects. If therapy is given over a long time, atrophy and wasting in the muscle injected may occur. Occasionally the patient's become refractory to treatment because they develop antibodies to the toxin. In this event, therapy needs to be modified.  I have read the above information and consent to the administration of botulism toxin.    BOTOX PROCEDURE NOTE FOR MIGRAINE HEADACHE  Contraindications and precautions discussed with patient(above). Aseptic procedure was observed and patient tolerated procedure. Procedure performed by Ihor Austin, AGNP-BC.   The condition has existed for more than 6 months, and pt does not have a diagnosis of ALS, Myasthenia Gravis or Lambert-Eaton Syndrome.  Risks and benefits of injections discussed and pt agrees to proceed with the procedure.  Written consent obtained  These injections are medically necessary. Pt  receives good benefits from these injections. These injections do not cause sedations or hallucinations which the oral therapies may cause.   Description of procedure:  The patient was placed in a sitting position. The standard protocol was used for Botox as follows, with 5  units of Botox injected at each site:  -Procerus muscle, midline injection  -Corrugator muscle, bilateral injection  -Frontalis muscle, bilateral injection, with 2 sites each side, medial injection was performed in the upper one third of the frontalis muscle, in the region vertical from  the medial inferior edge of the superior orbital rim. The lateral injection was again in the upper one third of the forehead vertically above the lateral limbus of the cornea, 1.5 cm lateral to the medial injection site.  -Temporalis muscle injection, 4 sites, bilaterally. The first injection was 3 cm above the tragus of the ear, second injection site was 1.5 cm to 3 cm up from the first injection site in line with the tragus of the ear. The third injection site was 1.5-3 cm forward between the first 2 injection sites. The fourth injection site was 1.5 cm posterior to the second injection site. 5th site laterally in the temporalis  muscleat the level of the outer canthus.  -Occipitalis muscle injection, 3 sites, bilaterally. The first injection was done one half way between the occipital protuberance and the tip of the mastoid process behind the ear. The second injection site was done lateral and superior to the first, 1 fingerbreadth from the first injection. The third injection site was 1 fingerbreadth superiorly and medially from the first injection site.  -Cervical paraspinal muscle injection, 2 sites, bilaterally. The first injection site was 1 cm from the midline of the cervical spine, 3 cm inferior to the lower border of the occipital protuberance. The second injection site was 1.5 cm superiorly and laterally to the first injection site.  -Trapezius muscle declined by patient    A total of 200 units of Botox was prepared, 125 units of Botox was injected as documented above, any Botox not injected was wasted. The patient tolerated the procedure well, there were no complications of the above procedure.   Ihor Austin, AGNP-BC  [facility] [address] [place], [place] [address]  Phone [phone] Fax [phone] Note: This document was prepared with digital dictation and possible smart phrase technology. Any transcriptional errors that result from this  process are unintentional.

## 2023-06-08 ENCOUNTER — Encounter: Payer: Self-pay | Admitting: Neurology

## 2023-06-19 ENCOUNTER — Other Ambulatory Visit: Payer: 59

## 2023-06-19 ENCOUNTER — Inpatient Hospital Stay: Admission: RE | Admit: 2023-06-19 | Payer: 59 | Source: Ambulatory Visit

## 2023-06-26 ENCOUNTER — Ambulatory Visit: Payer: 59 | Admitting: Adult Health

## 2023-07-06 ENCOUNTER — Ambulatory Visit: Admitting: Neurology

## 2023-07-06 DIAGNOSIS — G4733 Obstructive sleep apnea (adult) (pediatric): Secondary | ICD-10-CM

## 2023-07-06 DIAGNOSIS — F0781 Postconcussional syndrome: Secondary | ICD-10-CM

## 2023-07-06 DIAGNOSIS — R519 Headache, unspecified: Secondary | ICD-10-CM

## 2023-07-06 DIAGNOSIS — G44019 Episodic cluster headache, not intractable: Secondary | ICD-10-CM

## 2023-07-10 ENCOUNTER — Encounter

## 2023-07-23 ENCOUNTER — Telehealth: Payer: Self-pay | Admitting: Neurology

## 2023-07-23 NOTE — Telephone Encounter (Signed)
 LVM asking pt if she received new insurance, current [employer] plan is inactive.

## 2023-07-31 NOTE — Telephone Encounter (Signed)
 Sharon Friedman is now verifying. Completed PA form for pt's secondary [employer] and faxed along with OV notes.
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
## 2023-08-05 ENCOUNTER — Other Ambulatory Visit

## 2023-08-08 ENCOUNTER — Telehealth: Payer: Self-pay | Admitting: Physician Assistant

## 2023-08-08 NOTE — Telephone Encounter (Signed)
 Please copy 05/12/2021 xray to CD/ Please let me know when ready. This is atty request. Thank you!

## 2023-08-08 NOTE — Telephone Encounter (Signed)
 CD mailed to Gaspar Skeeters, [facility] [address] [place], [place] [address]. Auth on file

## 2023-08-14 ENCOUNTER — Telehealth: Payer: Self-pay | Admitting: Physician Assistant

## 2023-08-14 NOTE — Telephone Encounter (Signed)
 05/12/2021 xray report faxed to Lawanna Precise, [facility] [phone], attn: Mckinley Spells

## 2023-08-20 ENCOUNTER — Ambulatory Visit (INDEPENDENT_AMBULATORY_CARE_PROVIDER_SITE_OTHER): Payer: 59 | Admitting: Adult Health

## 2023-08-20 VITALS — BP 154/85 | HR 55

## 2023-08-20 DIAGNOSIS — G43711 Chronic migraine without aura, intractable, with status migrainosus: Secondary | ICD-10-CM | POA: Diagnosis not present

## 2023-08-20 MED ORDER — ONABOTULINUMTOXINA 200 UNITS IJ SOLR
155.0000 [IU] | Freq: Once | INTRAMUSCULAR | Status: AC
  Administered 2023-08-20: 125 [IU] via INTRAMUSCULAR

## 2023-08-20 NOTE — Progress Notes (Signed)
 Botox - 200 units x 1 vial [id number] Expiration: 07/2025 NDC: 0023-3921-02   Bacteriostatic 0.9% Sodium Chloride - 4mL total [id number] Expiration: 03/02/24 NDC: 0865-7846-96   Dx: G43.709     BB   Witnessed by: Arville Laughter

## 2023-08-20 NOTE — Progress Notes (Signed)
 Update 08/20/2023 JM: Patient returns for repeat botox . Prior injection 05/21/2023.  Reports migraines currently well-controlled.  Having about 1 mild migraine per week, will use Excedrin if needed with benefit, has not recently needed Ubrelvy .  She never started Emgality  as migraines currently well-controlled. Declines upper trapezius muscle injections.  Return in 3 months for repeat injection.          Consent Form Botulism Toxin Injection For Chronic Migraine    Reviewed orally with patient, additionally signature is on file:  Botulism toxin has been approved by the Federal drug administration for treatment of chronic migraine. Botulism toxin does not cure chronic migraine and it may not be effective in some patients.  The administration of botulism toxin is accomplished by injecting a small amount of toxin into the muscles of the neck and head. Dosage must be titrated for each individual. Any benefits resulting from botulism toxin tend to wear off after 3 months with a repeat injection required if benefit is to be maintained. Injections are usually done every 3-4 months with maximum effect peak achieved by about 2 or 3 weeks. Botulism toxin is expensive and you should be sure of what costs you will incur resulting from the injection.  The side effects of botulism toxin use for chronic migraine may include:   -Transient, and usually mild, facial weakness with facial injections  -Transient, and usually mild, head or neck weakness with head/neck injections  -Reduction or loss of forehead facial animation due to forehead muscle weakness  -Eyelid drooping  -Dry eye  -Pain at the site of injection or bruising at the site of injection  -Double vision  -Potential unknown long term risks   Contraindications: You should not have Botox  if you are pregnant, nursing, allergic to albumin, have an infection, skin condition, or muscle weakness at the site of the injection, or have myasthenia  gravis, Lambert-Eaton syndrome, or ALS.  It is also possible that as with any injection, there may be an allergic reaction or no effect from the medication. Reduced effectiveness after repeated injections is sometimes seen and rarely infection at the injection site may occur. All care will be taken to prevent these side effects. If therapy is given over a long time, atrophy and wasting in the muscle injected may occur. Occasionally the patient's become refractory to treatment because they develop antibodies to the toxin. In this event, therapy needs to be modified.  I have read the above information and consent to the administration of botulism toxin.    BOTOX  PROCEDURE NOTE FOR MIGRAINE HEADACHE  Contraindications and precautions discussed with patient(above). Aseptic procedure was observed and patient tolerated procedure. Procedure performed by Johny Nap, AGNP-BC.   The condition has existed for more than 6 months, and pt does not have a diagnosis of ALS, Myasthenia Gravis or Lambert-Eaton Syndrome.  Risks and benefits of injections discussed and pt agrees to proceed with the procedure.  Written consent obtained  These injections are medically necessary. Pt  receives good benefits from these injections. These injections do not cause sedations or hallucinations which the oral therapies may cause.   Description of procedure:  The patient was placed in a sitting position. The standard protocol was used for Botox  as follows, with 5 units of Botox  injected at each site:  -Procerus muscle, midline injection  -Corrugator muscle, bilateral injection  -Frontalis muscle, bilateral injection, with 2 sites each side, medial injection was performed in the upper one third of the frontalis muscle, in  the region vertical from the medial inferior edge of the superior orbital rim. The lateral injection was again in the upper one third of the forehead vertically above the lateral limbus of the cornea,  1.5 cm lateral to the medial injection site.  -Temporalis muscle injection, 4 sites, bilaterally. The first injection was 3 cm above the tragus of the ear, second injection site was 1.5 cm to 3 cm up from the first injection site in line with the tragus of the ear. The third injection site was 1.5-3 cm forward between the first 2 injection sites. The fourth injection site was 1.5 cm posterior to the second injection site. 5th site laterally in the temporalis  muscleat the level of the outer canthus.  -Occipitalis muscle injection, 3 sites, bilaterally. The first injection was done one half way between the occipital protuberance and the tip of the mastoid process behind the ear. The second injection site was done lateral and superior to the first, 1 fingerbreadth from the first injection. The third injection site was 1 fingerbreadth superiorly and medially from the first injection site.  -Cervical paraspinal muscle injection, 2 sites, bilaterally. The first injection site was 1 cm from the midline of the cervical spine, 3 cm inferior to the lower border of the occipital protuberance. The second injection site was 1.5 cm superiorly and laterally to the first injection site.  -Trapezius muscle declined by patient    A total of 200 units of Botox  was prepared, 125 units of Botox  was injected as documented above, any Botox  not injected was wasted. The patient tolerated the procedure well, there were no complications of the above procedure.   Johny Nap, AGNP-BC  [facility] [address] [place], [place] [address]  Phone [phone] Fax [phone] Note: This document was prepared with digital dictation and possible smart phrase technology. Any transcriptional errors that result from this process are unintentional.

## 2023-08-26 ENCOUNTER — Other Ambulatory Visit: Payer: Self-pay | Admitting: Neurology

## 2023-08-26 ENCOUNTER — Encounter: Payer: Self-pay | Admitting: Neurology

## 2023-08-26 DIAGNOSIS — R519 Headache, unspecified: Secondary | ICD-10-CM

## 2023-08-26 DIAGNOSIS — G44019 Episodic cluster headache, not intractable: Secondary | ICD-10-CM

## 2023-08-26 DIAGNOSIS — G4733 Obstructive sleep apnea (adult) (pediatric): Secondary | ICD-10-CM
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
## 2023-10-16 NOTE — Telephone Encounter (Signed)
 Completed [employer] renewal form and faxed to [phone].

## 2023-10-22 NOTE — Telephone Encounter (Signed)
 shara: [phone] (10/16/23-10/14/24)

## 2023-11-14 ENCOUNTER — Ambulatory Visit: Admitting: Adult Health

## 2023-11-14 DIAGNOSIS — G43711 Chronic migraine without aura, intractable, with status migrainosus: Secondary | ICD-10-CM | POA: Diagnosis not present

## 2023-11-14 MED ORDER — EMGALITY 120 MG/ML ~~LOC~~ SOAJ: 120.0000 mg | Freq: Once | SUBCUTANEOUS | 0 refills | Status: AC

## 2023-11-14 MED ORDER — ONABOTULINUMTOXINA 200 UNITS IJ SOLR
155.0000 [IU] | Freq: Once | INTRAMUSCULAR | Status: AC
  Administered 2023-11-14: 125 [IU] via INTRAMUSCULAR

## 2023-11-14 MED ORDER — EMGALITY 120 MG/ML ~~LOC~~ SOAJ
120.0000 mg | SUBCUTANEOUS | 11 refills | Status: AC
Start: 2023-11-14 — End: ?

## 2023-11-14 MED ORDER — UBRELVY 100 MG PO TABS
100.0000 mg | ORAL_TABLET | ORAL | 11 refills | Status: AC | PRN
Start: 2023-11-14 — End: ?

## 2023-11-14 NOTE — Progress Notes (Signed)
 Update 11/14/2023 JM: Patient returns for repeat botox . Prior injection 08/20/2023.  Reports continued benefit with Botox  with >50% migraine reduction but can have wearing off 2-3 weeks prior to next injection. These migraines can be debilitating and making overall functioning difficult. Use of Ubrelvy  with benefit. She questions restarting Emgality  injection as previously beneficial. Will restart Emgality  monthly injection and will provide updated refill for Ubrelvy .   Declines upper trapezius muscle injections.  Return in 3 months for repeat injection.  Previously tried/failed: Topiramate , nortriptyline , atenolol, Emgality , nerve blocks, Ubrelvy         Consent Form Botulism Toxin Injection For Chronic Migraine    Reviewed orally with patient, additionally signature is on file:  Botulism toxin has been approved by the Federal drug administration for treatment of chronic migraine. Botulism toxin does not cure chronic migraine and it may not be effective in some patients.  The administration of botulism toxin is accomplished by injecting a small amount of toxin into the muscles of the neck and head. Dosage must be titrated for each individual. Any benefits resulting from botulism toxin tend to wear off after 3 months with a repeat injection required if benefit is to be maintained. Injections are usually done every 3-4 months with maximum effect peak achieved by about 2 or 3 weeks. Botulism toxin is expensive and you should be sure of what costs you will incur resulting from the injection.  The side effects of botulism toxin use for chronic migraine may include:   -Transient, and usually mild, facial weakness with facial injections  -Transient, and usually mild, head or neck weakness with head/neck injections  -Reduction or loss of forehead facial animation due to forehead muscle weakness  -Eyelid drooping  -Dry eye  -Pain at the site of injection or bruising at the site of  injection  -Double vision  -Potential unknown long term risks   Contraindications: You should not have Botox  if you are pregnant, nursing, allergic to albumin, have an infection, skin condition, or muscle weakness at the site of the injection, or have myasthenia gravis, Lambert-Eaton syndrome, or ALS.  It is also possible that as with any injection, there may be an allergic reaction or no effect from the medication. Reduced effectiveness after repeated injections is sometimes seen and rarely infection at the injection site may occur. All care will be taken to prevent these side effects. If therapy is given over a long time, atrophy and wasting in the muscle injected may occur. Occasionally the patient's become refractory to treatment because they develop antibodies to the toxin. In this event, therapy needs to be modified.  I have read the above information and consent to the administration of botulism toxin.    BOTOX  PROCEDURE NOTE FOR MIGRAINE HEADACHE  Contraindications and precautions discussed with patient(above). Aseptic procedure was observed and patient tolerated procedure. Procedure performed by Harlene Bogaert, AGNP-BC.   The condition has existed for more than 6 months, and pt does not have a diagnosis of ALS, Myasthenia Gravis or Lambert-Eaton Syndrome.  Risks and benefits of injections discussed and pt agrees to proceed with the procedure.  Written consent obtained  These injections are medically necessary. Pt  receives good benefits from these injections. These injections do not cause sedations or hallucinations which the oral therapies may cause.   Description of procedure:  The patient was placed in a sitting position. The standard protocol was used for Botox  as follows, with 5 units of Botox  injected at each site:  -Procerus  muscle, midline injection  -Corrugator muscle, bilateral injection  -Frontalis muscle, bilateral injection, with 2 sites each side, medial injection  was performed in the upper one third of the frontalis muscle, in the region vertical from the medial inferior edge of the superior orbital rim. The lateral injection was again in the upper one third of the forehead vertically above the lateral limbus of the cornea, 1.5 cm lateral to the medial injection site.  -Temporalis muscle injection, 4 sites, bilaterally. The first injection was 3 cm above the tragus of the ear, second injection site was 1.5 cm to 3 cm up from the first injection site in line with the tragus of the ear. The third injection site was 1.5-3 cm forward between the first 2 injection sites. The fourth injection site was 1.5 cm posterior to the second injection site. 5th site laterally in the temporalis  muscleat the level of the outer canthus.  -Occipitalis muscle injection, 3 sites, bilaterally. The first injection was done one half way between the occipital protuberance and the tip of the mastoid process behind the ear. The second injection site was done lateral and superior to the first, 1 fingerbreadth from the first injection. The third injection site was 1 fingerbreadth superiorly and medially from the first injection site.  -Cervical paraspinal muscle injection, 2 sites, bilaterally. The first injection site was 1 cm from the midline of the cervical spine, 3 cm inferior to the lower border of the occipital protuberance. The second injection site was 1.5 cm superiorly and laterally to the first injection site.  -Trapezius muscle declined by patient    A total of 200 units of Botox  was prepared, 125 units of Botox  was injected as documented above, any Botox  not injected was wasted. The patient tolerated the procedure well, there were no complications of the above procedure.   Harlene Bogaert, AGNP-BC  [facility] [address] [place], [place] [address]  Phone [phone] Fax [phone] Note: This document was prepared with digital  dictation and possible smart phrase technology. Any transcriptional errors that result from this process are unintentional.

## 2023-11-14 NOTE — Progress Notes (Signed)
 Botox - 200 units x 1 vial Lot: [id number] Expiration: 01/2026 NDC: 9976-6078-97  Bacteriostatic 0.9% Sodium Chloride - 4 mL  Lot: [id number] Expiration: 02/28/2025 NDC: 9590-8033-97  Dx: H56.288.  B/B Witnessed by Grayce Alexander RN

## 2023-11-22 ENCOUNTER — Telehealth: Payer: Self-pay

## 2023-11-22 NOTE — Telephone Encounter (Signed)
 List of medications prescribed by our office from 2022 - present mailed to patient at patients request.

## 2023-12-18 ENCOUNTER — Telehealth: Payer: Self-pay | Admitting: Neurology

## 2023-12-18 NOTE — Telephone Encounter (Signed)
 ..  Pt understands that although there may be some limitations with this type of visit, we will take all precautions to reduce any security or privacy concerns.  Pt understands that this will be treated like an in office visit and we will file with pt's insurance, and there may be a patient responsible charge related to this service. ? ?

## 2023-12-19 ENCOUNTER — Telehealth (INDEPENDENT_AMBULATORY_CARE_PROVIDER_SITE_OTHER): Payer: Self-pay | Admitting: Neurology

## 2023-12-19 ENCOUNTER — Encounter: Payer: Self-pay | Admitting: Neurology

## 2023-12-19 DIAGNOSIS — G43711 Chronic migraine without aura, intractable, with status migrainosus: Secondary | ICD-10-CM

## 2023-12-19 DIAGNOSIS — G500 Trigeminal neuralgia: Secondary | ICD-10-CM

## 2023-12-19 DIAGNOSIS — R519 Headache, unspecified: Secondary | ICD-10-CM

## 2023-12-19 DIAGNOSIS — R200 Anesthesia of skin: Secondary | ICD-10-CM

## 2023-12-19 MED ORDER — OXCARBAZEPINE ER 150 MG PO TB24: ORAL_TABLET | ORAL | 6 refills | Status: AC
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
## 2024-01-16 NOTE — Telephone Encounter (Signed)
 Called pt, she said she is aware of the issue with [employer] and will let me know once it's been resolved.

## 2024-01-29 ENCOUNTER — Other Ambulatory Visit

## 2024-02-13 ENCOUNTER — Other Ambulatory Visit

## 2024-02-13 ENCOUNTER — Ambulatory Visit: Payer: Self-pay | Admitting: Adult Health

## 2024-03-03 ENCOUNTER — Inpatient Hospital Stay: Admission: RE | Admit: 2024-03-03 | Source: Ambulatory Visit

## 2024-03-03 ENCOUNTER — Encounter: Payer: Self-pay | Admitting: Radiology

## 2024-03-03 ENCOUNTER — Telehealth: Payer: Self-pay | Admitting: Neurology

## 2024-03-03 NOTE — Telephone Encounter (Signed)
 Christy from [facility] called stating that pt had an MRI today , However Pt insurance is out of network with them . Bari wanted to informed Md so that Pt can be scheduled somewhere else .  Calllback  [phone] [phone]

## 2024-03-06 ENCOUNTER — Ambulatory Visit: Payer: Self-pay | Admitting: Adult Health

## 2024-03-10 NOTE — Telephone Encounter (Signed)
 I have only seen her for botox  with prior injection in July with good control of migraines. She was seen by Dr. Ines in August when  MRI order was placed for c/o facial pain with concern of trigeminal neuralgia. I have never seen her for this. As it has been over 3 months since then, she will need to be scheduled with new MD to establish care and this order can be redone at that time or request can be sent to work in doctor who may be able to reorder.

## 2024-03-10 NOTE — Telephone Encounter (Signed)
 Spoke with patient and scheduled appointment with Dr Dohmeier for 05/29/24 at 1:30pm

## 2024-05-07 ENCOUNTER — Other Ambulatory Visit: Payer: Self-pay | Admitting: Neurology

## 2024-05-07 ENCOUNTER — Telehealth: Payer: Self-pay | Admitting: Neurology

## 2024-05-07 ENCOUNTER — Other Ambulatory Visit: Payer: Self-pay

## 2024-05-07 ENCOUNTER — Other Ambulatory Visit (HOSPITAL_COMMUNITY): Payer: Self-pay

## 2024-05-07 DIAGNOSIS — R519 Headache, unspecified: Secondary | ICD-10-CM

## 2024-05-07 DIAGNOSIS — G44019 Episodic cluster headache, not intractable: Secondary | ICD-10-CM

## 2024-05-07 MED ORDER — BOTOX 200 UNITS IJ SOLR
INTRAMUSCULAR | 3 refills | Status: AC
  Filled 2024-05-07: qty 1, fill #0
  Filled 2024-05-08: qty 1, 84d supply, fill #0

## 2024-05-07 NOTE — Telephone Encounter (Signed)
 Done

## 2024-05-07 NOTE — Telephone Encounter (Signed)
 Called pt she presented [employer]. I submitted CMM auth and received approval. Please send rx to [facility], thank you!  Auth#: [id number] (05/07/24-05/07/25)

## 2024-05-07 NOTE — Telephone Encounter (Signed)
 I spoke to Mrs Guillermo today after preparing for a TOC from Dr Ines.  The patient has seen Harlene Bogaert in the interval.  She had undergone a HST last year , referred due to AM headaches and cluster headaches waking her out of sleep. She reports that she didn't discuss the treatment of her sleep disorder any further and no CPAP had been issued.   I will order CPAP today and hope she can still get the machine based on an 39 months- old HST.   AHI 29.3/h by AASM criteria.  Hypoxia was present, 70% o2 nadir and  17 minutes of hypoxia time,   ESS was 11/24, FSS at 45/ 63   I wrote for an autotitration device.

## 2024-05-07 NOTE — Addendum Note (Signed)
 Addended by: NEYSA PARTICIA RAMAN on: 05/07/2024 04:27 PM   Modules accepted: Orders

## 2024-05-08 ENCOUNTER — Other Ambulatory Visit: Payer: Self-pay

## 2024-05-08 ENCOUNTER — Other Ambulatory Visit (HOSPITAL_COMMUNITY): Payer: Self-pay

## 2024-05-08 NOTE — Progress Notes (Signed)
 Specialty Pharmacy Initial Fill Coordination Note  Sharon Friedman is a 54 y.o. female contacted today regarding initial fill of specialty medication(s) OnabotulinumtoxinA  (Botox )   Patient requested Courier to Provider Office   Delivery date: 05/12/24   Verified address: [facility], [address], [place], [place] [address]   Medication will be filled on 05/08/2024.   Patient is aware of $4.00 copayment.

## 2024-05-08 NOTE — Progress Notes (Signed)
 Initial fill has been scheduled in [place].

## 2024-05-09 ENCOUNTER — Other Ambulatory Visit: Payer: Self-pay

## 2024-05-13 ENCOUNTER — Encounter: Payer: Self-pay | Admitting: Adult Health

## 2024-05-13 ENCOUNTER — Ambulatory Visit (INDEPENDENT_AMBULATORY_CARE_PROVIDER_SITE_OTHER): Payer: Self-pay | Admitting: Adult Health

## 2024-05-13 VITALS — BP 158/85 | HR 63

## 2024-05-13 DIAGNOSIS — G43711 Chronic migraine without aura, intractable, with status migrainosus: Secondary | ICD-10-CM

## 2024-05-13 MED ORDER — ONABOTULINUMTOXINA 200 UNITS IJ SOLR
155.0000 [IU] | Freq: Once | INTRAMUSCULAR | Status: AC
  Administered 2024-05-13: 125 [IU] via INTRAMUSCULAR

## 2024-05-13 NOTE — Progress Notes (Signed)
 "   Update 05/13/2024 JM: Patient returns for repeat botox . Prior injection 11/14/2023.  Prolonged time in between injections due to insurance issues.  She has been experiencing increased migraine frequency especially over the past month as she is overdue.  Prior to Botox  wearing off, migraines well-controlled in combination of Botox  and Emgality .  Use of Ubrelvy  as needed with benefit.  Declines upper trapezius muscle injections.  Return in 3 months for repeat injection.  She is scheduled 05/30/2023 with Dr. Chalice for [facility] and ongoing management of migraines as previously followed by Dr. Ines        Consent Form Botulism Toxin Injection For Chronic Migraine    Reviewed orally with patient, additionally signature is on file:  Botulism toxin has been approved by the Federal drug administration for treatment of chronic migraine. Botulism toxin does not cure chronic migraine and it may not be effective in some patients.  The administration of botulism toxin is accomplished by injecting a small amount of toxin into the muscles of the neck and head. Dosage must be titrated for each individual. Any benefits resulting from botulism toxin tend to wear off after 3 months with a repeat injection required if benefit is to be maintained. Injections are usually done every 3-4 months with maximum effect peak achieved by about 2 or 3 weeks. Botulism toxin is expensive and you should be sure of what costs you will incur resulting from the injection.  The side effects of botulism toxin use for chronic migraine may include:   -Transient, and usually mild, facial weakness with facial injections  -Transient, and usually mild, head or neck weakness with head/neck injections  -Reduction or loss of forehead facial animation due to forehead muscle weakness  -Eyelid drooping  -Dry eye  -Pain at the site of injection or bruising at the site of injection  -Double vision  -Potential unknown long term  risks   Contraindications: You should not have Botox  if you are pregnant, nursing, allergic to albumin, have an infection, skin condition, or muscle weakness at the site of the injection, or have myasthenia gravis, Lambert-Eaton syndrome, or ALS.  It is also possible that as with any injection, there may be an allergic reaction or no effect from the medication. Reduced effectiveness after repeated injections is sometimes seen and rarely infection at the injection site may occur. All care will be taken to prevent these side effects. If therapy is given over a long time, atrophy and wasting in the muscle injected may occur. Occasionally the patient's become refractory to treatment because they develop antibodies to the toxin. In this event, therapy needs to be modified.  I have read the above information and consent to the administration of botulism toxin.    BOTOX  PROCEDURE NOTE FOR MIGRAINE HEADACHE  Contraindications and precautions discussed with patient(above). Aseptic procedure was observed and patient tolerated procedure. Procedure performed by Harlene Bogaert, AGNP-BC.   The condition has existed for more than 6 months, and pt does not have a diagnosis of ALS, Myasthenia Gravis or Lambert-Eaton Syndrome.  Risks and benefits of injections discussed and pt agrees to proceed with the procedure.  Written consent obtained  These injections are medically necessary. Pt  receives good benefits from these injections. These injections do not cause sedations or hallucinations which the oral therapies may cause.   Description of procedure:  The patient was placed in a sitting position. The standard protocol was used for Botox  as follows, with 5 units of Botox  injected at  each site:  -Procerus muscle, midline injection  -Corrugator muscle, bilateral injection  -Frontalis muscle, bilateral injection, with 2 sites each side, medial injection was performed in the upper one third of the frontalis  muscle, in the region vertical from the medial inferior edge of the superior orbital rim. The lateral injection was again in the upper one third of the forehead vertically above the lateral limbus of the cornea, 1.5 cm lateral to the medial injection site.  -Temporalis muscle injection, 4 sites, bilaterally. The first injection was 3 cm above the tragus of the ear, second injection site was 1.5 cm to 3 cm up from the first injection site in line with the tragus of the ear. The third injection site was 1.5-3 cm forward between the first 2 injection sites. The fourth injection site was 1.5 cm posterior to the second injection site. 5th site laterally in the temporalis  muscleat the level of the outer canthus.  -Occipitalis muscle injection, 3 sites, bilaterally. The first injection was done one half way between the occipital protuberance and the tip of the mastoid process behind the ear. The second injection site was done lateral and superior to the first, 1 fingerbreadth from the first injection. The third injection site was 1 fingerbreadth superiorly and medially from the first injection site.  -Cervical paraspinal muscle injection, 2 sites, bilaterally. The first injection site was 1 cm from the midline of the cervical spine, 3 cm inferior to the lower border of the occipital protuberance. The second injection site was 1.5 cm superiorly and laterally to the first injection site.  -Trapezius muscle declined by patient    A total of 200 units of Botox  was prepared, 125 units of Botox  was injected as documented above, any Botox  not injected was wasted. The patient tolerated the procedure well, there were no complications of the above procedure.   Harlene Bogaert, AGNP-BC  [facility] [address] [place], [place] [address]  Phone [phone] Fax [phone] Note: This document was prepared with digital dictation and possible smart phrase technology. Any  transcriptional errors that result from this process are unintentional.    "

## 2024-05-13 NOTE — Progress Notes (Signed)
 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

## 2024-05-29 ENCOUNTER — Ambulatory Visit: Payer: Self-pay | Admitting: Neurology

## 2024-06-18 ENCOUNTER — Ambulatory Visit: Payer: Self-pay | Admitting: Neurology

## 2024-08-12 ENCOUNTER — Ambulatory Visit: Admitting: Adult Health
